# Patient Record
Sex: Male | Born: 1950
Health system: Southern US, Community
[De-identification: ages and names within clinical notes are randomized; demographics above are authoritative.]

## PROBLEM LIST (undated history)

## (undated) DIAGNOSIS — E785 Hyperlipidemia, unspecified: Secondary | ICD-10-CM

## (undated) DIAGNOSIS — A159 Respiratory tuberculosis unspecified: Secondary | ICD-10-CM

## (undated) DIAGNOSIS — K219 Gastro-esophageal reflux disease without esophagitis: Secondary | ICD-10-CM

## (undated) DIAGNOSIS — F1910 Other psychoactive substance abuse, uncomplicated: Secondary | ICD-10-CM

## (undated) DIAGNOSIS — I2510 Atherosclerotic heart disease of native coronary artery without angina pectoris: Secondary | ICD-10-CM

## (undated) HISTORY — PX: NO PAST SURGERIES: SHX2092

## (undated) HISTORY — DX: Hyperlipidemia, unspecified: E78.5

## (undated) HISTORY — DX: Other psychoactive substance abuse, uncomplicated: F19.10

## (undated) HISTORY — DX: Gastro-esophageal reflux disease without esophagitis: K21.9

## (undated) HISTORY — DX: Respiratory tuberculosis unspecified: A15.9

---

## 1999-11-24 ENCOUNTER — Emergency Department (HOSPITAL_COMMUNITY): Admission: EM | Admit: 1999-11-24 | Discharge: 1999-11-24 | Payer: Self-pay | Admitting: Emergency Medicine

## 2013-07-17 ENCOUNTER — Emergency Department (HOSPITAL_COMMUNITY)
Admission: EM | Admit: 2013-07-17 | Discharge: 2013-07-17 | Disposition: A | Discharge status: Home / Self Care | Payer: Self-pay | Attending: Emergency Medicine | Admitting: Emergency Medicine

## 2013-07-17 ENCOUNTER — Encounter (HOSPITAL_COMMUNITY): Payer: Self-pay | Admitting: Emergency Medicine

## 2013-07-17 DIAGNOSIS — R369 Urethral discharge, unspecified: Secondary | ICD-10-CM | POA: Insufficient documentation

## 2013-07-17 DIAGNOSIS — Z880 Allergy status to penicillin: Secondary | ICD-10-CM | POA: Insufficient documentation

## 2013-07-17 MED ORDER — STERILE WATER FOR INJECTION IJ SOLN
INTRAMUSCULAR | Status: AC
  Administered 2013-07-17: 0.9 mL
  Filled 2013-07-17: qty 10

## 2013-07-17 MED ORDER — AZITHROMYCIN 250 MG PO TABS
1000.0000 mg | ORAL_TABLET | Freq: Once | ORAL | Status: AC
  Administered 2013-07-17: 1000 mg via ORAL
  Filled 2013-07-17: qty 4

## 2013-07-17 MED ORDER — CEFTRIAXONE SODIUM 250 MG IJ SOLR
250.0000 mg | Freq: Once | INTRAMUSCULAR | Status: AC
  Administered 2013-07-17: 250 mg via INTRAMUSCULAR
  Filled 2013-07-17: qty 250

## 2013-07-17 NOTE — ED Notes (Signed)
Pt here for penile discharge that was clear starting thursday

## 2013-07-17 NOTE — ED Provider Notes (Signed)
CSN: [id number]     Arrival date & time 07/17/13  1747 History  This chart was scribed for non-physician practitioner, Trixie Dredge, PA-C,working with Bonnita Levan. Bernette Mayers, MD, by Karle Plumber, ED Scribe.  This patient was seen in room TR11C/TR11C and the patient's care was started at 6:30 PM.  Chief Complaint  Patient presents with  . Exposure to STD   The history is provided by the patient. No language interpreter was used.   HPI Comments:  Peter Taylor is a 62 y.o. male who presents to the Emergency Department complaining of penile discharge he noticed upon going to the bathroom two days ago. He denies seeing any lesions or sores of the penis. Pt denies any testicular pain or swelling. He denies abdominal pain, nausea, vomiting or fever. Pt reports his last HIV test was approximately 4 months ago.  Does get regular STD and HIV tests.   History reviewed. No pertinent past medical history. History reviewed. No pertinent past surgical history. History reviewed. No pertinent family history. History  Substance Use Topics  . Smoking status: Never Smoker   . Smokeless tobacco: Not on file  . Alcohol Use: Yes     Comment: occ    Review of Systems  Constitutional: Negative for fever and chills.  Gastrointestinal: Negative for nausea, vomiting and abdominal pain.  Genitourinary: Positive for discharge. Negative for penile swelling, scrotal swelling, genital sores, penile pain and testicular pain.    Allergies  Penicillins  Home Medications  No current outpatient prescriptions on file. Triage Vitals: BP 137/86  Pulse 62  Temp(Src) 98.1 F (36.7 C) (Oral)  Resp 18  SpO2 99% Physical Exam  Nursing note and vitals reviewed. Constitutional: He appears well-developed and well-nourished. No distress.  HENT:  Head: Normocephalic and atraumatic.  Neck: Neck supple.  Pulmonary/Chest: Effort normal.  Genitourinary: Testes normal. Right testis shows no mass, no swelling and no  tenderness. Right testis is descended. Left testis shows no mass, no swelling and no tenderness. Left testis is descended. Uncircumcised. Discharge found.  Neurological: He is alert.  Skin: He is not diaphoretic.    ED Course  Procedures (including critical care time) DIAGNOSTIC STUDIES: Oxygen Saturation is 99% on RA, normal by my interpretation.   COORDINATION OF CARE: 6:35 PM- Will obtain labs and treat with antibiotics for possible gonorrhea exposure. Pt verbalizes understanding and agrees to plan.  Medications - No data to display  Labs Review Labs Reviewed - No data to display Imaging Review No results found.  MDM   1. Penile discharge     Pt with 2 days of penile discharge.  No other symptoms.  Pt chooses to undergo full STD panel and to be treated in ED for GC/Chlam.  He verbalizes understanding that all tests will be pending at discharge.  Pt given return precautions.  Pt verbalizes understanding and agrees with plan.     I personally performed the services described in this documentation, which was scribed in my presence. The recorded information has been reviewed and is accurate.    Trixie Dredge, PA-C 07/17/13 2045

## 2013-07-18 LAB — HIV ANTIBODY (ROUTINE TESTING W REFLEX): HIV: NONREACTIVE

## 2013-07-18 LAB — GC/CHLAMYDIA PROBE AMP: GC Probe RNA: NEGATIVE

## 2013-07-18 NOTE — ED Provider Notes (Signed)
Medical screening examination/treatment/procedure(s) were performed by non-physician practitioner and as supervising physician I was immediately available for consultation/collaboration.   Maurisio Ruddy B. Geoffrey Hynes, MD 07/18/13 1335 

## 2013-07-23 ENCOUNTER — Telehealth (HOSPITAL_COMMUNITY): Payer: Self-pay | Admitting: Emergency Medicine

## 2013-07-23 NOTE — ED Notes (Signed)
Pt called for lab results, verified ID by ss#. All test neg. He states he is still having some problems. Advised to follow up with PCP or return for further evaluation.

## 2019-11-03 ENCOUNTER — Encounter: Payer: Self-pay | Admitting: Family Medicine

## 2019-11-03 ENCOUNTER — Other Ambulatory Visit: Payer: Self-pay

## 2019-11-03 ENCOUNTER — Ambulatory Visit (INDEPENDENT_AMBULATORY_CARE_PROVIDER_SITE_OTHER): Payer: Medicare HMO | Admitting: Family Medicine

## 2019-11-03 ENCOUNTER — Ambulatory Visit (INDEPENDENT_AMBULATORY_CARE_PROVIDER_SITE_OTHER): Payer: Medicare HMO

## 2019-11-03 VITALS — BP 140/80 | HR 68 | Temp 98.2°F | Wt 195.8 lb

## 2019-11-03 DIAGNOSIS — R059 Cough, unspecified: Secondary | ICD-10-CM

## 2019-11-03 DIAGNOSIS — R0600 Dyspnea, unspecified: Secondary | ICD-10-CM

## 2019-11-03 DIAGNOSIS — R12 Heartburn: Secondary | ICD-10-CM | POA: Diagnosis not present

## 2019-11-03 DIAGNOSIS — R079 Chest pain, unspecified: Secondary | ICD-10-CM | POA: Diagnosis not present

## 2019-11-03 DIAGNOSIS — R1084 Generalized abdominal pain: Secondary | ICD-10-CM | POA: Diagnosis not present

## 2019-11-03 DIAGNOSIS — R05 Cough: Secondary | ICD-10-CM | POA: Diagnosis not present

## 2019-11-03 DIAGNOSIS — R0609 Other forms of dyspnea: Secondary | ICD-10-CM

## 2019-11-03 DIAGNOSIS — R0789 Other chest pain: Secondary | ICD-10-CM | POA: Diagnosis not present

## 2019-11-03 MED ORDER — OMEPRAZOLE 20 MG PO CPDR: 20.0000 mg | DELAYED_RELEASE_CAPSULE | Freq: Every day | ORAL | 1 refills | Status: DC

## 2019-11-03 NOTE — Patient Instructions (Addendum)
For heartburn symptoms, see information below on foods to avoid.  Start omeprazole once per day.  Avoid exertional exercise at this time until we evaluate your chest symptoms further.  Tylenol is okay if needed for chest wall pain.  See other information below.   If any acute worsening of symptoms call 911 or go to emergency room.  Recheck in 1 week to review labs and discuss next step.  I did refer you to cardiology to discuss your chest symptoms and shortness of breath as well. Return to the clinic or go to the nearest emergency room if any of your symptoms worsen or new symptoms occur.   Food Choices for Gastroesophageal Reflux Disease, Adult When you have gastroesophageal reflux disease (GERD), the foods you eat and your eating habits are very important. Choosing the right foods can help ease your discomfort. Think about working with a nutrition specialist (dietitian) to help you make good choices. What are tips for following this plan?  Meals  Choose healthy foods that are low in fat, such as fruits, vegetables, whole grains, low-fat dairy products, and lean meat, fish, and poultry.  Eat small meals often instead of 3 large meals a day. Eat your meals slowly, and in a place where you are relaxed. Avoid bending over or lying down until 2-3 hours after eating.  Avoid eating meals 2-3 hours before bed.  Avoid drinking a lot of liquid with meals.  Cook foods using methods other than frying. Bake, grill, or broil food instead.  Avoid or limit: ? Chocolate. ? Peppermint or spearmint. ? Alcohol. ? Pepper. ? Black and decaffeinated coffee. ? Black and decaffeinated tea. ? Bubbly (carbonated) soft drinks. ? Caffeinated energy drinks and soft drinks.  Limit high-fat foods such as: ? Fatty meat or fried foods. ? Whole milk, cream, butter, or ice cream. ? Nuts and nut butters. ? Pastries, donuts, and sweets made with butter or shortening.  Avoid foods that cause symptoms. These foods  may be different for everyone. Common foods that cause symptoms include: ? Tomatoes. ? Oranges, lemons, and limes. ? Peppers. ? Spicy food. ? Onions and garlic. ? Vinegar. Lifestyle  Maintain a healthy weight. Ask your doctor what weight is healthy for you. If you need to lose weight, work with your doctor to do so safely.  Exercise for at least 30 minutes for 5 or more days each week, or as told by your doctor.  Wear loose-fitting clothes.  Do not smoke. If you need help quitting, ask your doctor.  Sleep with the head of your bed higher than your feet. Use a wedge under the mattress or blocks under the bed frame to raise the head of the bed. Summary  When you have gastroesophageal reflux disease (GERD), food and lifestyle choices are very important in easing your symptoms.  Eat small meals often instead of 3 large meals a day. Eat your meals slowly, and in a place where you are relaxed.  Limit high-fat foods such as fatty meat or fried foods.  Avoid bending over or lying down until 2-3 hours after eating.  Avoid peppermint and spearmint, caffeine, alcohol, and chocolate. This information is not intended to replace advice given to you by your health care provider. Make sure you discuss any questions you have with your health care provider. Document Revised: 02/04/2019 Document Reviewed: 11/19/2016 Elsevier Patient Education  [place].  Nonspecific Chest Pain Chest pain can be caused by many different conditions. Some causes of chest  pain can be life-threatening. These will require treatment right away. Serious causes of chest pain include:  Heart attack.  A tear in the body's main blood vessel.  Redness and swelling (inflammation) around your heart.  Blood clot in your lungs. Other causes of chest pain may not be so serious. These include:  Heartburn.  Anxiety or stress.  Damage to bones or muscles in your chest.  Lung infections. Chest pain can feel  like:  Pain or discomfort in your chest.  Crushing, pressure, aching, or squeezing pain.  Burning or tingling.  Dull or sharp pain that is worse when you move, cough, or take a deep breath.  Pain or discomfort that is also felt in your back, neck, jaw, shoulder, or arm, or pain that spreads to any of these areas. It is hard to know whether your pain is caused by something that is serious or something that is not so serious. So it is important to see your doctor right away if you have chest pain. Follow these instructions at home: Medicines  Take over-the-counter and prescription medicines only as told by your doctor.  If you were prescribed an antibiotic medicine, take it as told by your doctor. Do not stop taking the antibiotic even if you start to feel better. Lifestyle   Rest as told by your doctor.  Do not use any products that contain nicotine or tobacco, such as cigarettes, e-cigarettes, and chewing tobacco. If you need help quitting, ask your doctor.  Do not drink alcohol.  Make lifestyle changes as told by your doctor. These may include: ? Getting regular exercise. Ask your doctor what activities are safe for you. ? Eating a heart-healthy diet. A diet and nutrition specialist (dietitian) can help you to learn healthy eating options. ? Staying at a healthy weight. ? Treating diabetes or high blood pressure, if needed. ? Lowering your stress. Activities such as yoga and relaxation techniques can help. General instructions  Pay attention to any changes in your symptoms. Tell your doctor about them or any new symptoms.  Avoid any activities that cause chest pain.  Keep all follow-up visits as told by your doctor. This is important. You may need more testing if your chest pain does not go away. Contact a doctor if:  Your chest pain does not go away.  You feel depressed.  You have a fever. Get help right away if:  Your chest pain is worse.  You have a cough that  gets worse, or you cough up blood.  You have very bad (severe) pain in your belly (abdomen).  You pass out (faint).  You have either of these for no clear reason: ? Sudden chest discomfort. ? Sudden discomfort in your arms, back, neck, or jaw.  You have shortness of breath at any time.  You suddenly start to sweat, or your skin gets clammy.  You feel sick to your stomach (nauseous).  You throw up (vomit).  You suddenly feel lightheaded or dizzy.  You feel very weak or tired.  Your heart starts to beat fast, or it feels like it is skipping beats. These symptoms may be an emergency. Do not wait to see if the symptoms will go away. Get medical help right away. Call your local emergency services (911 in the [place] Do not drive yourself to the hospital. Summary  Chest pain can be caused by many different conditions. The cause may be serious and need treatment right away. If you have chest pain, see  your doctor right away.  Follow your doctor's instructions for taking medicines and making lifestyle changes.  Keep all follow-up visits as told by your doctor. This includes visits for any further testing if your chest pain does not go away.  Be sure to know the signs that show that your condition has become worse. Get help right away if you have these symptoms. This information is not intended to replace advice given to you by your health care provider. Make sure you discuss any questions you have with your health care provider. Document Revised: 04/16/2018 Document Reviewed: 04/16/2018 Elsevier Patient Education  [place].  Chest Wall Pain Chest wall pain is pain in or around the bones and muscles of your chest. Sometimes, an injury causes this pain. Excessive coughing or overuse of arm and chest muscles may also cause chest wall pain. Sometimes, the cause may not be known. This pain may take several weeks or longer to get better. Follow these instructions at home: Managing  pain, stiffness, and swelling   If directed, put ice on the painful area: ? Put ice in a plastic bag. ? Place a towel between your skin and the bag. ? Leave the ice on for 20 minutes, 2-3 times per day. Activity  Rest as told by your health care provider.  Avoid activities that cause pain. These include any activities that use your chest muscles or your abdominal and side muscles to lift heavy items. Ask your health care provider what activities are safe for you. General instructions   Take over-the-counter and prescription medicines only as told by your health care provider.  Do not use any products that contain nicotine or tobacco, such as cigarettes, e-cigarettes, and chewing tobacco. These can delay healing after injury. If you need help quitting, ask your health care provider.  Keep all follow-up visits as told by your health care provider. This is important. Contact a health care provider if:  You have a fever.  Your chest pain becomes worse.  You have new symptoms. Get help right away if:  You have nausea or vomiting.  You feel sweaty or light-headed.  You have a cough with mucus from your lungs (sputum) or you cough up blood.  You develop shortness of breath. These symptoms may represent a serious problem that is an emergency. Do not wait to see if the symptoms will go away. Get medical help right away. Call your local emergency services (911 in the [place] Do not drive yourself to the hospital. Summary  Chest wall pain is pain in or around the bones and muscles of your chest.  Depending on the cause, it may be treated with ice, rest, medicines, and avoiding activities that cause pain.  Contact a health care provider if you have a fever, worsening chest pain, or new symptoms.  Get help right away if you feel light-headed or you develop shortness of breath. These symptoms may be an emergency. This information is not intended to replace advice given to you by your  health care provider. Make sure you discuss any questions you have with your health care provider. Document Revised: 04/16/2018 Document Reviewed: 04/16/2018 Elsevier Patient Education  [employer].   If you have lab work done today you will be contacted with your lab results within the next 2 weeks.  If you have not heard from [place] then please contact us. The fastest way to get your results is to register for My Chart.   IF you received  an x-ray today, you will receive an invoice from [facility]. Please contact [facility] at [phone] with questions or concerns regarding your invoice.   IF you received labwork today, you will receive an invoice from [facility]. Please contact [facility] at [phone] with questions or concerns regarding your invoice.   Our billing staff will not be able to assist you with questions regarding bills from these companies.  You will be contacted with the lab results as soon as they are available. The fastest way to get your results is to activate your My Chart account. Instructions are located on the last page of this paperwork. If you have not heard from [place] regarding the results in 2 weeks, please contact this office.

## 2019-11-03 NOTE — Progress Notes (Signed)
Subjective:  Patient ID: Peter Taylor, male    DOB: 1951-10-17  Age: 69 y.o. MRN: [id number]  CC:  Chief Complaint  Patient presents with  . Establish Care    est care and to talk about the acid reflex I have been having.Been going on for 3 weeks now. Started taking otc omeprazol and have not been having any problems but just want to mention this. Patient only want the hiv amd hep if he have to have labs done today    HPI Peter Taylor presents for   Establish care and reflux/heartburn  Heartburn/reflux: Past month noted more, episodic in past. Persistent past month. Sore on front of chest to left side of chest. Increased belching- notes after antacid tablet - rolaids - usually about 3 per day.  Notes problem with any food or liquid - sore moving to left chest area after eating/drinking. No chest pressure. No pain with exertion.  Some DOE - month ago - woke up short of breath in the middle of night, no recurrence. Some DOE at times with activity at work, like operating hand crank or bending down, not every time.  No associated sweating, but lightheaded with DOE at times.  Rare cough - occasional clear mucus. No known hx of COPD, asthma, CAD.  Dyspnea if lying on left side only.  No dark stools/BRBPR.   Quit smoking 7 years ago (about 1/2ppd for 54 years - 27pack yr hx).  Alcohol: beer on holidays only - 2-3 per month. No recent increase - less alcohol.  Truck driver - local.  No calf pain/swelling, no hx of DVT/PE.   No new urinary symptoms.  He has taken over-the-counter prostate supplement for the past 3 years.   History There are no problems to display for this patient.  History reviewed. No pertinent past medical history. History reviewed. No pertinent surgical history. Allergies  Allergen Reactions  . Penicillins Hives   Prior to Admission medications   Not on File   Social History   Socioeconomic History  . Marital status: Single    Spouse name: Not on  file  . Number of children: Not on file  . Years of education: Not on file  . Highest education level: Not on file  Occupational History  . Not on file  Tobacco Use  . Smoking status: Former Research scientist (life sciences)  . Smokeless tobacco: Former Network engineer and Sexual Activity  . Alcohol use: Yes    Comment: occ  . Drug use: No  . Sexual activity: Not on file  Other Topics Concern  . Not on file  Social History Narrative  . Not on file   Social Determinants of Health   Financial Resource Strain:   . Difficulty of Paying Living Expenses: Not on file  Food Insecurity:   . Worried About Charity fundraiser in the Last Year: Not on file  . Ran Out of Food in the Last Year: Not on file  Transportation Needs:   . Lack of Transportation (Medical): Not on file  . Lack of Transportation (Non-Medical): Not on file  Physical Activity:   . Days of Exercise per Week: Not on file  . Minutes of Exercise per Session: Not on file  Stress:   . Feeling of Stress : Not on file  Social Connections:   . Frequency of Communication with Friends and Family: Not on file  . Frequency of Social Gatherings with Friends and Family: Not on file  .  Attends Religious Services: Not on file  . Active Member of Clubs or Organizations: Not on file  . Attends Archivist Meetings: Not on file  . Marital Status: Not on file  Intimate Partner Violence:   . Fear of Current or Ex-Partner: Not on file  . Emotionally Abused: Not on file  . Physically Abused: Not on file  . Sexually Abused: Not on file    Review of Systems  Constitutional: Negative for diaphoresis, fever and unexpected weight change.  Respiratory: Positive for cough and shortness of breath.   Cardiovascular: Positive for chest pain. Negative for leg swelling (no calf pain/swelling ).   Other per HPI  Objective:   Vitals:   11/03/19 1324 11/03/19 1331  BP: (!) 141/85 140/80  Pulse: 68   Temp: 98.2 F (36.8 C)   TempSrc: Temporal   SpO2:  97%   Weight: 195 lb 12.8 oz (88.8 kg)      Physical Exam Vitals reviewed.  Constitutional:      Appearance: He is well-developed.  HENT:     Head: Normocephalic and atraumatic.  Eyes:     Pupils: Pupils are equal, round, and reactive to light.  Neck:     Vascular: No carotid bruit or JVD.  Cardiovascular:     Rate and Rhythm: Normal rate and regular rhythm.     Heart sounds: Normal heart sounds. No murmur.  Pulmonary:     Effort: Pulmonary effort is normal. No respiratory distress.     Breath sounds: Normal breath sounds. No wheezing or rales.  Chest:     Chest wall: Tenderness present.    Abdominal:     General: Abdomen is flat.     Palpations: Abdomen is soft.     Tenderness: There is abdominal tenderness (Minimal/slight tenderness diffusely, no rebound/guarding.). There is no guarding or rebound.  Skin:    General: Skin is warm and dry.  Neurological:     Mental Status: He is alert and oriented to person, place, and time.   DG Chest 2 View  Result Date: 11/03/2019 CLINICAL DATA:  Dyspnea on exertion, anterior chest wall pain EXAM: CHEST - 2 VIEW COMPARISON:  None. FINDINGS: The heart size and mediastinal contours are within normal limits. Both lungs are clear. The visualized skeletal structures are unremarkable. IMPRESSION: No acute abnormality of the lungs. No radiographic findings to explain the anterior chest wall pain. Electronically Signed   By: Giovonnie Candle M.D.   On: 11/03/2019 14:26   EKG:  Sinus bradycardia. First degree AV block. PR 220. No prior comparison.   Assessment & Plan:  Peter Taylor is a 69 y.o. male . Chest wall pain - Plan: Ambulatory referral to Cardiology Chest pain, unspecified type - Plan: EKG 12-Lead, Ambulatory referral to Cardiology PND (paroxysmal nocturnal dyspnea) - Plan: EKG 12-Lead Cough - Plan: DG Chest 2 View DOE (dyspnea on exertion) - Plan: EKG 12-Lead, DG Chest 2 View, Pro b natriuretic peptide, Ambulatory referral to  Cardiology Heartburn - Plan: omeprazole (PRILOSEC) 20 MG capsule   - atypical chest symptoms as well as chest wall component. Single episode of PND. No acute EKG findings. Reassuring CXR. May have GERD component as well.   - trigger avoidance for GERD., start omeprazole.   - refer to cardiology.   -- avoid exertion for now until cardiology eval. Er/rtc chest pain  precautions.   Generalized abdominal pain - Plan: CBC, Comprehensive metabolic panel, Lipase  - afebrile. Check labs above, start omeprazole.  recheck 1 week, sooner or to ER if worse.   Meds ordered this encounter  Medications  . omeprazole (PRILOSEC) 20 MG capsule    Sig: Take 1 capsule (20 mg total) by mouth daily.    Dispense:  30 capsule    Refill:  1   Patient Instructions    For heartburn symptoms, see information below on foods to avoid.  Start omeprazole once per day.  Avoid exertional exercise at this time until we evaluate your chest symptoms further.  Tylenol is okay if needed for chest wall pain.  See other information below.   If any acute worsening of symptoms call 911 or go to emergency room.  Recheck in 1 week to review labs and discuss next step.  I did refer you to cardiology to discuss your chest symptoms and shortness of breath as well. Return to the clinic or go to the nearest emergency room if any of your symptoms worsen or new symptoms occur.   Food Choices for Gastroesophageal Reflux Disease, Adult When you have gastroesophageal reflux disease (GERD), the foods you eat and your eating habits are very important. Choosing the right foods can help ease your discomfort. Think about working with a nutrition specialist (dietitian) to help you make good choices. What are tips for following this plan?  Meals  Choose healthy foods that are low in fat, such as fruits, vegetables, whole grains, low-fat dairy products, and lean meat, fish, and poultry.  Eat small meals often instead of 3 large meals a day. Eat  your meals slowly, and in a place where you are relaxed. Avoid bending over or lying down until 2-3 hours after eating.  Avoid eating meals 2-3 hours before bed.  Avoid drinking a lot of liquid with meals.  Cook foods using methods other than frying. Bake, grill, or broil food instead.  Avoid or limit: ? Chocolate. ? Peppermint or spearmint. ? Alcohol. ? Pepper. ? Black and decaffeinated coffee. ? Black and decaffeinated tea. ? Bubbly (carbonated) soft drinks. ? Caffeinated energy drinks and soft drinks.  Limit high-fat foods such as: ? Fatty meat or fried foods. ? Whole milk, cream, butter, or ice cream. ? Nuts and nut butters. ? Pastries, donuts, and sweets made with butter or shortening.  Avoid foods that cause symptoms. These foods may be different for everyone. Common foods that cause symptoms include: ? Tomatoes. ? Oranges, lemons, and limes. ? Peppers. ? Spicy food. ? Onions and garlic. ? Vinegar. Lifestyle  Maintain a healthy weight. Ask your doctor what weight is healthy for you. If you need to lose weight, work with your doctor to do so safely.  Exercise for at least 30 minutes for 5 or more days each week, or as told by your doctor.  Wear loose-fitting clothes.  Do not smoke. If you need help quitting, ask your doctor.  Sleep with the head of your bed higher than your feet. Use a wedge under the mattress or blocks under the bed frame to raise the head of the bed. Summary  When you have gastroesophageal reflux disease (GERD), food and lifestyle choices are very important in easing your symptoms.  Eat small meals often instead of 3 large meals a day. Eat your meals slowly, and in a place where you are relaxed.  Limit high-fat foods such as fatty meat or fried foods.  Avoid bending over or lying down until 2-3 hours after eating.  Avoid peppermint and spearmint, caffeine, alcohol, and chocolate. This information  is not intended to replace advice given to  you by your health care provider. Make sure you discuss any questions you have with your health care provider. Document Revised: 02/04/2019 Document Reviewed: 11/19/2016 Elsevier Patient Education  [place].  Nonspecific Chest Pain Chest pain can be caused by many different conditions. Some causes of chest pain can be life-threatening. These will require treatment right away. Serious causes of chest pain include:  Heart attack.  A tear in the body's main blood vessel.  Redness and swelling (inflammation) around your heart.  Blood clot in your lungs. Other causes of chest pain may not be so serious. These include:  Heartburn.  Anxiety or stress.  Damage to bones or muscles in your chest.  Lung infections. Chest pain can feel like:  Pain or discomfort in your chest.  Crushing, pressure, aching, or squeezing pain.  Burning or tingling.  Dull or sharp pain that is worse when you move, cough, or take a deep breath.  Pain or discomfort that is also felt in your back, neck, jaw, shoulder, or arm, or pain that spreads to any of these areas. It is hard to know whether your pain is caused by something that is serious or something that is not so serious. So it is important to see your doctor right away if you have chest pain. Follow these instructions at home: Medicines  Take over-the-counter and prescription medicines only as told by your doctor.  If you were prescribed an antibiotic medicine, take it as told by your doctor. Do not stop taking the antibiotic even if you start to feel better. Lifestyle   Rest as told by your doctor.  Do not use any products that contain nicotine or tobacco, such as cigarettes, e-cigarettes, and chewing tobacco. If you need help quitting, ask your doctor.  Do not drink alcohol.  Make lifestyle changes as told by your doctor. These may include: ? Getting regular exercise. Ask your doctor what activities are safe for you. ? Eating a  heart-healthy diet. A diet and nutrition specialist (dietitian) can help you to learn healthy eating options. ? Staying at a healthy weight. ? Treating diabetes or high blood pressure, if needed. ? Lowering your stress. Activities such as yoga and relaxation techniques can help. General instructions  Pay attention to any changes in your symptoms. Tell your doctor about them or any new symptoms.  Avoid any activities that cause chest pain.  Keep all follow-up visits as told by your doctor. This is important. You may need more testing if your chest pain does not go away. Contact a doctor if:  Your chest pain does not go away.  You feel depressed.  You have a fever. Get help right away if:  Your chest pain is worse.  You have a cough that gets worse, or you cough up blood.  You have very bad (severe) pain in your belly (abdomen).  You pass out (faint).  You have either of these for no clear reason: ? Sudden chest discomfort. ? Sudden discomfort in your arms, back, neck, or jaw.  You have shortness of breath at any time.  You suddenly start to sweat, or your skin gets clammy.  You feel sick to your stomach (nauseous).  You throw up (vomit).  You suddenly feel lightheaded or dizzy.  You feel very weak or tired.  Your heart starts to beat fast, or it feels like it is skipping beats. These symptoms may be an emergency. Do not  wait to see if the symptoms will go away. Get medical help right away. Call your local emergency services (911 in the [place] Do not drive yourself to the hospital. Summary  Chest pain can be caused by many different conditions. The cause may be serious and need treatment right away. If you have chest pain, see your doctor right away.  Follow your doctor's instructions for taking medicines and making lifestyle changes.  Keep all follow-up visits as told by your doctor. This includes visits for any further testing if your chest pain does not go  away.  Be sure to know the signs that show that your condition has become worse. Get help right away if you have these symptoms. This information is not intended to replace advice given to you by your health care provider. Make sure you discuss any questions you have with your health care provider. Document Revised: 04/16/2018 Document Reviewed: 04/16/2018 Elsevier Patient Education  [place].  Chest Wall Pain Chest wall pain is pain in or around the bones and muscles of your chest. Sometimes, an injury causes this pain. Excessive coughing or overuse of arm and chest muscles may also cause chest wall pain. Sometimes, the cause may not be known. This pain may take several weeks or longer to get better. Follow these instructions at home: Managing pain, stiffness, and swelling   If directed, put ice on the painful area: ? Put ice in a plastic bag. ? Place a towel between your skin and the bag. ? Leave the ice on for 20 minutes, 2-3 times per day. Activity  Rest as told by your health care provider.  Avoid activities that cause pain. These include any activities that use your chest muscles or your abdominal and side muscles to lift heavy items. Ask your health care provider what activities are safe for you. General instructions   Take over-the-counter and prescription medicines only as told by your health care provider.  Do not use any products that contain nicotine or tobacco, such as cigarettes, e-cigarettes, and chewing tobacco. These can delay healing after injury. If you need help quitting, ask your health care provider.  Keep all follow-up visits as told by your health care provider. This is important. Contact a health care provider if:  You have a fever.  Your chest pain becomes worse.  You have new symptoms. Get help right away if:  You have nausea or vomiting.  You feel sweaty or light-headed.  You have a cough with mucus from your lungs (sputum) or you cough  up blood.  You develop shortness of breath. These symptoms may represent a serious problem that is an emergency. Do not wait to see if the symptoms will go away. Get medical help right away. Call your local emergency services (911 in the [place] Do not drive yourself to the hospital. Summary  Chest wall pain is pain in or around the bones and muscles of your chest.  Depending on the cause, it may be treated with ice, rest, medicines, and avoiding activities that cause pain.  Contact a health care provider if you have a fever, worsening chest pain, or new symptoms.  Get help right away if you feel light-headed or you develop shortness of breath. These symptoms may be an emergency. This information is not intended to replace advice given to you by your health care provider. Make sure you discuss any questions you have with your health care provider. Document Revised: 04/16/2018 Document Reviewed: 04/16/2018 Elsevier Patient  Education  [employer].   If you have lab work done today you will be contacted with your lab results within the next 2 weeks.  If you have not heard from [place] then please contact us. The fastest way to get your results is to register for My Chart.   IF you received an x-ray today, you will receive an invoice from [facility]. Please contact [facility] at [phone] with questions or concerns regarding your invoice.   IF you received labwork today, you will receive an invoice from [place]. Please contact [facility] at [phone] with questions or concerns regarding your invoice.   Our billing staff will not be able to assist you with questions regarding bills from these companies.  You will be contacted with the lab results as soon as they are available. The fastest way to get your results is to activate your My Chart account. Instructions are located on the last page of this paperwork. If you have not heard from [place] regarding the results in 2  weeks, please contact this office.          Signed, Merri Ray, MD [facility]

## 2019-11-04 LAB — COMPREHENSIVE METABOLIC PANEL
ALT: 31 IU/L (ref 0–44)
AST: 17 IU/L (ref 0–40)
Albumin/Globulin Ratio: 1.7 (ref 1.2–2.2)
Albumin: 4.5 g/dL (ref 3.8–4.8)
Alkaline Phosphatase: 110 IU/L (ref 39–117)
BUN/Creatinine Ratio: 15 (ref 10–24)
BUN: 17 mg/dL (ref 8–27)
Bilirubin Total: 0.3 mg/dL (ref 0.0–1.2)
CO2: 25 mmol/L (ref 20–29)
Calcium: 9.8 mg/dL (ref 8.6–10.2)
Chloride: 105 mmol/L (ref 96–106)
Creatinine, Ser: 1.14 mg/dL (ref 0.76–1.27)
GFR calc Af Amer: 77 mL/min/{1.73_m2} (ref >59)
GFR calc non Af Amer: 67 mL/min/{1.73_m2} (ref >59)
Globulin, Total: 2.7 g/dL (ref 1.5–4.5)
Glucose: 107 mg/dL — ABNORMAL HIGH (ref 65–99)
Potassium: 4.2 mmol/L (ref 3.5–5.2)
Sodium: 145 mmol/L — ABNORMAL HIGH (ref 134–144)
Total Protein: 7.2 g/dL (ref 6.0–8.5)

## 2019-11-04 LAB — CBC
Hematocrit: 45.5 % (ref 37.5–51.0)
Hemoglobin: 15.4 g/dL (ref 13.0–17.7)
MCH: 29.6 pg (ref 26.6–33.0)
MCHC: 33.8 g/dL (ref 31.5–35.7)
MCV: 88 fL (ref 79–97)
Platelets: 267 10*3/uL (ref 150–450)
RBC: 5.2 x10E6/uL (ref 4.14–5.80)
RDW: 14.6 % (ref 11.6–15.4)
WBC: 5.5 10*3/uL (ref 3.4–10.8)

## 2019-11-04 LAB — LIPASE: Lipase: 102 U/L — ABNORMAL HIGH (ref 13–78)

## 2019-11-04 LAB — PRO B NATRIURETIC PEPTIDE: NT-Pro BNP: <5 pg/mL (ref 0–376)

## 2019-11-05 ENCOUNTER — Encounter: Payer: Self-pay | Admitting: Family Medicine

## 2019-11-10 ENCOUNTER — Ambulatory Visit (INDEPENDENT_AMBULATORY_CARE_PROVIDER_SITE_OTHER): Payer: Medicare HMO | Admitting: Family Medicine

## 2019-11-10 ENCOUNTER — Other Ambulatory Visit: Payer: Self-pay

## 2019-11-10 ENCOUNTER — Encounter: Payer: Self-pay | Admitting: Family Medicine

## 2019-11-10 VITALS — BP 131/85 | HR 52 | Temp 97.6°F | Ht 71.0 in | Wt 194.0 lb

## 2019-11-10 DIAGNOSIS — R12 Heartburn: Secondary | ICD-10-CM | POA: Diagnosis not present

## 2019-11-10 DIAGNOSIS — Z125 Encounter for screening for malignant neoplasm of prostate: Secondary | ICD-10-CM | POA: Diagnosis not present

## 2019-11-10 DIAGNOSIS — R1084 Generalized abdominal pain: Secondary | ICD-10-CM

## 2019-11-10 DIAGNOSIS — K625 Hemorrhage of anus and rectum: Secondary | ICD-10-CM

## 2019-11-10 DIAGNOSIS — R0789 Other chest pain: Secondary | ICD-10-CM | POA: Diagnosis not present

## 2019-11-10 DIAGNOSIS — R3989 Other symptoms and signs involving the genitourinary system: Secondary | ICD-10-CM | POA: Diagnosis not present

## 2019-11-10 DIAGNOSIS — R748 Abnormal levels of other serum enzymes: Secondary | ICD-10-CM | POA: Diagnosis not present

## 2019-11-10 DIAGNOSIS — Z8042 Family history of malignant neoplasm of prostate: Secondary | ICD-10-CM | POA: Diagnosis not present

## 2019-11-10 DIAGNOSIS — Z13220 Encounter for screening for lipoid disorders: Secondary | ICD-10-CM | POA: Diagnosis not present

## 2019-11-10 DIAGNOSIS — R351 Nocturia: Secondary | ICD-10-CM

## 2019-11-10 DIAGNOSIS — Z1211 Encounter for screening for malignant neoplasm of colon: Secondary | ICD-10-CM

## 2019-11-10 LAB — IFOBT (OCCULT BLOOD): IFOBT: NEGATIVE

## 2019-11-10 NOTE — Patient Instructions (Addendum)
  I will recheck lipase or pancreas test.  If any worsening abdominal pain, fevers, nausea or vomiting be seen in the emergency room.  Keep follow-up with cardiology as planned  I will refer you to urology to evaluate the prostate symptoms and for repeat check of the prostate exam as there is an area that feels enlarged/firm.  I am checking a PSA level as well today.  I will refer you to gastroenterology.  Continue omeprazole once per day for now, recheck 1 month.  Return to the clinic or go to the nearest emergency room if any of your symptoms worsen or new symptoms occur.    If you have lab work done today you will be contacted with your lab results within the next 2 weeks.  If you have not heard from [place] then please contact us. The fastest way to get your results is to register for My Chart.   IF you received an x-ray today, you will receive an invoice from [facility]. Please contact [facility] at [phone] with questions or concerns regarding your invoice.   IF you received labwork today, you will receive an invoice from [place]. Please contact [facility] at [phone] with questions or concerns regarding your invoice.   Our billing staff will not be able to assist you with questions regarding bills from these companies.  You will be contacted with the lab results as soon as they are available. The fastest way to get your results is to activate your My Chart account. Instructions are located on the last page of this paperwork. If you have not heard from [place] regarding the results in 2 weeks, please contact this office.

## 2019-11-10 NOTE — Progress Notes (Signed)
Subjective:  Patient ID: Peter Taylor, male    DOB: Aug 13, 1951  Age: 69 y.o. MRN: [id number]  CC:  Chief Complaint  Patient presents with  . Follow-up    from last weeks visit. pt states he has had a big improvment since last visit. pt still has a little pain from acid refulx but not as bad as it was, no chest pain. pt has appt with cardiology tomorrow.  . Referral    pt needs a referral to GI for colonoscopy.    HPI Peter Taylor presents for   Chest wall pain: Discussed 1 week ago.  At that time did have a previous episode of paroxysmal nocturnal dyspnea, dyspnea exertion.  Atypical chest symptoms as well as chest wall component at that time.  No acute EKG findings, reassuring chest x-ray, thought to have possible GERD component.  Started on omeprazole 20 mg daily, cardiology referral placed, appt tomorrow.  Improving. Minimal acid reflux symptoms, taking prilosec daily.  Notes at times with eating - belching at times.   Abdominal pain: Start on omeprazole for heartburn symptoms, possible gastritis component.  Labs were obtained with slight elevation in lipase.  Reported during the holidays only, 2 to 3/month when we discussed last visit. No fever/n/v.  No alcohol recently.  No pain, but feels like it swells at times when eating.  ? Possible small streak of blood 2 days only, no dark stools, no recurrence.  No weight loss, no night sweats.   Takes super beta prostate, working well. Past 3 years. Had issue with nocturia prior to supplement.  No hematuria. No recent PSA, Brother died with prostate cancer at 25yo.     Results for orders placed or performed in visit on 11/10/19  IFOBT POC (occult bld, rslt in office, Hemosure)  Result Value Ref Range   IFOBT Negative      History There are no problems to display for this patient.  No past medical history on file. No past surgical history on file. Allergies  Allergen Reactions  . Penicillins Hives   Prior to  Admission medications   Medication Sig Start Date End Date Taking? Authorizing Provider  omeprazole (PRILOSEC) 20 MG capsule Take 1 capsule (20 mg total) by mouth daily. 11/03/19  Yes Wendie Agreste, MD   Social History   Socioeconomic History  . Marital status: Single    Spouse name: Not on file  . Number of children: Not on file  . Years of education: Not on file  . Highest education level: Not on file  Occupational History  . Not on file  Tobacco Use  . Smoking status: Former Research scientist (life sciences)  . Smokeless tobacco: Former Network engineer and Sexual Activity  . Alcohol use: Yes    Comment: occ  . Drug use: No  . Sexual activity: Not on file  Other Topics Concern  . Not on file  Social History Narrative  . Not on file   Social Determinants of Health   Financial Resource Strain:   . Difficulty of Paying Living Expenses: Not on file  Food Insecurity:   . Worried About Charity fundraiser in the Last Year: Not on file  . Ran Out of Food in the Last Year: Not on file  Transportation Needs:   . Lack of Transportation (Medical): Not on file  . Lack of Transportation (Non-Medical): Not on file  Physical Activity:   . Days of Exercise per Week: Not on file  .  Minutes of Exercise per Session: Not on file  Stress:   . Feeling of Stress : Not on file  Social Connections:   . Frequency of Communication with Friends and Family: Not on file  . Frequency of Social Gatherings with Friends and Family: Not on file  . Attends Religious Services: Not on file  . Active Member of Clubs or Organizations: Not on file  . Attends Archivist Meetings: Not on file  . Marital Status: Not on file  Intimate Partner Violence:   . Fear of Current or Ex-Partner: Not on file  . Emotionally Abused: Not on file  . Physically Abused: Not on file  . Sexually Abused: Not on file    Review of Systems  Per HPI.  Objective:   Vitals:   11/10/19 0959  BP: 131/85  Pulse: (!) 52  Temp: 97.6 F  (36.4 C)  TempSrc: Temporal  SpO2: 97%  Weight: 194 lb (88 kg)  Height: 5\' 11"  (1.803 m)     Physical Exam Vitals reviewed.  Constitutional:      Appearance: He is well-developed.  HENT:     Head: Normocephalic and atraumatic.  Eyes:     Pupils: Pupils are equal, round, and reactive to light.  Neck:     Vascular: No carotid bruit or JVD.  Cardiovascular:     Rate and Rhythm: Normal rate and regular rhythm.     Heart sounds: Normal heart sounds. No murmur.  Pulmonary:     Effort: Pulmonary effort is normal.     Breath sounds: Normal breath sounds. No rales.  Abdominal:     General: Abdomen is flat. Bowel sounds are normal. There is no distension.     Tenderness: There is no abdominal tenderness. There is no guarding.  Genitourinary:    Prostate: Enlarged and nodules present (Enlarged with firm area centrally approximately 6:00, possible nodule, nontender.). Not tender.  Skin:    General: Skin is warm and dry.  Neurological:     Mental Status: He is alert and oriented to person, place, and time.     Results for orders placed or performed in visit on 11/10/19  IFOBT POC (occult bld, rslt in office, Hemosure)  Result Value Ref Range   IFOBT Negative       Assessment & Plan:  Peter Taylor is a 69 y.o. male . Generalized abdominal pain Heartburn Elevated lipase - Plan: Lipid panel, Lipase  -Abdomen nontender at this time, no nausea/vomiting, no significant alcohol history.  Repeat lipase.  Check triglycerides.  -Continue omeprazole, follow-up with gastroenterology planned.  ER precautions given  Chest wall pain  -Improved, likely component of heartburn, continue PPI, cardiology follow-up as planned  Screening for hyperlipidemia - Plan: Lipid panel  Abnormal prostate exam - Plan: PSA, Ambulatory referral to Urology Nocturia - Plan: PSA, Ambulatory referral to Urology Family history of prostate cancer - Plan: PSA, Ambulatory referral to Urology Screening for  prostate cancer - Plan: PSA  -Lower urinary tract symptoms past 3 to 3-1/2 years, has been treated with over-the-counter beta prostate supplement, doing well overall.  Suspected component of BPH with enlarged prostate but also firm area centrally, differential includes nodule/prostate cancer.  Family history of prostate cancer.  Check PSA, refer to urology for further evaluation.  BRBPR (bright red blood per rectum) - Plan: IFOBT POC (occult bld, rslt in office, Hemosure), Ambulatory referral to Gastroenterology, IFOBT POC (occult bld, rslt in office, Hemosure) Special screening for malignant neoplasms, colon -  Plan: Ambulatory referral to Gastroenterology  -Heme-negative in office, due for colonoscopy, refer to gastroenterology.  No orders of the defined types were placed in this encounter.  Patient Instructions    I will recheck lipase or pancreas test.  If any worsening abdominal pain, fevers, nausea or vomiting be seen in the emergency room.  Keep follow-up with cardiology as planned  I will refer you to urology to evaluate the prostate symptoms and for repeat check of the prostate exam as there is an area that feels enlarged/firm.  I am checking a PSA level as well today.  I will refer you to gastroenterology.  Continue omeprazole once per day for now, recheck 1 month.  Return to the clinic or go to the nearest emergency room if any of your symptoms worsen or new symptoms occur.    If you have lab work done today you will be contacted with your lab results within the next 2 weeks.  If you have not heard from [place] then please contact us. The fastest way to get your results is to register for My Chart.   IF you received an x-ray today, you will receive an invoice from [facility]. Please contact [facility] at [phone] with questions or concerns regarding your invoice.   IF you received labwork today, you will receive an invoice from [facility]. Please contact [facility]  at [phone] with questions or concerns regarding your invoice.   Our billing staff will not be able to assist you with questions regarding bills from these companies.  You will be contacted with the lab results as soon as they are available. The fastest way to get your results is to activate your My Chart account. Instructions are located on the last page of this paperwork. If you have not heard from [place] regarding the results in 2 weeks, please contact this office.         Signed, Merri Ray, MD [facility]

## 2019-11-10 NOTE — H&P (View-Only) (Signed)
Cardiology Office Note   Date:  11/11/2019   ID:  Peter Taylor, DOB [birth year], MRN [id number]  PCP:  Wendie Agreste, MD    No chief complaint on file.  Chest pain  Wt Readings from Last 3 Encounters:  11/11/19 195 lb 12.8 oz (88.8 kg)  11/10/19 194 lb (88 kg)  11/03/19 195 lb 12.8 oz (88.8 kg)       History of Present Illness: Peter Taylor is a 69 y.o. male who is being seen today for the evaluation of chest pain at the request of Wendie Agreste, MD.    He reported chest pain, several weeks ago to his PMD. It had been intensifying over the course of a few months.  He had one episode that woke him from sleep which prompted his visit to the doctor.  He took a chewable acid reducer which gave temporary relief.  Sx were worse after eating. He felt he was belching. THen, He tried omeprazole and had significant relief, but still has some belching.  PMD felt it was chest wall pain but he was sent here given RF for CAD (age, male gender, 38 pack years smoking history- quit in 2013).   He does not exercise much.  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   No prior cardiac tests.  He has not seen in a doctor for many years before seeing Dr. Carlota Raspberry.     No past medical history on file.  No past surgical history on file.   Current Outpatient Medications  Medication Sig Dispense Refill  . omeprazole (PRILOSEC) 20 MG capsule Take 1 capsule (20 mg total) by mouth daily. 30 capsule 1   No current facility-administered medications for this visit.    Allergies:   Penicillins    Social History:  The patient  reports that he has quit smoking. He has quit using smokeless tobacco. He reports current alcohol use. He reports that he does not use drugs.   Family History:  The patient's family history includes Cancer in his brother and mother. No early CAD known   ROS:  Please see the history of present illness.    Otherwise, review of systems are positive for chest pain.   All other systems are reviewed and negative.    PHYSICAL EXAM: VS:  BP 124/76   Pulse 60   Ht 5\' 11"  (1.803 m)   Wt 195 lb 12.8 oz (88.8 kg)   SpO2 96%   BMI 27.31 kg/m  , BMI Body mass index is 27.31 kg/m. GEN: Well nourished, well developed, in no acute distress  HEENT: normal  Neck: no JVD, carotid bruits, or masses Cardiac: RRR; no murmurs, rubs, or gallops,no edema  Respiratory:  clear to auscultation bilaterally, normal work of breathing GI: soft, nontender, nondistended, + BS MS: no deformity or atrophy  Skin: warm and dry, no rash Neuro:  Strength and sensation are intact Psych: euthymic mood, full affect   EKG:   The ekg ordered 11/03/2019 demonstrates NSR, no ST changes   Recent Labs: 11/03/2019: ALT 31; BUN 17; Creatinine, Ser 1.14; Hemoglobin 15.4; NT-Pro BNP <5; Platelets 267; Potassium 4.2; Sodium 145   Lipid Panel    Component Value Date/Time   CHOL 235 (H) 11/10/2019 1159   TRIG 96 11/10/2019 1159   HDL 55 11/10/2019 1159   CHOLHDL 4.3 11/10/2019 1159   LDLCALC 163 (H) 11/10/2019 1159  Other studies Reviewed: Additional studies/ records that were reviewed today with results demonstrating: labs, ECG reviewed.   ASSESSMENT AND PLAN:  1. Chest pain: Several atypical features.  He has multiple RF for CAD so will check CTA coronaries.  Start. ASA 81 mg daily. 2. GERD: Improved with omeprazole.   3. Hyperlipidemia: He has not been treated for anything other but an accident.  Start atorvastatin 20 mg daily,.  Recheck labs with lipids and lier in a few months.  4. I told him to expect several screening tests from his PMD.     Current medicines are reviewed at length with the patient today.  The patient concerns regarding his medicines were addressed.  The following changes have been made:  Start aspirin, atorvastatin  Labs/ tests ordered today include:  No orders of the defined types were  placed in this encounter.   Recommend 150 minutes/week of aerobic exercise Low fat, low carb, high fiber diet recommended  Disposition:   FU for tests   Signed, Larae Grooms, MD  11/11/2019 9:00 AM    [facility] [place], [place], [place]  [address] Phone: [phone]; Fax: [phone]

## 2019-11-10 NOTE — Progress Notes (Signed)
Cardiology Office Note   Date:  11/11/2019   ID:  Peter Taylor, DOB Oct 01, 1951, MRN [id number]  PCP:  Wendie Agreste, MD    No chief complaint on file.  Chest pain  Wt Readings from Last 3 Encounters:  11/11/19 195 lb 12.8 oz (88.8 kg)  11/10/19 194 lb (88 kg)  11/03/19 195 lb 12.8 oz (88.8 kg)       History of Present Illness: Peter Taylor is a 69 y.o. male who is being seen today for the evaluation of chest pain at the request of Wendie Agreste, MD.    He reported chest pain, several weeks ago to his PMD. It had been intensifying over the course of a few months.  He had one episode that woke him from sleep which prompted his visit to the doctor.  He took a chewable acid reducer which gave temporary relief.  Sx were worse after eating. He felt he was belching. THen, He tried omeprazole and had significant relief, but still has some belching.  PMD felt it was chest wall pain but he was sent here given RF for CAD (age, male gender, 30 pack years smoking history- quit in 2013).   He does not exercise much.  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   No prior cardiac tests.  He has not seen in a doctor for many years before seeing Dr. Carlota Raspberry.     No past medical history on file.  No past surgical history on file.   Current Outpatient Medications  Medication Sig Dispense Refill  . omeprazole (PRILOSEC) 20 MG capsule Take 1 capsule (20 mg total) by mouth daily. 30 capsule 1   No current facility-administered medications for this visit.    Allergies:   Penicillins    Social History:  The patient  reports that he has quit smoking. He has quit using smokeless tobacco. He reports current alcohol use. He reports that he does not use drugs.   Family History:  The patient's family history includes Cancer in his brother and mother. No early CAD known   ROS:  Please see the history of present illness.    Otherwise, review of systems are positive for chest pain.   All other systems are reviewed and negative.    PHYSICAL EXAM: VS:  BP 124/76   Pulse 60   Ht 5\' 11"  (1.803 m)   Wt 195 lb 12.8 oz (88.8 kg)   SpO2 96%   BMI 27.31 kg/m  , BMI Body mass index is 27.31 kg/m. GEN: Well nourished, well developed, in no acute distress  HEENT: normal  Neck: no JVD, carotid bruits, or masses Cardiac: RRR; no murmurs, rubs, or gallops,no edema  Respiratory:  clear to auscultation bilaterally, normal work of breathing GI: soft, nontender, nondistended, + BS MS: no deformity or atrophy  Skin: warm and dry, no rash Neuro:  Strength and sensation are intact Psych: euthymic mood, full affect   EKG:   The ekg ordered 11/03/2019 demonstrates NSR, no ST changes   Recent Labs: 11/03/2019: ALT 31; BUN 17; Creatinine, Ser 1.14; Hemoglobin 15.4; NT-Pro BNP <5; Platelets 267; Potassium 4.2; Sodium 145   Lipid Panel    Component Value Date/Time   CHOL 235 (H) 11/10/2019 1159   TRIG 96 11/10/2019 1159   HDL 55 11/10/2019 1159   CHOLHDL 4.3 11/10/2019 1159   LDLCALC 163 (H) 11/10/2019 1159  Other studies Reviewed: Additional studies/ records that were reviewed today with results demonstrating: labs, ECG reviewed.   ASSESSMENT AND PLAN:  1. Chest pain: Several atypical features.  He has multiple RF for CAD so will check CTA coronaries.  Start. ASA 81 mg daily. 2. GERD: Improved with omeprazole.   3. Hyperlipidemia: He has not been treated for anything other but an accident.  Start atorvastatin 20 mg daily,.  Recheck labs with lipids and lier in a few months.  4. I told him to expect several screening tests from his PMD.     Current medicines are reviewed at length with the patient today.  The patient concerns regarding his medicines were addressed.  The following changes have been made:  Start aspirin, atorvastatin  Labs/ tests ordered today include:  No orders of the defined types were  placed in this encounter.   Recommend 150 minutes/week of aerobic exercise Low fat, low carb, high fiber diet recommended  Disposition:   FU for tests   Signed, Larae Grooms, MD  11/11/2019 9:00 AM    [facility] [place], [place], [place]  [address] Phone: [phone]; Fax: [phone]

## 2019-11-11 ENCOUNTER — Encounter: Payer: Self-pay | Admitting: Interventional Cardiology

## 2019-11-11 ENCOUNTER — Ambulatory Visit: Payer: Medicare HMO | Admitting: Interventional Cardiology

## 2019-11-11 VITALS — BP 124/76 | HR 60 | Ht 71.0 in | Wt 195.8 lb

## 2019-11-11 DIAGNOSIS — K219 Gastro-esophageal reflux disease without esophagitis: Secondary | ICD-10-CM | POA: Diagnosis not present

## 2019-11-11 DIAGNOSIS — R072 Precordial pain: Secondary | ICD-10-CM

## 2019-11-11 DIAGNOSIS — E782 Mixed hyperlipidemia: Secondary | ICD-10-CM | POA: Diagnosis not present

## 2019-11-11 DIAGNOSIS — E785 Hyperlipidemia, unspecified: Secondary | ICD-10-CM | POA: Insufficient documentation

## 2019-11-11 LAB — LIPID PANEL
Chol/HDL Ratio: 4.3 ratio (ref 0.0–5.0)
Cholesterol, Total: 235 mg/dL — ABNORMAL HIGH (ref 100–199)
HDL: 55 mg/dL (ref >39)
LDL Chol Calc (NIH): 163 mg/dL — ABNORMAL HIGH (ref 0–99)
Triglycerides: 96 mg/dL (ref 0–149)
VLDL Cholesterol Cal: 17 mg/dL (ref 5–40)

## 2019-11-11 LAB — LIPASE: Lipase: 23 U/L (ref 13–78)

## 2019-11-11 LAB — PSA: Prostate Specific Ag, Serum: 3.4 ng/mL (ref 0.0–4.0)

## 2019-11-11 MED ORDER — ASPIRIN EC 81 MG PO TBEC: 81.0000 mg | DELAYED_RELEASE_TABLET | Freq: Every day | ORAL | 3 refills | Status: DC

## 2019-11-11 MED ORDER — METOPROLOL TARTRATE 25 MG PO TABS: ORAL_TABLET | ORAL | 0 refills | Status: DC

## 2019-11-11 MED ORDER — ATORVASTATIN CALCIUM 20 MG PO TABS: 20.0000 mg | ORAL_TABLET | Freq: Every day | ORAL | 3 refills | Status: DC

## 2019-11-11 NOTE — Patient Instructions (Signed)
Medication Instructions:  Your physician has recommended you make the following change in your medication:   1. START: aspirin (enteric coated) 81 mg by mouth once a day with food  2. START: atorvastatin (lipitor) 20 mg tablet: Take 1 tablet by mouth once a day  *If you need a refill on your cardiac medications before your next appointment, please call your pharmacy*  Lab Work: Your physician recommends that you return for a FASTING lipid profile and liver function panel in 3 months  Your physician recommends that you return for lab work (BMET) prior to Cardiac CT  If you have labs (blood work) drawn today and your tests are completely normal, you will receive your results only by: Marland Kitchen MyChart Message (if you have MyChart) OR . A paper copy in the mail If you have any lab test that is abnormal or we need to change your treatment, we will call you to review the results.  Testing/Procedures: Your physician has requested that you have cardiac CT. Cardiac computed tomography (CT) is a painless test that uses an x-ray machine to take clear, detailed pictures of your heart. For further information please visit [email or web address]. Please follow instruction sheet as given.  Follow-Up: Based on test results  Other Instructions  CARDIAC CT INSTRUCTIONS  Your cardiac CT will be scheduled at one of the below locations:   [facility] [address] [place], [place] [address] ([phone] [place] [address] [place], [place] [address] [phone]  If scheduled at [facility], please arrive at the [facility] main entrance of [facility] 30-45 minutes prior to test start time. Proceed to the [facility] Radiology Department (first floor) to check-in and test prep.  If scheduled at [facility], please arrive 15 mins early for check-in and test prep.  Please follow these  instructions carefully (unless otherwise directed):   On the Night Before the Test: . Be sure to Drink plenty of water. . Do not consume any caffeinated/decaffeinated beverages or chocolate 12 hours prior to your test. . Do not take any antihistamines 12 hours prior to your test.  On the Day of the Test: . Drink plenty of water. Do not drink any water within one hour of the test. . Do not eat any food 4 hours prior to the test. . You may take your regular medications prior to the test.  . Take metoprolol (Lopressor) 25 MG two hours prior to test.      After the Test: . Drink plenty of water. . After receiving IV contrast, you may experience a mild flushed feeling. This is normal. . On occasion, you may experience a mild rash up to 24 hours after the test. This is not dangerous. If this occurs, you can take Benadryl 25 mg and increase your fluid intake. . If you experience trouble breathing, this can be serious. If it is severe call 911 IMMEDIATELY. If it is mild, please call our office.   Once we have confirmed authorization from your insurance company, we will call you to set up a date and time for your test.   For non-scheduling related questions, please contact the cardiac imaging nurse navigator should you have any questions/concerns: Marchia Bond, RN Navigator Cardiac Imaging [facility] [phone] Office

## 2019-11-12 ENCOUNTER — Encounter: Payer: Self-pay | Admitting: Internal Medicine

## 2019-11-12 ENCOUNTER — Other Ambulatory Visit: Payer: Self-pay

## 2019-11-16 NOTE — Progress Notes (Signed)
Let message for pt.

## 2019-11-25 ENCOUNTER — Other Ambulatory Visit: Payer: Self-pay

## 2019-11-25 ENCOUNTER — Other Ambulatory Visit: Payer: Medicare HMO

## 2019-11-25 ENCOUNTER — Ambulatory Visit: Payer: Medicare HMO | Admitting: Internal Medicine

## 2019-11-25 ENCOUNTER — Encounter: Payer: Self-pay | Admitting: Internal Medicine

## 2019-11-25 VITALS — BP 140/70 | HR 53 | Temp 98.4°F | Ht 71.0 in | Wt 193.0 lb

## 2019-11-25 DIAGNOSIS — Z800 Family history of malignant neoplasm of digestive organs: Secondary | ICD-10-CM

## 2019-11-25 DIAGNOSIS — K219 Gastro-esophageal reflux disease without esophagitis: Secondary | ICD-10-CM

## 2019-11-25 DIAGNOSIS — Z01818 Encounter for other preprocedural examination: Secondary | ICD-10-CM

## 2019-11-25 DIAGNOSIS — K625 Hemorrhage of anus and rectum: Secondary | ICD-10-CM

## 2019-11-25 DIAGNOSIS — R140 Abdominal distension (gaseous): Secondary | ICD-10-CM | POA: Diagnosis not present

## 2019-11-25 MED ORDER — NA SULFATE-K SULFATE-MG SULF 17.5-3.13-1.6 GM/177ML PO SOLN: 1.0000 | Freq: Once | ORAL | 0 refills | Status: AC

## 2019-11-25 NOTE — Patient Instructions (Signed)
You have been scheduled for an endoscopy and colonoscopy. Please follow the written instructions given to you at your visit today. Please pick up your prep supplies at the pharmacy within the next 1-3 days. If you use inhalers (even only as needed), please bring them with you on the day of your procedure.  

## 2019-11-25 NOTE — Progress Notes (Signed)
HISTORY OF PRESENT ILLNESS:  Peter Taylor is a 69 y.o. male with no significant past medical history who was sent today by his primary care provider Dr. Carlota Raspberry regarding GERD, abdominal bloating, minor rectal bleeding, and the need for colonoscopy.  The patient reports a several month history of postprandial bloating.  No nausea or vomiting.  Good appetite.  Stable weight.  Regular bowel movements.  No associated pain, just uncomfortable feeling.  Next, he reports chronic reflux symptoms as manifested by pyrosis and regurgitation.  No dysphagia.  About 2 weeks ago he was placed on omeprazole therapy daily.  Since that time he has had resolution of chronic reflux symptoms.  Finally, he reports having had an isolated episode of minor rectal bleeding.  There is a family history of colon cancer in his brother around age 76.  The patient has not had any prior endoscopic evaluations.  Review of outside blood work from November 03, 2019 reveals unremarkable comprehensive metabolic panel except for mildly elevated lipase of 102.  This was repeated 1 week later and returned normal.  Normal liver test.  Unremarkable CBC with hemoglobin 15.4.  Patient was having problems with atypical chest pain for which he has scheduled a CT of the coronary arteries.  He tells me that his atypical chest pain has resolved since starting omeprazole.  He does not smoke.  Rare alcohol.  REVIEW OF SYSTEMS:  All non-GI ROS negative unless otherwise stated in the HPI except for headaches  Past Medical History:  Diagnosis Date  . GERD (gastroesophageal reflux disease)   . Hyperlipidemia     Past Surgical History:  Procedure Laterality Date  . NO PAST SURGERIES      Social History Peter Taylor  reports that he has quit smoking. He has quit using smokeless tobacco. He reports current alcohol use. He reports that he does not use drugs.  family history includes Cancer in his brother and mother; Ovarian cancer in his sister,  sister, and sister.  Allergies  Allergen Reactions  . Penicillins Hives       PHYSICAL EXAMINATION: Vital signs: BP 140/70   Pulse (!) 53   Temp 98.4 F (36.9 C)   Ht 5\' 11"  (1.803 m)   Wt 193 lb (87.5 kg)   BMI 26.92 kg/m   Constitutional: generally well-appearing, no acute distress Psychiatric: alert and oriented x3, cooperative Eyes: extraocular movements intact, anicteric, conjunctiva pink Mouth: oral pharynx moist, no lesions Neck: supple no lymphadenopathy Cardiovascular: heart regular rate and rhythm, no murmur Lungs: clear to auscultation bilaterally Abdomen: soft, nontender, nondistended, no obvious ascites, no peritoneal signs, normal bowel sounds, no organomegaly Rectal: Deferred to colonoscopy Extremities: no clubbing, cyanosis, or lower extremity edema bilaterally Skin: no lesions on visible extremities Neuro: No focal deficits.  Cranial nerves intact  ASSESSMENT:  1.  Abdominal bloating discomfort.  No alarm features at present.  Several months duration.  Etiology unclear.  Needs further investigation 2.  Chronic GERD.  Good response to PPI therapy. 3.  Minor rectal bleeding 4.  Family history of colon cancer in brother at age 8   PLAN:  60.  Reflux precautions 2.  Continue PPI 3.  Schedule upper endoscopy to evaluate abdominal bloating discomfort/dyspepsia.The nature of the procedure, as well as the risks, benefits, and alternatives were carefully and thoroughly reviewed with the patient. Ample time for discussion and questions allowed. The patient understood, was satisfied, and agreed to proceed. 4.  Schedule colonoscopy for colon cancer screening and  to evaluate minor rectal bleeding.The nature of the procedure, as well as the risks, benefits, and alternatives were carefully and thoroughly reviewed with the patient. Ample time for discussion and questions allowed. The patient understood, was satisfied, and agreed to proceed. 5.  If the above work-up  unrevealing, then schedule abdominal ultrasound to evaluate bloating discomfort

## 2019-11-26 ENCOUNTER — Ambulatory Visit (INDEPENDENT_AMBULATORY_CARE_PROVIDER_SITE_OTHER): Payer: Medicare HMO | Admitting: Registered Nurse

## 2019-11-26 ENCOUNTER — Other Ambulatory Visit: Payer: Self-pay

## 2019-11-26 ENCOUNTER — Encounter: Payer: Self-pay | Admitting: Registered Nurse

## 2019-11-26 VITALS — BP 143/78 | HR 59 | Temp 98.1°F | Resp 16 | Ht 71.0 in | Wt 192.0 lb

## 2019-11-26 DIAGNOSIS — Z1152 Encounter for screening for COVID-19: Secondary | ICD-10-CM

## 2019-11-26 DIAGNOSIS — R079 Chest pain, unspecified: Secondary | ICD-10-CM | POA: Diagnosis not present

## 2019-11-26 DIAGNOSIS — Z01818 Encounter for other preprocedural examination: Secondary | ICD-10-CM

## 2019-11-26 DIAGNOSIS — Z1329 Encounter for screening for other suspected endocrine disorder: Secondary | ICD-10-CM

## 2019-11-26 DIAGNOSIS — Z130 Encounter for screening for diseases of the blood and blood-forming organs and certain disorders involving the immune mechanism: Secondary | ICD-10-CM | POA: Diagnosis not present

## 2019-11-26 DIAGNOSIS — Z13228 Encounter for screening for other metabolic disorders: Secondary | ICD-10-CM | POA: Diagnosis not present

## 2019-11-26 NOTE — Progress Notes (Signed)
Established Patient Office Visit  Subjective:  Patient ID: Peter Taylor, male    DOB: April 16, 1951  Age: 69 y.o. MRN: [id number]  CC:  Chief Complaint  Patient presents with  . Chest Pain    PATIENT IS HERE FOR LABS having heart procedure on 11/30/19 at [facility], did not bring ppw    HPI Peter Taylor presents for preoperative labs for cardiology procedure Unfortunately forgot his paperwork at home. Will run routine preop labs today and get repeat EKG He has COVID testing scheduled for Feb 5 at [facility].  Feeling well overall. He has questions about his last labs - we will review these.  Past Medical History:  Diagnosis Date  . GERD (gastroesophageal reflux disease)   . Hyperlipidemia     Past Surgical History:  Procedure Laterality Date  . NO PAST SURGERIES      Family History  Problem Relation Age of Onset  . Cancer Mother        unsure type  . Cancer Brother        pancreatic  . Ovarian cancer Sister   . Ovarian cancer Sister   . Ovarian cancer Sister     Social History   Socioeconomic History  . Marital status: Single    Spouse name: Not on file  . Number of children: 8  . Years of education: Not on file  . Highest education level: Not on file  Occupational History  . Occupation: truck Geophysicist/field seismologist  Tobacco Use  . Smoking status: Former Research scientist (life sciences)  . Smokeless tobacco: Former Network engineer and Sexual Activity  . Alcohol use: Yes    Comment: occ  . Drug use: No  . Sexual activity: Not on file  Other Topics Concern  . Not on file  Social History Narrative  . Not on file   Social Determinants of Health   Financial Resource Strain:   . Difficulty of Paying Living Expenses: Not on file  Food Insecurity:   . Worried About Charity fundraiser in the Last Year: Not on file  . Ran Out of Food in the Last Year: Not on file  Transportation Needs:   . Lack of Transportation (Medical): Not on file  . Lack of Transportation (Non-Medical): Not on file  Physical  Activity:   . Days of Exercise per Week: Not on file  . Minutes of Exercise per Session: Not on file  Stress:   . Feeling of Stress : Not on file  Social Connections:   . Frequency of Communication with Friends and Family: Not on file  . Frequency of Social Gatherings with Friends and Family: Not on file  . Attends Religious Services: Not on file  . Active Member of Clubs or Organizations: Not on file  . Attends Archivist Meetings: Not on file  . Marital Status: Not on file  Intimate Partner Violence:   . Fear of Current or Ex-Partner: Not on file  . Emotionally Abused: Not on file  . Physically Abused: Not on file  . Sexually Abused: Not on file    Outpatient Medications Prior to Visit  Medication Sig Dispense Refill  . aspirin EC 81 MG tablet Take 1 tablet (81 mg total) by mouth daily. 90 tablet 3  . atorvastatin (LIPITOR) 20 MG tablet Take 1 tablet (20 mg total) by mouth daily. 90 tablet 3  . omeprazole (PRILOSEC) 20 MG capsule Take 1 capsule (20 mg total) by mouth daily. 30 capsule 1  .  metoprolol tartrate (LOPRESSOR) 25 MG tablet Take 1 tablet by mouth 2 hours prior to Cardiac CT (Patient not taking: Reported on 11/26/2019) 1 tablet 0   No facility-administered medications prior to visit.    Allergies  Allergen Reactions  . Penicillins Hives    ROS Review of Systems  Constitutional: Negative.   HENT: Negative.   Eyes: Negative.   Respiratory: Negative.   Cardiovascular: Negative.   Gastrointestinal: Negative.   Endocrine: Negative.   Genitourinary: Negative.   Musculoskeletal: Negative.   Skin: Negative.   Allergic/Immunologic: Negative.   Neurological: Negative.   Hematological: Negative.   Psychiatric/Behavioral: Negative.   All other systems reviewed and are negative.     Objective:    Physical Exam  Constitutional: He is oriented to person, place, and time. He appears well-developed and well-nourished. No distress.  Cardiovascular: Normal  rate and regular rhythm.  Pulmonary/Chest: Effort normal. No respiratory distress.  Neurological: He is alert and oriented to person, place, and time.  Skin: Skin is warm and dry. No rash noted. He is not diaphoretic. No erythema. No pallor.  Psychiatric: He has a normal mood and affect. His behavior is normal. Judgment and thought content normal.  Nursing note and vitals reviewed.   BP (!) 143/78   Pulse (!) 59   Temp 98.1 F (36.7 C) (Temporal)   Resp 16   Ht 5\' 11"  (1.803 m)   Wt 192 lb (87.1 kg)   SpO2 97%   BMI 26.78 kg/m  Wt Readings from Last 3 Encounters:  11/26/19 192 lb (87.1 kg)  11/25/19 193 lb (87.5 kg)  11/11/19 195 lb 12.8 oz (88.8 kg)     There are no preventive care reminders to display for this patient.  There are no preventive care reminders to display for this patient.  No results found for: TSH Lab Results  Component Value Date   WBC 5.5 11/03/2019   HGB 15.4 11/03/2019   HCT 45.5 11/03/2019   MCV 88 11/03/2019   PLT 267 11/03/2019   Lab Results  Component Value Date   NA 145 (H) 11/03/2019   K 4.2 11/03/2019   CO2 25 11/03/2019   GLUCOSE 107 (H) 11/03/2019   BUN 17 11/03/2019   CREATININE 1.14 11/03/2019   BILITOT 0.3 11/03/2019   ALKPHOS 110 11/03/2019   AST 17 11/03/2019   ALT 31 11/03/2019   PROT 7.2 11/03/2019   ALBUMIN 4.5 11/03/2019   CALCIUM 9.8 11/03/2019   Lab Results  Component Value Date   CHOL 235 (H) 11/10/2019   Lab Results  Component Value Date   HDL 55 11/10/2019   Lab Results  Component Value Date   LDLCALC 163 (H) 11/10/2019   Lab Results  Component Value Date   TRIG 96 11/10/2019   Lab Results  Component Value Date   CHOLHDL 4.3 11/10/2019   No results found for: HGBA1C    Assessment & Plan:   Problem List Items Addressed This Visit    None    Visit Diagnoses    Screening for endocrine, metabolic and immunity disorder    -  Primary   Relevant Orders   Lipase   Amylase   Comprehensive  metabolic panel   CBC   TSH   Hemoglobin A1c   Preoperative testing       Relevant Orders   Lipase   Amylase   Comprehensive metabolic panel   CBC   TSH   EKG 12-Lead   Hemoglobin A1c  Chest pain, unspecified type       Relevant Orders   Brain natriuretic peptide   EKG 12-Lead   Encounter for screening for COVID-19          No orders of the defined types were placed in this encounter.   Follow-up: No follow-ups on file.   PLAN  Labs drawn  ekg performed, no changes  Discussed previous labs with him  Pending lab results, seems clinically stable for cardiology procedure  Patient encouraged to call clinic with any questions, comments, or concerns.  Maximiano Coss, NP

## 2019-11-26 NOTE — Patient Instructions (Signed)
° ° ° °  If you have lab work done today you will be contacted with your lab results within the next 2 weeks.  If you have not heard from us then please contact us. The fastest way to get your results is to register for My Chart. ° ° °IF you received an x-ray today, you will receive an invoice from [facility]. Please contact [facility] at [phone] with questions or concerns regarding your invoice.  ° °IF you received labwork today, you will receive an invoice from [facility]. Please contact [facility] at [phone] with questions or concerns regarding your invoice.  ° °Our billing staff will not be able to assist you with questions regarding bills from these companies. ° °You will be contacted with the lab results as soon as they are available. The fastest way to get your results is to activate your My Chart account. Instructions are located on the last page of this paperwork. If you have not heard from us regarding the results in 2 weeks, please contact this office. °  ° ° ° °

## 2019-11-27 LAB — LIPASE: Lipase: 37 U/L (ref 13–78)

## 2019-11-27 LAB — COMPREHENSIVE METABOLIC PANEL
ALT: 31 IU/L (ref 0–44)
AST: 21 IU/L (ref 0–40)
Albumin/Globulin Ratio: 1.5 (ref 1.2–2.2)
Albumin: 4.2 g/dL (ref 3.8–4.8)
Alkaline Phosphatase: 119 IU/L — ABNORMAL HIGH (ref 39–117)
BUN/Creatinine Ratio: 11 (ref 10–24)
BUN: 14 mg/dL (ref 8–27)
Bilirubin Total: 0.4 mg/dL (ref 0.0–1.2)
CO2: 24 mmol/L (ref 20–29)
Calcium: 9.6 mg/dL (ref 8.6–10.2)
Chloride: 104 mmol/L (ref 96–106)
Creatinine, Ser: 1.33 mg/dL — ABNORMAL HIGH (ref 0.76–1.27)
GFR calc Af Amer: 64 mL/min/{1.73_m2} (ref >59)
GFR calc non Af Amer: 55 mL/min/{1.73_m2} — ABNORMAL LOW (ref >59)
Globulin, Total: 2.8 g/dL (ref 1.5–4.5)
Glucose: 69 mg/dL (ref 65–99)
Potassium: 4 mmol/L (ref 3.5–5.2)
Sodium: 142 mmol/L (ref 134–144)
Total Protein: 7 g/dL (ref 6.0–8.5)

## 2019-11-27 LAB — CBC
Hematocrit: 44.4 % (ref 37.5–51.0)
Hemoglobin: 14.8 g/dL (ref 13.0–17.7)
MCH: 29.5 pg (ref 26.6–33.0)
MCHC: 33.3 g/dL (ref 31.5–35.7)
MCV: 89 fL (ref 79–97)
Platelets: 273 10*3/uL (ref 150–450)
RBC: 5.01 x10E6/uL (ref 4.14–5.80)
RDW: 14.2 % (ref 11.6–15.4)
WBC: 6.1 10*3/uL (ref 3.4–10.8)

## 2019-11-27 LAB — TSH: TSH: 1.93 u[IU]/mL (ref 0.450–4.500)

## 2019-11-27 LAB — HEMOGLOBIN A1C
Est. average glucose Bld gHb Est-mCnc: 143 mg/dL
Hgb A1c MFr Bld: 6.6 % — ABNORMAL HIGH (ref 4.8–5.6)

## 2019-11-27 LAB — AMYLASE: Amylase: 70 U/L (ref 31–110)

## 2019-11-27 LAB — BRAIN NATRIURETIC PEPTIDE: BNP: 7.8 pg/mL (ref 0.0–100.0)

## 2019-11-29 ENCOUNTER — Telehealth (HOSPITAL_COMMUNITY): Payer: Self-pay | Admitting: Emergency Medicine

## 2019-11-29 NOTE — Progress Notes (Signed)
FYI Pre-op labs for cardiac procedure - he is following up with you on 12/09/19  Kathrin Ruddy, NP

## 2019-11-29 NOTE — Telephone Encounter (Signed)
Reaching out to patient to offer assistance regarding upcoming cardiac imaging study; pt verbalizes understanding of appt date/time, parking situation and where to check in, pre-test NPO status and medications ordered, and verified current allergies; name and call back number provided for further questions should they arise Marchelle Rinella RN Navigator Cardiac Imaging [facility] [phone] office [phone] cell 

## 2019-11-30 ENCOUNTER — Ambulatory Visit (HOSPITAL_COMMUNITY)
Admission: RE | Admit: 2019-11-30 | Discharge: 2019-11-30 | Disposition: A | Discharge status: Home / Self Care | Payer: Medicare HMO | Source: Ambulatory Visit | Attending: Interventional Cardiology | Admitting: Interventional Cardiology

## 2019-11-30 ENCOUNTER — Other Ambulatory Visit: Payer: Self-pay

## 2019-11-30 ENCOUNTER — Encounter (HOSPITAL_COMMUNITY): Payer: Self-pay

## 2019-11-30 DIAGNOSIS — R072 Precordial pain: Secondary | ICD-10-CM | POA: Insufficient documentation

## 2019-11-30 DIAGNOSIS — I2510 Atherosclerotic heart disease of native coronary artery without angina pectoris: Secondary | ICD-10-CM | POA: Diagnosis not present

## 2019-11-30 MED ORDER — NITROGLYCERIN 0.4 MG SL SUBL: 0.8000 mg | SUBLINGUAL_TABLET | Freq: Once | SUBLINGUAL | Status: AC

## 2019-11-30 MED ORDER — NITROGLYCERIN 0.4 MG SL SUBL
SUBLINGUAL_TABLET | SUBLINGUAL | Status: AC
  Administered 2019-11-30: 0.8 mg via SUBLINGUAL
  Filled 2019-11-30: qty 2

## 2019-11-30 MED ORDER — IOHEXOL 350 MG/ML SOLN
80.0000 mL | Freq: Once | INTRAVENOUS | Status: AC | PRN
  Administered 2019-11-30: 80 mL via INTRAVENOUS

## 2019-11-30 NOTE — Progress Notes (Signed)
Patient tolerated CT without incident. Drank a sprite after. Ambulatory steady gait to exit.

## 2019-12-01 ENCOUNTER — Telehealth: Payer: Self-pay

## 2019-12-01 ENCOUNTER — Telehealth: Payer: Self-pay | Admitting: Registered Nurse

## 2019-12-01 MED ORDER — NITROGLYCERIN 0.4 MG SL SUBL: 0.4000 mg | SUBLINGUAL_TABLET | SUBLINGUAL | 3 refills | Status: DC | PRN

## 2019-12-01 MED ORDER — ISOSORBIDE MONONITRATE ER 30 MG PO TB24: 30.0000 mg | ORAL_TABLET | Freq: Every day | ORAL | 3 refills | Status: DC

## 2019-12-01 NOTE — Telephone Encounter (Signed)
I seen a msg on patient's labs as Pre-op but it do not tell me what to inform patient about his results. Please advise

## 2019-12-01 NOTE — Telephone Encounter (Signed)
Thanks John.  Coronary CT shows a lesion in his LAD, but if there is a significant GI bleeding issue, it may be better to sort this out with endoscopy before cath with PCI and then dual antiplatelet therapy.  If the endoscopy is more a workup for his chest pain, then I think we should do the cath first.    Ulice Dash

## 2019-12-01 NOTE — Telephone Encounter (Signed)
Pt says he missed a call concerning his most recent labs. Please advise at [phone]. I got this vm in medical records.

## 2019-12-01 NOTE — Telephone Encounter (Signed)
Peter Taylor, It depends on his perceived cardiac risk. We can proceed with endoscopic exams next week as scheduled or postpone until after cath. Actually, your call. Let me know either way.Thanks. Jenny Reichmann

## 2019-12-01 NOTE — Telephone Encounter (Signed)
Called and spoke to patient and made him aware of Abnormal CT results. Imdur and SL NTG called in to preferred pharmacy. Attempted to set patient up for cath next week, however patient states that he is scheduled to have his covid test done tomorrow for his colonoscopy scheduled for 2/9 with Dr. Henrene Pastor. It looks like he needs this for abdominal bloating, GERD, and minor rectal bleeding. Normal Hgb on 11/26/19. Made patient aware that I will send a message to Dr. Henrene Pastor and Dr. Irish Lack to weigh in if the patient should have cath or colonoscopy done first.

## 2019-12-01 NOTE — Telephone Encounter (Signed)
No worries. He is set for his procedure, no call required. I have made his PCP Dr. Carlota Raspberry aware of his lab results as well.  Thank you,  Kathrin Ruddy, NP

## 2019-12-01 NOTE — Telephone Encounter (Signed)
-----   Message from Jettie Booze, MD sent at 12/01/2019 11:15 AM EST ----- Abnormal CT FFR of LAD.  Add Imdur 30 mg daily.  OK to call in SL NTG as well. Plan for cath next week. [place], please arrange. Thanks.  I tried to call him, but only left message on voicemail that we had results- did not leave results on the voicemail.

## 2019-12-02 NOTE — Telephone Encounter (Signed)
Spoke with pt and he is aware. 

## 2019-12-02 NOTE — Telephone Encounter (Signed)
Okay.  We will proceed with GI work-up as planned.  Thanks

## 2019-12-03 ENCOUNTER — Ambulatory Visit (INDEPENDENT_AMBULATORY_CARE_PROVIDER_SITE_OTHER): Payer: Medicare HMO

## 2019-12-03 ENCOUNTER — Other Ambulatory Visit: Payer: Self-pay | Admitting: Internal Medicine

## 2019-12-03 ENCOUNTER — Other Ambulatory Visit: Payer: Self-pay

## 2019-12-03 DIAGNOSIS — Z1159 Encounter for screening for other viral diseases: Secondary | ICD-10-CM

## 2019-12-03 NOTE — Telephone Encounter (Signed)
Patient has been scheduled for LHC on 2/12 per Dr. Hassell Done recommendation. Patient aware that I will call him Monday to review instructions.

## 2019-12-04 LAB — SARS CORONAVIRUS 2 (TAT 6-24 HRS): SARS Coronavirus 2: NEGATIVE

## 2019-12-06 NOTE — Telephone Encounter (Signed)
Called and made patient aware of cath instructions below. Post cath f/u appt on 2/26 at 2:40 PM with Dr. Irish Lack. Patient verbalized understanding and thanked me for the call.   [place] OFFICE [place], [place] [address] Dept: [phone] Loc: [place]  12/06/2019  You are scheduled for a Cardiac Catheterization on Friday, February 12 with Dr. Larae Grooms.  1. Please arrive at the [facility] (Main Entrance A) at [facility]: [address] [place], [place] [address] at 8:30 AM (This time is two hours before your procedure to ensure your preparation). Free valet parking service is available.   Special note: Every effort is made to have your procedure done on time. Please understand that emergencies sometimes delay scheduled procedures.  2. Diet: Do not eat solid foods after midnight.  The patient may have clear liquids until 5am upon the day of the procedure.  3. Labs: Labs done on 11/26/19  Your Pre-procedure COVID-19 Testing will be done on 12/07/18 at 10:05 AM at [facility] at [id number], [place], [place] [address]. Once you arrive at the testing site, stay in the right hand lane, go under the building overhang not the tent. If you are tested under the tent your results may not be back before your procedure. Please be on time for your appointment.  After your swab you will be given a mask to wear and instructed to go home and quarantine/no visitors until after your procedure. If you test positive you will be notified and your procedure will be cancelled.    4. Medication instructions in preparation for your procedure:   Contrast Allergy: No   On the morning of your procedure, take your Aspirin and any regular morning medicines.  You may use sips of water.  5. Plan for one night stay--bring personal belongings. 6.  Bring a current list of your medications and current insurance cards. 7. You MUST have a responsible person to drive you home. 8. Someone MUST be with you the first 24 hours after you arrive home or your discharge will be delayed. 9. Please wear clothes that are easy to get on and off and wear slip-on shoes.  Thank you for allowing [place] to care for you!   -- [facility]

## 2019-12-07 ENCOUNTER — Other Ambulatory Visit: Payer: Self-pay

## 2019-12-07 ENCOUNTER — Ambulatory Visit (AMBULATORY_SURGERY_CENTER): Payer: Medicare HMO | Admitting: Internal Medicine

## 2019-12-07 ENCOUNTER — Encounter: Payer: Self-pay | Admitting: Internal Medicine

## 2019-12-07 ENCOUNTER — Telehealth: Payer: Self-pay | Admitting: Family Medicine

## 2019-12-07 VITALS — BP 101/70 | HR 60 | Temp 97.5°F | Resp 18 | Ht 71.0 in | Wt 193.0 lb

## 2019-12-07 DIAGNOSIS — K625 Hemorrhage of anus and rectum: Secondary | ICD-10-CM | POA: Diagnosis not present

## 2019-12-07 DIAGNOSIS — K648 Other hemorrhoids: Secondary | ICD-10-CM | POA: Diagnosis not present

## 2019-12-07 DIAGNOSIS — Z800 Family history of malignant neoplasm of digestive organs: Secondary | ICD-10-CM | POA: Diagnosis not present

## 2019-12-07 DIAGNOSIS — K219 Gastro-esophageal reflux disease without esophagitis: Secondary | ICD-10-CM | POA: Diagnosis not present

## 2019-12-07 DIAGNOSIS — K5730 Diverticulosis of large intestine without perforation or abscess without bleeding: Secondary | ICD-10-CM | POA: Diagnosis not present

## 2019-12-07 DIAGNOSIS — R140 Abdominal distension (gaseous): Secondary | ICD-10-CM

## 2019-12-07 DIAGNOSIS — Z1211 Encounter for screening for malignant neoplasm of colon: Secondary | ICD-10-CM

## 2019-12-07 DIAGNOSIS — D122 Benign neoplasm of ascending colon: Secondary | ICD-10-CM | POA: Diagnosis not present

## 2019-12-07 MED ORDER — SODIUM CHLORIDE 0.9 % IV SOLN: 500.0000 mL | Freq: Once | INTRAVENOUS | Status: DC

## 2019-12-07 NOTE — Progress Notes (Signed)
PT taken to PACU. Monitors in place. VSS. Report given to RN. 

## 2019-12-07 NOTE — Telephone Encounter (Signed)
Pt would like a cb concerning his most recent labs. Please advise at [phone]

## 2019-12-07 NOTE — Op Note (Signed)
[facility] Patient Name: Peter Taylor Procedure Date: 12/07/2019 12:55 PM MRN: [id number] Endoscopist: Docia Chuck. Henrene Pastor , MD Age: 69 Referring MD:  Date of Birth: [birth year] Gender: Male Account #: [id number] Procedure:                Colonoscopy with cold snare polypectomy x 1 Indications:              Screening in patient at increased risk: Colorectal                            cancer in brother 92 or older. Index exam.                            Incidental complaints of minor rectal bleeding and                            bloating Medicines:                Monitored Anesthesia Care Procedure:                Pre-Anesthesia Assessment:                           - Prior to the procedure, a History and Physical                            was performed, and patient medications and                            allergies were reviewed. The patient's tolerance of                            previous anesthesia was also reviewed. The risks                            and benefits of the procedure and the sedation                            options and risks were discussed with the patient.                            All questions were answered, and informed consent                            was obtained. Prior Anticoagulants: The patient has                            taken no previous anticoagulant or antiplatelet                            agents. ASA Grade Assessment: II - A patient with                            mild systemic disease. After reviewing the risks  and benefits, the patient was deemed in                            satisfactory condition to undergo the procedure.                           After obtaining informed consent, the colonoscope                            was passed under direct vision. Throughout the                            procedure, the patient's blood pressure, pulse, and                            oxygen saturations were  monitored continuously. The                            Colonoscope was introduced through the anus and                            advanced to the the cecum, identified by                            appendiceal orifice and ileocecal valve. The                            ileocecal valve, appendiceal orifice, and rectum                            were photographed. The quality of the bowel                            preparation was good. The colonoscopy was performed                            without difficulty. The patient tolerated the                            procedure well. The bowel preparation used was                            SUPREP via split dose instruction. Scope In: 1:20:21 PM Scope Out: 1:32:34 PM Scope Withdrawal Time: 0 hours 9 minutes 49 seconds  Total Procedure Duration: 0 hours 12 minutes 13 seconds  Findings:                 A 1 mm polyp was found in the ascending colon. The                            polyp was removed with a cold snare. Resection and                            retrieval were complete.  Many small and large-mouthed diverticula were found                            in the entire colon.                           Internal hemorrhoids were found during                            retroflexion. The hemorrhoids were small.                           The exam was otherwise without abnormality on                            direct and retroflexion views. Complications:            No immediate complications. Estimated blood loss:                            None. Estimated Blood Loss:     Estimated blood loss: none. Impression:               - One 1 mm polyp in the ascending colon, removed                            with a cold snare. Resected and retrieved.                           - Diverticulosis in the entire examined colon.                           - Internal hemorrhoids.                           - The examination was otherwise  normal on direct                            and retroflexion views. Recommendation:           - Repeat colonoscopy in 7-10 years for surveillance.                           - Patient has a contact number available for                            emergencies. The signs and symptoms of potential                            delayed complications were discussed with the                            patient. Return to normal activities tomorrow.                            Written discharge instructions were provided to the  patient.                           - Resume previous diet.                           - Continue present medications.                           - Await pathology results. Docia Chuck. Henrene Pastor, MD 12/07/2019 1:47:53 PM This report has been signed electronically.

## 2019-12-07 NOTE — Progress Notes (Signed)
Temp by JB Vitals by DT 

## 2019-12-07 NOTE — Telephone Encounter (Signed)
Please Advise

## 2019-12-07 NOTE — Progress Notes (Signed)
Called to room to assist during endoscopic procedure.  Patient ID and intended procedure confirmed with present staff. Received instructions for my participation in the procedure from the performing physician.  

## 2019-12-07 NOTE — Patient Instructions (Signed)
YOU HAD AN ENDOSCOPIC PROCEDURE TODAY AT THE [facility]:   Refer to the procedure report that was given to you for any specific questions about what was found during the examination.  If the procedure report does not answer your questions, please call your gastroenterologist to clarify.  If you requested that your care partner not be given the details of your procedure findings, then the procedure report has been included in a sealed envelope for you to review at your convenience later.  YOU SHOULD EXPECT: Some feelings of bloating in the abdomen. Passage of more gas than usual.  Walking can help get rid of the air that was put into your GI tract during the procedure and reduce the bloating. If you had a lower endoscopy (such as a colonoscopy or flexible sigmoidoscopy) you may notice spotting of blood in your stool or on the toilet paper. If you underwent a bowel prep for your procedure, you may not have a normal bowel movement for a few days.  Please Note:  You might notice some irritation and congestion in your nose or some drainage.  This is from the oxygen used during your procedure.  There is no need for concern and it should clear up in a day or so.  SYMPTOMS TO REPORT IMMEDIATELY:   Following lower endoscopy (colonoscopy or flexible sigmoidoscopy):  Excessive amounts of blood in the stool  Significant tenderness or worsening of abdominal pains  Swelling of the abdomen that is new, acute  Fever of 100F or higher   Following upper endoscopy (EGD)  Vomiting of blood or coffee ground material  New chest pain or pain under the shoulder blades  Painful or persistently difficult swallowing  New shortness of breath  Fever of 100F or higher  Black, tarry-looking stools  For urgent or emergent issues, a gastroenterologist can be reached at any hour by calling [phone].   DIET:  We do recommend a small meal at first, but then you may proceed to your regular diet.  Drink  plenty of fluids but you should avoid alcoholic beverages for 24 hours.  ACTIVITY:  You should plan to take it easy for the rest of today and you should NOT DRIVE or use heavy machinery until tomorrow (because of the sedation medicines used during the test).    FOLLOW UP: Our staff will call the number listed on your records 48-72 hours following your procedure to check on you and address any questions or concerns that you may have regarding the information given to you following your procedure. If we do not reach you, we will leave a message.  We will attempt to reach you two times.  During this call, we will ask if you have developed any symptoms of COVID 19. If you develop any symptoms (ie: fever, flu-like symptoms, shortness of breath, cough etc.) before then, please call [phone].  If you test positive for Covid 19 in the 2 weeks post procedure, please call and report this information to us.    If any biopsies were taken you will be contacted by phone or by letter within the next 1-3 weeks.  Please call us at [phone] if you have not heard about the biopsies in 3 weeks.    SIGNATURES/CONFIDENTIALITY: You and/or your care partner have signed paperwork which will be entered into your electronic medical record.  These signatures attest to the fact that that the information above on your After Visit Summary has been reviewed and is   understood.  Full responsibility of the confidentiality of this discharge information lies with you and/or your care-partner.   Resume medications. Information given on polyps,diverticulosis,and hemorrhoids. Office will contact you for scheduling of abdominal ultrasound.

## 2019-12-07 NOTE — Op Note (Signed)
[facility] Patient Name: Peter Taylor Procedure Date: 12/07/2019 12:52 PM MRN: [id number] Endoscopist: Docia Chuck. Henrene Pastor , MD Age: 69 Referring MD:  Date of Birth: Dec 23, 1950 Gender: Male Account #: [id number] Procedure:                Upper GI endoscopy Indications:              Dyspepsia, Abdominal bloating Medicines:                Monitored Anesthesia Care Procedure:                Pre-Anesthesia Assessment:                           - Prior to the procedure, a History and Physical                            was performed, and patient medications and                            allergies were reviewed. The patient's tolerance of                            previous anesthesia was also reviewed. The risks                            and benefits of the procedure and the sedation                            options and risks were discussed with the patient.                            All questions were answered, and informed consent                            was obtained. Prior Anticoagulants: The patient has                            taken no previous anticoagulant or antiplatelet                            agents. ASA Grade Assessment: II - A patient with                            mild systemic disease. After reviewing the risks                            and benefits, the patient was deemed in                            satisfactory condition to undergo the procedure.                           After obtaining informed consent, the endoscope was  passed under direct vision. Throughout the                            procedure, the patient's blood pressure, pulse, and                            oxygen saturations were monitored continuously. The                            Endoscope was introduced through the mouth, and                            advanced to the second part of duodenum. The upper                            GI endoscopy was accomplished  without difficulty.                            The patient tolerated the procedure well. Scope In: Scope Out: Findings:                 The esophagus was normal.                           The stomach was normal.                           The examined duodenum was normal.                           The cardia and gastric fundus were normal on                            retroflexion. Complications:            No immediate complications. Estimated Blood Loss:     Estimated blood loss: none. Impression:               - Normal esophagus.                           - Normal stomach.                           - Normal examined duodenum.                           - No specimens collected. Recommendation:           - Patient has a contact number available for                            emergencies. The signs and symptoms of potential                            delayed complications were discussed with the  patient. Return to normal activities tomorrow.                            Written discharge instructions were provided to the                            patient.                           - Resume previous diet.                           - Continue present medications.                           - Schedule abdominal ultrasound "dyspepsia and                            bloating discomfort, evaluate" Shooter Tangen N. Henrene Pastor, MD 12/07/2019 1:49:50 PM This report has been signed electronically.

## 2019-12-08 ENCOUNTER — Telehealth: Payer: Self-pay

## 2019-12-08 ENCOUNTER — Other Ambulatory Visit (HOSPITAL_COMMUNITY)
Admission: RE | Admit: 2019-12-08 | Discharge: 2019-12-08 | Disposition: A | Discharge status: Home / Self Care | Payer: Medicare HMO | Source: Ambulatory Visit | Attending: Interventional Cardiology | Admitting: Interventional Cardiology

## 2019-12-08 ENCOUNTER — Other Ambulatory Visit: Payer: Self-pay

## 2019-12-08 DIAGNOSIS — R1013 Epigastric pain: Secondary | ICD-10-CM

## 2019-12-08 DIAGNOSIS — Z01812 Encounter for preprocedural laboratory examination: Secondary | ICD-10-CM | POA: Insufficient documentation

## 2019-12-08 DIAGNOSIS — Z20822 Contact with and (suspected) exposure to covid-19: Secondary | ICD-10-CM | POA: Insufficient documentation

## 2019-12-08 DIAGNOSIS — R140 Abdominal distension (gaseous): Secondary | ICD-10-CM

## 2019-12-08 LAB — SARS CORONAVIRUS 2 (TAT 6-24 HRS): SARS Coronavirus 2: NEGATIVE

## 2019-12-08 NOTE — Telephone Encounter (Signed)
Left message for pt to call back. Pt scheduled for [place] at [facility] 2/16@10am , arrival time 9:45am. Pt to be NPO after midnight.  Pt aware.

## 2019-12-09 ENCOUNTER — Telehealth: Payer: Self-pay

## 2019-12-09 ENCOUNTER — Ambulatory Visit: Payer: Medicare HMO | Admitting: Family Medicine

## 2019-12-09 ENCOUNTER — Telehealth: Payer: Self-pay | Admitting: *Deleted

## 2019-12-09 NOTE — Telephone Encounter (Signed)
Voicemail message, no answer. 

## 2019-12-09 NOTE — Telephone Encounter (Signed)
  Follow up Call-  Call back number 12/07/2019  Post procedure Call Back phone  # [phone]  Permission to leave phone message Yes  Some recent data might be hidden     Patient questions:  Do you have a fever, pain , or abdominal swelling? No. Pain Score  0 *  Have you tolerated food without any problems? Yes.    Have you been able to return to your normal activities? Yes.    Do you have any questions about your discharge instructions: Diet   No. Medications  No. Follow up visit  No.  Do you have questions or concerns about your Care? No.  Actions: * If pain score is 4 or above: No action needed, pain <4. 1. Have you developed a fever since your procedure? no  2.   Have you had an respiratory symptoms (SOB or cough) since your procedure? no  3.   Have you tested positive for COVID 19 since your procedure no  4.   Have you had any family members/close contacts diagnosed with the COVID 19 since your procedure?  no   If yes to any of these questions please route to Joylene John, RN and Alphonsa Gin, Therapist, sports.

## 2019-12-09 NOTE — Telephone Encounter (Addendum)
Pt contacted pre-catheterization scheduled at [facility] for: Friday December 10, 2019 10:30 AM Verified arrival time and place: [facility]) at: 8:30 AM   No solid food after midnight prior to cath, clear liquids until 5 AM day of procedure. Contrast allergy: no  AM meds can be  taken pre-cath with sip of water including: ASA 81 mg   Confirmed patient has responsible adult to drive home post procedure and observe 24 hours after arriving home: yes  Currently, due to Covid-19 pandemic, only one person will be allowed with patient. Must be the same person for patient's entire stay and will be required to wear a mask. They will be asked to wait in the waiting room for the duration of the patient's stay.  Patients are required to wear a mask when they enter the hospital.  [facility] to review procedure instructions

## 2019-12-09 NOTE — Telephone Encounter (Signed)
    COVID-19 Pre-Screening Questions:  . In the past 7 to 10 days have you had a cough,  shortness of breath, headache, congestion, fever (100 or greater) body aches, chills, sore throat, or sudden loss of taste or sense of smell? no . Have you been around anyone with known Covid 19 in the past 7-10 days? no . Have you been around anyone who is awaiting Covid 19 test results in the past 7 to 10 days? no . Have you been around anyone who has been exposed to Covid 19, or has mentioned symptoms of Covid 19 within the past 7 to 10 days? No   I reviewed procedure/mask/visitor instructions, COVID-19 screening questions with patient, he verbalized understanding, thanked me for call.

## 2019-12-10 ENCOUNTER — Encounter (HOSPITAL_COMMUNITY): Payer: Self-pay | Admitting: Interventional Cardiology

## 2019-12-10 ENCOUNTER — Ambulatory Visit (HOSPITAL_COMMUNITY)
Admission: RE | Admit: 2019-12-10 | Discharge: 2019-12-10 | Disposition: A | Discharge status: Home / Self Care | Payer: Medicare HMO | Attending: Interventional Cardiology | Admitting: Interventional Cardiology

## 2019-12-10 ENCOUNTER — Encounter (HOSPITAL_COMMUNITY)
Admission: RE | Disposition: A | Discharge status: Home / Self Care | Payer: Self-pay | Source: Home / Self Care | Attending: Interventional Cardiology

## 2019-12-10 ENCOUNTER — Other Ambulatory Visit: Payer: Self-pay

## 2019-12-10 DIAGNOSIS — Z79899 Other long term (current) drug therapy: Secondary | ICD-10-CM | POA: Insufficient documentation

## 2019-12-10 DIAGNOSIS — K219 Gastro-esophageal reflux disease without esophagitis: Secondary | ICD-10-CM | POA: Diagnosis not present

## 2019-12-10 DIAGNOSIS — Z87891 Personal history of nicotine dependence: Secondary | ICD-10-CM | POA: Diagnosis not present

## 2019-12-10 DIAGNOSIS — E785 Hyperlipidemia, unspecified: Secondary | ICD-10-CM | POA: Insufficient documentation

## 2019-12-10 DIAGNOSIS — Z955 Presence of coronary angioplasty implant and graft: Secondary | ICD-10-CM | POA: Diagnosis not present

## 2019-12-10 DIAGNOSIS — I25119 Atherosclerotic heart disease of native coronary artery with unspecified angina pectoris: Secondary | ICD-10-CM | POA: Insufficient documentation

## 2019-12-10 DIAGNOSIS — Z880 Allergy status to penicillin: Secondary | ICD-10-CM | POA: Diagnosis not present

## 2019-12-10 DIAGNOSIS — I25118 Atherosclerotic heart disease of native coronary artery with other forms of angina pectoris: Secondary | ICD-10-CM | POA: Diagnosis not present

## 2019-12-10 DIAGNOSIS — I2510 Atherosclerotic heart disease of native coronary artery without angina pectoris: Secondary | ICD-10-CM

## 2019-12-10 DIAGNOSIS — Z95820 Peripheral vascular angioplasty status with implants and grafts: Secondary | ICD-10-CM

## 2019-12-10 HISTORY — PX: LEFT HEART CATH AND CORONARY ANGIOGRAPHY: CATH118249

## 2019-12-10 HISTORY — PX: CORONARY STENT INTERVENTION: CATH118234

## 2019-12-10 HISTORY — DX: Atherosclerotic heart disease of native coronary artery without angina pectoris: I25.10

## 2019-12-10 LAB — POCT ACTIVATED CLOTTING TIME: Activated Clotting Time: 335 seconds

## 2019-12-10 SURGERY — LEFT HEART CATH AND CORONARY ANGIOGRAPHY
Anesthesia: LOCAL

## 2019-12-10 MED ORDER — SODIUM CHLORIDE 0.9% FLUSH: 3.0000 mL | Freq: Two times a day (BID) | INTRAVENOUS | Status: DC

## 2019-12-10 MED ORDER — CLOPIDOGREL BISULFATE 75 MG PO TABS: 75.0000 mg | ORAL_TABLET | Freq: Every day | ORAL | 11 refills | Status: DC

## 2019-12-10 MED ORDER — PANTOPRAZOLE SODIUM 40 MG PO TBEC: 40.0000 mg | DELAYED_RELEASE_TABLET | Freq: Every day | ORAL | 6 refills | Status: DC

## 2019-12-10 MED ORDER — TIROFIBAN (AGGRASTAT) BOLUS VIA INFUSION
INTRAVENOUS | Status: DC | PRN
  Administered 2019-12-10: 2187.5 ug via INTRAVENOUS

## 2019-12-10 MED ORDER — ONDANSETRON HCL 4 MG/2ML IJ SOLN
INTRAMUSCULAR | Status: DC | PRN
  Administered 2019-12-10: 4 mg via INTRAVENOUS

## 2019-12-10 MED ORDER — SODIUM CHLORIDE 0.9% FLUSH: 3.0000 mL | INTRAVENOUS | Status: DC | PRN

## 2019-12-10 MED ORDER — TIROFIBAN HCL IN NACL 5-0.9 MG/100ML-% IV SOLN
INTRAVENOUS | Status: AC
  Filled 2019-12-10: qty 100

## 2019-12-10 MED ORDER — CLOPIDOGREL BISULFATE 300 MG PO TABS
ORAL_TABLET | ORAL | Status: AC
  Filled 2019-12-10: qty 2

## 2019-12-10 MED ORDER — CLOPIDOGREL BISULFATE 75 MG PO TABS: 75.0000 mg | ORAL_TABLET | Freq: Every day | ORAL | Status: DC

## 2019-12-10 MED ORDER — ASPIRIN 81 MG PO CHEW: 81.0000 mg | CHEWABLE_TABLET | ORAL | Status: DC

## 2019-12-10 MED ORDER — HYDRALAZINE HCL 20 MG/ML IJ SOLN: 10.0000 mg | INTRAMUSCULAR | Status: DC | PRN

## 2019-12-10 MED ORDER — BIVALIRUDIN BOLUS VIA INFUSION - CUPID: INTRAVENOUS | Status: DC | PRN

## 2019-12-10 MED ORDER — FAMOTIDINE IN NACL 20-0.9 MG/50ML-% IV SOLN
INTRAVENOUS | Status: AC
  Filled 2019-12-10: qty 50

## 2019-12-10 MED ORDER — MIDAZOLAM HCL 2 MG/2ML IJ SOLN
INTRAMUSCULAR | Status: AC
  Filled 2019-12-10: qty 2

## 2019-12-10 MED ORDER — SODIUM CHLORIDE 0.9 % WEIGHT BASED INFUSION: 1.0000 mL/kg/h | INTRAVENOUS | Status: DC

## 2019-12-10 MED ORDER — SODIUM CHLORIDE 0.9 % WEIGHT BASED INFUSION
3.0000 mL/kg/h | INTRAVENOUS | Status: AC
  Administered 2019-12-10: 09:00:00 3 mL/kg/h via INTRAVENOUS

## 2019-12-10 MED ORDER — SODIUM CHLORIDE 0.9 % IV SOLN
INTRAVENOUS | Status: AC | PRN
  Administered 2019-12-10: 250 mL via INTRAVENOUS

## 2019-12-10 MED ORDER — CLOPIDOGREL BISULFATE 300 MG PO TABS
300.0000 mg | ORAL_TABLET | Freq: Once | ORAL | Status: AC
  Administered 2019-12-10: 300 mg via ORAL
  Filled 2019-12-10: qty 1

## 2019-12-10 MED ORDER — FAMOTIDINE IN NACL 20-0.9 MG/50ML-% IV SOLN
INTRAVENOUS | Status: AC | PRN
  Administered 2019-12-10: 20 mg via INTRAVENOUS

## 2019-12-10 MED ORDER — SODIUM CHLORIDE 0.9 % IV SOLN: INTRAVENOUS | Status: AC

## 2019-12-10 MED ORDER — VERAPAMIL HCL 2.5 MG/ML IV SOLN
INTRAVENOUS | Status: DC | PRN
  Administered 2019-12-10: 10 mL via INTRA_ARTERIAL

## 2019-12-10 MED ORDER — CLOPIDOGREL BISULFATE 300 MG PO TABS
ORAL_TABLET | ORAL | Status: DC | PRN
  Administered 2019-12-10: 600 mg via ORAL

## 2019-12-10 MED ORDER — FENTANYL CITRATE (PF) 100 MCG/2ML IJ SOLN
INTRAMUSCULAR | Status: AC
  Filled 2019-12-10: qty 2

## 2019-12-10 MED ORDER — HEPARIN SODIUM (PORCINE) 1000 UNIT/ML IJ SOLN
INTRAMUSCULAR | Status: AC
  Filled 2019-12-10: qty 1

## 2019-12-10 MED ORDER — NITROGLYCERIN 1 MG/10 ML FOR IR/CATH LAB
INTRA_ARTERIAL | Status: AC
  Filled 2019-12-10: qty 10

## 2019-12-10 MED ORDER — ASPIRIN 81 MG PO CHEW: 81.0000 mg | CHEWABLE_TABLET | Freq: Every day | ORAL | Status: DC

## 2019-12-10 MED ORDER — FENTANYL CITRATE (PF) 100 MCG/2ML IJ SOLN
INTRAMUSCULAR | Status: DC | PRN
  Administered 2019-12-10 (×2): 25 ug via INTRAVENOUS

## 2019-12-10 MED ORDER — ONDANSETRON HCL 4 MG/2ML IJ SOLN: 4.0000 mg | Freq: Four times a day (QID) | INTRAMUSCULAR | Status: DC | PRN

## 2019-12-10 MED ORDER — ACETAMINOPHEN 325 MG PO TABS: 650.0000 mg | ORAL_TABLET | ORAL | Status: DC | PRN

## 2019-12-10 MED ORDER — VERAPAMIL HCL 2.5 MG/ML IV SOLN
INTRAVENOUS | Status: AC
  Filled 2019-12-10: qty 2

## 2019-12-10 MED ORDER — MIDAZOLAM HCL 2 MG/2ML IJ SOLN
INTRAMUSCULAR | Status: DC | PRN
  Administered 2019-12-10: 1 mg via INTRAVENOUS
  Administered 2019-12-10: 2 mg via INTRAVENOUS

## 2019-12-10 MED ORDER — NITROGLYCERIN 1 MG/10 ML FOR IR/CATH LAB
INTRA_ARTERIAL | Status: DC | PRN
  Administered 2019-12-10: 400 ug via INTRA_ARTERIAL
  Administered 2019-12-10: 200 ug via INTRACORONARY

## 2019-12-10 MED ORDER — HEPARIN (PORCINE) IN NACL 1000-0.9 UT/500ML-% IV SOLN
INTRAVENOUS | Status: AC
  Filled 2019-12-10: qty 1000

## 2019-12-10 MED ORDER — IOHEXOL 350 MG/ML SOLN
INTRAVENOUS | Status: DC | PRN
  Administered 2019-12-10: 12:00:00 100 mL

## 2019-12-10 MED ORDER — ONDANSETRON HCL 4 MG/2ML IJ SOLN
INTRAMUSCULAR | Status: AC
  Filled 2019-12-10: qty 2

## 2019-12-10 MED ORDER — SODIUM CHLORIDE 0.9 % IV SOLN: 250.0000 mL | INTRAVENOUS | Status: DC | PRN

## 2019-12-10 MED ORDER — LIDOCAINE HCL (PF) 1 % IJ SOLN
INTRAMUSCULAR | Status: DC | PRN
  Administered 2019-12-10: 2 mL via INTRADERMAL

## 2019-12-10 MED ORDER — HEPARIN SODIUM (PORCINE) 1000 UNIT/ML IJ SOLN
INTRAMUSCULAR | Status: DC | PRN
  Administered 2019-12-10: 2000 [IU] via INTRAVENOUS
  Administered 2019-12-10: 7000 [IU] via INTRAVENOUS
  Administered 2019-12-10: 4500 [IU] via INTRAVENOUS

## 2019-12-10 MED ORDER — LIDOCAINE HCL (PF) 1 % IJ SOLN
INTRAMUSCULAR | Status: AC
  Filled 2019-12-10: qty 30

## 2019-12-10 MED ORDER — LABETALOL HCL 5 MG/ML IV SOLN: 10.0000 mg | INTRAVENOUS | Status: DC | PRN

## 2019-12-10 MED FILL — PANTOPRAZOLE SOD DR 40 MG T: 40 | 30 days supply | Qty: 30 | Fill #0

## 2019-12-10 MED FILL — CLOPIDOGREL 75 MG TABLET: 75 | 30 days supply | Qty: 30 | Fill #0

## 2019-12-10 SURGICAL SUPPLY — 18 items
BALLN SAPPHIRE 2.5X15 (BALLOONS) ×2
BALLN SAPPHIRE ~~LOC~~ 3.5X15 (BALLOONS) ×1 IMPLANT
BALLOON SAPPHIRE 2.5X15 (BALLOONS) IMPLANT
CATH 5FR JL3.5 JR4 ANG PIG MP (CATHETERS) ×1 IMPLANT
CATH LAUNCHER 6FR EBU3.5 (CATHETERS) ×1 IMPLANT
DEVICE RAD COMP TR BAND LRG (VASCULAR PRODUCTS) ×1 IMPLANT
GLIDESHEATH SLEND SS 6F .021 (SHEATH) ×1 IMPLANT
GUIDEWIRE INQWIRE 1.5J.035X260 (WIRE) IMPLANT
INQWIRE 1.5J .035X260CM (WIRE) ×2
KIT ENCORE 26 ADVANTAGE (KITS) ×1 IMPLANT
KIT HEART LEFT (KITS) ×2 IMPLANT
KIT HEMO VALVE WATCHDOG (MISCELLANEOUS) ×1 IMPLANT
PACK CARDIAC CATHETERIZATION (CUSTOM PROCEDURE TRAY) ×2 IMPLANT
SHEATH PROBE COVER 6X72 (BAG) ×1 IMPLANT
STENT RESOLUTE ONYX 3.0X22 (Permanent Stent) ×1 IMPLANT
TRANSDUCER W/STOPCOCK (MISCELLANEOUS) ×2 IMPLANT
TUBING CIL FLEX 10 FLL-RA (TUBING) ×2 IMPLANT
WIRE ASAHI PROWATER 180CM (WIRE) ×1 IMPLANT

## 2019-12-10 NOTE — Discharge Instructions (Signed)
No driving for 48 hours. No lifting over 5 lbs for 1 week. No sexual activity for 1 week. You may return to work on 12/14/19. Keep procedure site clean & dry. If you notice increased pain, swelling, bleeding or pus, call/return!  You may shower, but no soaking baths/hot tubs/pools for 1 week.      Radial Site Care  This sheet gives you information about how to care for yourself after your procedure. Your health care provider may also give you more specific instructions. If you have problems or questions, contact your health care provider. What can I expect after the procedure? After the procedure, it is common to have:  Bruising and tenderness at the catheter insertion area. Follow these instructions at home: Medicines  Take over-the-counter and prescription medicines only as told by your health care provider. Insertion site care  Follow instructions from your health care provider about how to take care of your insertion site. Make sure you: ? Wash your hands with soap and water before you change your bandage (dressing). If soap and water are not available, use hand sanitizer. ? Change your dressing as told by your health care provider. ? Leave stitches (sutures), skin glue, or adhesive strips in place. These skin closures may need to stay in place for 2 weeks or longer. If adhesive strip edges start to loosen and curl up, you may trim the loose edges. Do not remove adhesive strips completely unless your health care provider tells you to do that.  Check your insertion site every day for signs of infection. Check for: ? Redness, swelling, or pain. ? Fluid or blood. ? Pus or a bad smell. ? Warmth.  Do not take baths, swim, or use a hot tub until your health care provider approves.  You may shower 24-48 hours after the procedure, or as directed by your health care provider. ? Remove the dressing and gently wash the site with plain soap and water. ? Pat the area dry with a clean  towel. ? Do not rub the site. That could cause bleeding.  Do not apply powder or lotion to the site. Activity   For 24 hours after the procedure, or as directed by your health care provider: ? Do not flex or bend the affected arm. ? Do not push or pull heavy objects with the affected arm. ? Do not drive yourself home from the hospital or clinic. You may drive 24 hours after the procedure unless your health care provider tells you not to. ? Do not operate machinery or power tools.  Do not lift anything that is heavier than 10 lb (4.5 kg), or the limit that you are told, until your health care provider says that it is safe.  Ask your health care provider when it is okay to: ? Return to work or school. ? Resume usual physical activities or sports. ? Resume sexual activity. General instructions  If the catheter site starts to bleed, raise your arm and put firm pressure on the site. If the bleeding does not stop, get help right away. This is a medical emergency.  If you went home on the same day as your procedure, a responsible adult should be with you for the first 24 hours after you arrive home.  Keep all follow-up visits as told by your health care provider. This is important. Contact a health care provider if:  You have a fever.  You have redness, swelling, or yellow drainage around your insertion site. Get  help right away if:  You have unusual pain at the radial site.  The catheter insertion area swells very fast.  The insertion area is bleeding, and the bleeding does not stop when you hold steady pressure on the area.  Your arm or hand becomes pale, cool, tingly, or numb. These symptoms may represent a serious problem that is an emergency. Do not wait to see if the symptoms will go away. Get medical help right away. Call your local emergency services (911 in the [place] Do not drive yourself to the hospital. Summary  After the procedure, it is common to have bruising and  tenderness at the site.  Follow instructions from your health care provider about how to take care of your radial site wound. Check the wound every day for signs of infection.  Do not lift anything that is heavier than 10 lb (4.5 kg), or the limit that you are told, until your health care provider says that it is safe. This information is not intended to replace advice given to you by your health care provider. Make sure you discuss any questions you have with your health care provider. Document Revised: 11/19/2017 Document Reviewed: 11/19/2017 Elsevier Patient Education  2020 [employer].

## 2019-12-10 NOTE — Discharge Summary (Addendum)
Discharge Summary for Same Day PCI   Patient ID: Peter Taylor MRN: [id number]; DOB: 10/31/1950  Admit date: 12/10/2019 Discharge date: 12/10/2019  Primary Care Provider: Wendie Agreste, MD  Primary Cardiologist: Larae Grooms, MD  Primary Electrophysiologist:  None   Discharge Diagnoses    Active Problems:   Coronary artery disease   GERD   HLD  Diagnostic Studies/Procedures    Cardiac Catheterization 12/10/2019:  CORONARY STENT INTERVENTION  LEFT HEART CATH AND CORONARY ANGIOGRAPHY  Conclusion     Mid LAD lesion is 90% stenosed.  A drug-eluting stent was successfully placed using a STENT RESOLUTE ONYX 3.0X22.  Post intervention, there is a 0% residual stenosis.  The left ventricular systolic function is normal.  LV end diastolic pressure is normal.  The left ventricular ejection fraction is 55-65% by visual estimate.  There is no aortic valve stenosis.  Mild, nonobstructive disease in the RCA and circumflex.   Continue dual antiplatelet therapy along with aggressive secondary prevention.    Plan for same day discharge.    Before leaving the room, the patient became nauseated and vomited his Plavix.  A bolus only of tirofiban was given and then he will be given a dose of crushed Plavix 300 mg PO x 1 in the holding area.  Will continue to plan for same day PCI.    Diagnostic Dominance: Right  Intervention      History of Present Illness     Peter Taylor is a 69 y.o. male with hx of HLD, prior smoker (quit 2013) and GERD presents for outpatient cath.   Seen by Dr. Irish Lack 11/11/19 for progressive worsening chest pain. Some relief with antiacid and PPI. He does not exercise much.  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 RF for CAD (age, male gender, 58 pack years smoking history- quit in 2013).  CT coronary showed multivessel CAD and FFR showing significant flow limiting stenosis of  the proximal-mid LAD at the area of mixed plaque.  Cardiac catheterization was arranged for further evaluation.  Hospital Course     The patient underwent cardiac cath as noted above with 90% mLAD s/p drug-eluting stent was successfully placed using a STENT RESOLUTE ONYX 3.0X22. Plan for DAPT with ASA/ Plavix. The patient was seen by cardiac rehab while in short stay. There were no observed complications post cath. Radial cath site was re-evaluated prior to discharge and found to be stable without any complications. Instructions/precautions regarding cath site care were given prior to discharge.  11/10/2019: Cholesterol, Total 235; HDL 55; LDL Chol Calc (NIH) 163; Triglycerides 96  LDL goal less than 70. Consider outpatient up titration.   Peter Taylor was seen by Dr. Irish Lack and determined stable for discharge home. Follow up with our office has been arranged. Medications are listed below. Pertinent changes include  Addition of Plavix and change Omeprazole to pantoprazole.   Cath/PCI Registry Performance & Quality Measures: 1. Aspirin prescribed? - Yes 2. ADP Receptor Inhibitor (Plavix/Clopidogrel, Brilinta/Ticagrelor or Effient/Prasugrel) prescribed (includes medically managed patients)? - Yes 3. High Intensity Statin (Lipitor 40-80mg  or Crestor 20-40mg ) prescribed? - Yes - outpatient titration  4. For EF <40%, was ACEI/ARB prescribed? - Not Applicable (EF >/= [id number]) 5. For EF <40%, Aldosterone Antagonist (Spironolactone or Eplerenone) prescribed? - Not Applicable (EF >/= [id number]) 6. Cardiac Rehab Phase II ordered (Included Medically managed Patients)? - Yes  _____________   Discharge Vitals Blood pressure 121/79,  pulse 68, temperature 98.5 F (36.9 C), temperature source Oral, resp. rate 17, height 5\' 11"  (1.803 m), weight 87.5 kg, SpO2 99 %.  Filed Weights   12/10/19 0816  Weight: 87.5 kg    Last Labs & Radiologic Studies   _____________  CARDIAC CATHETERIZATION  Addendum Date:  12/10/2019    Mid LAD lesion is 90% stenosed.  A drug-eluting stent was successfully placed using a STENT RESOLUTE ONYX 3.0X22.  Post intervention, there is a 0% residual stenosis.  The left ventricular systolic function is normal.  LV end diastolic pressure is normal.  The left ventricular ejection fraction is 55-65% by visual estimate.  There is no aortic valve stenosis.  Mild, nonobstructive disease in the RCA and circumflex.  Continue dual antiplatelet therapy along with aggressive secondary prevention. Plan for same day discharge. Before leaving the room, the patient became nauseated and vomited his Plavix.  A bolus only of tirofiban was given and then he will be given a dose of crushed Plavix 300 mg PO x 1 in the holding area.  Will continue to plan for same day PCI.   Result Date: 12/10/2019  Mid LAD lesion is 90% stenosed.  A drug-eluting stent was successfully placed using a STENT RESOLUTE ONYX 3.0X22.  Post intervention, there is a 0% residual stenosis.  The left ventricular systolic function is normal.  LV end diastolic pressure is normal.  The left ventricular ejection fraction is 55-65% by visual estimate.  There is no aortic valve stenosis.  Mild, nonobstructive disease in the RCA and circumflex.  Continue dual antiplatelet therapy along with aggressive secondary prevention. Plan for same day discharge.   CT CORONARY MORPH W/CTA COR W/SCORE W/CA W/CM &/OR WO/CM  Addendum Date: 11/30/2019   ADDENDUM REPORT: 11/30/2019 17:30 HISTORY: 69 yo male with nonspecific chest pain EXAM: Cardiac/Coronary CTA TECHNIQUE: The patient was scanned on a [employer]. PROTOCOL: A 120 kV prospective scan was triggered in the descending thoracic aorta at 111 HU's. Axial non-contrast 3 mm slices were carried out through the heart. The data set was analyzed on a dedicated work station and scored using the [employer]. Gantry rotation speed was 250 msecs and collimation was .6 mm. Beta  blockade and 0.8 mg of sl NTG was given. The 3D data set was reconstructed in 5% intervals of the 67-82 % of the R-R cycle. Diastolic phases were analyzed on a dedicated work station using MPR, MIP and VRT modes. The patient received 28mL OMNIPAQUE IOHEXOL 350 MG/ML SOLN of contrast. FINDINGS: Quality: Good (HR 58) Coronary calcium score: The patient's coronary artery calcium score is 131, which places the patient in the 77th percentile. Coronary arteries: Normal coronary origins.  Right dominance. Right Coronary Artery: Dominant vessel. Gives off the R-PDA and R-PLB branches. Minimal 1-24% mixed proximal stenosis (CADRADS1). Mild distal non-calcified stenosis 25-49% (CADRADS2/V). There is a mild, non-calcified, positively remodeled plaque of the proximal R-PDA (CADRADS2R/V). The R-PLB branch is without stenosis. Left Main Coronary Artery: Normal. Bifurcates into the LAD and LCx arteries. Left Anterior Descending Coronary Artery: There is 15 mm segment of complex, moderate mixed proximal stenosis, 50-69% (CADRADS3). The distal LAD has mild 25-49% (CADRADS2) non-calcified stenosis. Left Circumflex Artery: AV groove vessel, minimal 1-24% distal stenosis. The proximal OM1 is a moderate sized vessel with mild 25-49% (CADRADS2) ostial stenosis. Aorta: Normal size, 30 mm at the mid ascending aorta (level of the PA bifurcation) measured double oblique. No calcifications. No dissection. Aortic Valve: Trileaflet. No calcifications. Other findings:  Normal pulmonary vein drainage into the left atrium. Normal left atrial appendage without a thrombus. Normal size of the pulmonary artery. IMPRESSION: 1. Multivessel CAD with moderate, complex mixed proximal LAD stenosis and 2 discrete mild positively remodelled plaques of the RCA, CADRADS = 3/V. CT FFR will be performed and reported separately. 2. Coronary calcium score of 131. This was 77th percentile for age and sex matched control. Calcium is noted in all 3 major vessels. 3.  Normal coronary origin with right dominance. Electronically Signed   By: Pixie Casino M.D.   On: 11/30/2019 17:30   Result Date: 11/30/2019 EXAM: OVER-READ INTERPRETATION  CT CHEST The following report is an over-read performed by radiologist Dr. Aletta Edouard of [facility], [place] on 11/30/2019. This over-read does not include interpretation of cardiac or coronary anatomy or pathology. The coronary CTA interpretation by the cardiologist is attached. COMPARISON:  None. FINDINGS: Vascular: No incidental findings. Mediastinum/Nodes: Visualized mediastinum and hilar regions demonstrate no lymphadenopathy or masses. Lungs/Pleura: There is some scarring in the right lower lobe. Visualized lungs show no evidence of pulmonary edema, consolidation, pneumothorax, nodule or pleural fluid. Upper Abdomen: No acute abnormality. Musculoskeletal: No chest wall mass or suspicious bone lesions identified. IMPRESSION: No significant incidental findings. Electronically Signed: By: Aletta Edouard M.D. On: 11/30/2019 11:35   CT CORONARY FRACTIONAL FLOW RESERVE DATA PREP  Result Date: 11/30/2019 EXAM: CT FFR ANALYSIS HISTORY OF PRESENT ILLNESS: 69 yo male with chest pain, abnormal CT coronary angiogram PROCEDURE: FFRct analysis was performed on the original cardiac CT angiogram dataset. Diagrammatic representation of the FFRct analysis is provided in a separate PDF document in PACS. This dictation was created using the PDF document and an interactive 3D model of the results. 3D model is not available in the EMR/PACS. Normal FFR range is >0.80. FINDINGS: 1. Left Main:  No significant stenosis. FFR = 0.98 2. LAD: Significant stenosis with acute FFR drop. Proximal FFR = 0.96, Mid FFR = 0.55, Distal FFR = 0.54 3. LCX: No significant stenosis. Proximal FFR = 0.97, Distal FFR = 0.91 4. OM1: No significant stenosis.  Distal FFR = 0.93 5. RCA: No significant stenosis. Proximal FFR = 0.98, Mid FFR = 0.94, Distal FFR = 0.92  IMPRESSION: 1. CT FFR demonstrates significant flow-limiting stenosis of the proximal-mid LAD at the area of mixed plaque. 2. Cardiac catheterization is recommended along with aggressive risk factor modification given chest pain symptoms. Electronically Signed   By: Pixie Casino M.D.   On: 11/30/2019 21:43    Disposition   Pt is being discharged home today in good condition.  Follow-up Plans & Appointments    Follow-up Information     Jettie Booze, MD. Go on 12/24/2019.   Specialties: Cardiology, Radiology, Interventional Cardiology Why: @2 :40pm for cath follow up  Contact information: [address] [place] [address] [phone]           Discharge Instructions     Amb Referral to Cardiac Rehabilitation   Complete by: As directed    Diagnosis: Coronary Stents   After initial evaluation and assessments completed: Virtual Based Care may be provided alone or in conjunction with Phase 2 Cardiac Rehab based on patient barriers.: Yes      Discharge Medications   Allergies as of 12/10/2019       Reactions   Penicillins Hives   Did it involve swelling of the face/tongue/throat, SOB, or low BP? No Did it involve sudden or severe rash/hives, skin peeling, or  any reaction on the inside of your mouth or nose? No Did you need to seek medical attention at a hospital or doctor's office? No When did it last happen?      35+ years If all above answers are "NO", may proceed with cephalosporin use.        Medication List     STOP taking these medications    acetaminophen 500 MG tablet Commonly known as: TYLENOL   omeprazole 20 MG capsule Commonly known as: PRILOSEC       TAKE these medications    aspirin EC 81 MG tablet Take 1 tablet (81 mg total) by mouth daily.   atorvastatin 20 MG tablet Commonly known as: LIPITOR Take 1 tablet (20 mg total) by mouth daily.   clopidogrel 75 MG tablet Commonly known as: PLAVIX Take 1 tablet (75  mg total) by mouth daily with breakfast. Start taking on: December 11, 2019   isosorbide mononitrate 30 MG 24 hr tablet Commonly known as: IMDUR Take 1 tablet (30 mg total) by mouth daily.   metoprolol tartrate 25 MG tablet Commonly known as: LOPRESSOR Take 1 tablet by mouth 2 hours prior to Cardiac CT   nitroGLYCERIN 0.4 MG SL tablet Commonly known as: NITROSTAT Place 1 tablet (0.4 mg total) under the tongue every 5 (five) minutes as needed for chest pain.   pantoprazole 40 MG tablet Commonly known as: Protonix Take 1 tablet (40 mg total) by mouth daily.           Allergies Allergies  Allergen Reactions  . Penicillins Hives    Did it involve swelling of the face/tongue/throat, SOB, or low BP? No Did it involve sudden or severe rash/hives, skin peeling, or any reaction on the inside of your mouth or nose? No Did you need to seek medical attention at a hospital or doctor's office? No When did it last happen?      35+ years If all above answers are "NO", may proceed with cephalosporin use.     Outstanding Labs/Studies   None  Duration of Discharge Encounter   Greater than 30 minutes including physician time.  Signed, Kathyrn Drown, NP 12/10/2019, 4:55 PM   I have examined the patient and reviewed assessment and plan and discussed with patient.  Agree with above as stated.    Right wrist stable.  No hematoma.  I stressed the importance of bending his right wrist and taking his DAPT.  Discussed with family as well.   OK for discharge later today.  Larae Grooms

## 2019-12-10 NOTE — Progress Notes (Signed)
[id number] Education completed with pt who voiced understanding. Stressed importance of plavix with stent. Reviewed NTG use, heart healthy food choices, walking for exercise, CRP 2, and carb counting. Discussed with pt that A1C of 6.6 and need for carb counting and to follow up with his Medical doctor. Pt stated he has been drinking energy drinks. Discussed that he does not need the caffeine in these and sugar. Discussed CRP 2 and referred to [facility] program. Pt interested in Virtual program but will need assistance setting up. Pt is interested in participating in Virtual Cardiac and Pulmonary Rehab. Pt advised that Virtual Cardiac and Pulmonary Rehab is provided at no cost to the patient.  Checklist:  1. Pt has smart device  ie smartphone and/or ipad for downloading an app  Yes 2. Reliable internet/wifi service    Yes 3. Understands how to use their smartphone and navigate within an app.  Yes   Pt verbalized understanding and is in agreement.

## 2019-12-10 NOTE — Progress Notes (Signed)
Pt stated he " felt better" and  was not nauseated and BP 101/ 67, alert and oriented X 4. Pt transferred to Procedural short stay.

## 2019-12-10 NOTE — Interval H&P Note (Signed)
Cath Lab Visit (complete for each Cath Lab visit)  Clinical Evaluation Leading to the Procedure:   ACS: No.  Non-ACS:    Anginal Classification: CCS III  Anti-ischemic medical therapy: Minimal Therapy (1 class of medications)  Non-Invasive Test Results: High-risk stress test findings: cardiac mortality >3%/year LAD disease by CT scan  Prior CABG: No previous CABG      History and Physical Interval Note:  12/10/2019 10:52 AM  Peter Taylor  has presented today for surgery, with the diagnosis of Angina.  The various methods of treatment have been discussed with the patient and family. After consideration of risks, benefits and other options for treatment, the patient has consented to  Procedure(s): LEFT HEART CATH AND CORONARY ANGIOGRAPHY (N/A) as a surgical intervention.  The patient's history has been reviewed, patient examined, no change in status, stable for surgery.  I have reviewed the patient's chart and labs.  Questions were answered to the patient's satisfaction.     Larae Grooms

## 2019-12-10 NOTE — Progress Notes (Signed)
Per Caryl Pina NT, she saw 2 roaches on patient/ inside of the boot. I will contact [place] RN assistant Director to update.

## 2019-12-11 ENCOUNTER — Encounter: Payer: Self-pay | Admitting: Internal Medicine

## 2019-12-13 ENCOUNTER — Encounter (HOSPITAL_COMMUNITY): Payer: Self-pay | Admitting: *Deleted

## 2019-12-13 MED FILL — Tirofiban HCl in NaCl 0.9% IV Soln 5 MG/100ML (Base Equiv): INTRAVENOUS | Qty: 100 | Status: AC

## 2019-12-13 MED FILL — Heparin Sod (Porcine)-NaCl IV Soln 1000 Unit/500ML-0.9%: INTRAVENOUS | Qty: 500 | Status: AC

## 2019-12-13 NOTE — Progress Notes (Signed)
Referral received and verified for MD signature.  Follow up appt is 12/24/19. Insurance benefits and eligibility to be determined by support staff. Pt will be contacted for scheduling after satisfactorily completing his f/u on 2/26. Cherre Huger, BSN Cardiac and Training and development officer

## 2019-12-14 ENCOUNTER — Other Ambulatory Visit: Payer: Self-pay

## 2019-12-14 ENCOUNTER — Ambulatory Visit (HOSPITAL_COMMUNITY)
Admission: RE | Admit: 2019-12-14 | Discharge: 2019-12-14 | Disposition: A | Discharge status: Home / Self Care | Payer: Medicare HMO | Source: Ambulatory Visit | Attending: Internal Medicine | Admitting: Internal Medicine

## 2019-12-14 DIAGNOSIS — R1013 Epigastric pain: Secondary | ICD-10-CM | POA: Diagnosis not present

## 2019-12-14 DIAGNOSIS — R140 Abdominal distension (gaseous): Secondary | ICD-10-CM | POA: Insufficient documentation

## 2019-12-15 ENCOUNTER — Ambulatory Visit (INDEPENDENT_AMBULATORY_CARE_PROVIDER_SITE_OTHER): Payer: Medicare HMO | Admitting: Family Medicine

## 2019-12-15 ENCOUNTER — Encounter: Payer: Self-pay | Admitting: Family Medicine

## 2019-12-15 VITALS — BP 125/79 | HR 70 | Temp 98.0°F | Ht 71.0 in | Wt 190.0 lb

## 2019-12-15 DIAGNOSIS — I25810 Atherosclerosis of coronary artery bypass graft(s) without angina pectoris: Secondary | ICD-10-CM

## 2019-12-15 DIAGNOSIS — K219 Gastro-esophageal reflux disease without esophagitis: Secondary | ICD-10-CM | POA: Diagnosis not present

## 2019-12-15 DIAGNOSIS — E785 Hyperlipidemia, unspecified: Secondary | ICD-10-CM

## 2019-12-15 NOTE — Patient Instructions (Addendum)
Continue omeprazole, stop protonix (pantoprazole) for now. See foods to avoid below. If increased heartburn, then can take 2 omeprazole and call Dr. Henrene Pastor to discuss further.   No other changes for now. Recheck in 1 month - repeat labs then to decide if higher dose of lipitor needed.    Food Choices for Gastroesophageal Reflux Disease, Adult When you have gastroesophageal reflux disease (GERD), the foods you eat and your eating habits are very important. Choosing the right foods can help ease your discomfort. Think about working with a nutrition specialist (dietitian) to help you make good choices. What are tips for following this plan?  Meals  Choose healthy foods that are low in fat, such as fruits, vegetables, whole grains, low-fat dairy products, and lean meat, fish, and poultry.  Eat small meals often instead of 3 large meals a day. Eat your meals slowly, and in a place where you are relaxed. Avoid bending over or lying down until 2-3 hours after eating.  Avoid eating meals 2-3 hours before bed.  Avoid drinking a lot of liquid with meals.  Cook foods using methods other than frying. Bake, grill, or broil food instead.  Avoid or limit: ? Chocolate. ? Peppermint or spearmint. ? Alcohol. ? Pepper. ? Black and decaffeinated coffee. ? Black and decaffeinated tea. ? Bubbly (carbonated) soft drinks. ? Caffeinated energy drinks and soft drinks.  Limit high-fat foods such as: ? Fatty meat or fried foods. ? Whole milk, cream, butter, or ice cream. ? Nuts and nut butters. ? Pastries, donuts, and sweets made with butter or shortening.  Avoid foods that cause symptoms. These foods may be different for everyone. Common foods that cause symptoms include: ? Tomatoes. ? Oranges, lemons, and limes. ? Peppers. ? Spicy food. ? Onions and garlic. ? Vinegar. Lifestyle  Maintain a healthy weight. Ask your doctor what weight is healthy for you. If you need to lose weight, work with your  doctor to do so safely.  Exercise for at least 30 minutes for 5 or more days each week, or as told by your doctor.  Wear loose-fitting clothes.  Do not smoke. If you need help quitting, ask your doctor.  Sleep with the head of your bed higher than your feet. Use a wedge under the mattress or blocks under the bed frame to raise the head of the bed. Summary  When you have gastroesophageal reflux disease (GERD), food and lifestyle choices are very important in easing your symptoms.  Eat small meals often instead of 3 large meals a day. Eat your meals slowly, and in a place where you are relaxed.  Limit high-fat foods such as fatty meat or fried foods.  Avoid bending over or lying down until 2-3 hours after eating.  Avoid peppermint and spearmint, caffeine, alcohol, and chocolate. This information is not intended to replace advice given to you by your health care provider. Make sure you discuss any questions you have with your health care provider. Document Revised: 02/04/2019 Document Reviewed: 11/19/2016 Elsevier Patient Education  [employer].   If you have lab work done today you will be contacted with your lab results within the next 2 weeks.  If you have not heard from [place] then please contact us. The fastest way to get your results is to register for My Chart.   IF you received an x-ray today, you will receive an invoice from [facility]. Please contact [facility] at [phone] with questions or concerns regarding your invoice.  IF you received labwork today, you will receive an invoice from [facility]. Please contact [facility] at [phone] with questions or concerns regarding your invoice.   Our billing staff will not be able to assist you with questions regarding bills from these companies.  You will be contacted with the lab results as soon as they are available. The fastest way to get your results is to activate your My Chart account. Instructions  are located on the last page of this paperwork. If you have not heard from [place] regarding the results in 2 weeks, please contact this office.

## 2019-12-15 NOTE — Progress Notes (Signed)
Subjective:  Patient ID: BAYLOR KRITZER, male    DOB: [birth year]  Age: 69 y.o. MRN: [id number]  CC:  Chief Complaint  Patient presents with  . Follow-up    on abdominal pain 1 month, pt states no longer having the abdominal pain    HPI LEOTHA URTADO presents for   Coronary artery disease Left heart cath February 12.  Mid LAD 90% stenosis.  Drug-eluting stent was placed.  EF 55 to 65%.  Mild nonobstructive disease in the RCA and circumflex.  Dual antiplatelet therapy with aspirin and Plavix. Taking meds, no new bleeding, no new side effects. No rectal bleeding seen.   Hyperlipidemia: Lipitor 20 mg daily. No new myalgias/side effects.  Lab Results  Component Value Date   CHOL 235 (H) 11/10/2019   HDL 55 11/10/2019   LDLCALC 163 (H) 11/10/2019   TRIG 96 11/10/2019   CHOLHDL 4.3 11/10/2019   Lab Results  Component Value Date   ALT 31 11/26/2019   AST 21 11/26/2019   ALKPHOS 119 (H) 11/26/2019   BILITOT 0.4 11/26/2019   GERD Evaluated by Dr. Henrene Pastor January 28.  Some bloating symptoms, history of rectal bleeding as well.  Family history of colon cancer in brother at age 51.   endoscopy, colonoscopy on 2/9.   Normal endoscopy. Single polyp removed with colonoscopy. Diverticula and internal hemorrhoids noted.   Currently taking Protonix 40 mg daily and omeprazole - breakthough belching - improved with adding omeprazole. Feels like omeprazole worked better.    History Patient Active Problem List   Diagnosis Date Noted  . Coronary artery disease   . Hyperlipidemia 11/11/2019   Past Medical History:  Diagnosis Date  . CAD (coronary artery disease), native coronary artery    a. Cath 12/10/19: 90% mLAD s/p DES, mild non obstructive disease in RCA and Cx  . GERD (gastroesophageal reflux disease)   . Hyperlipidemia   . Substance abuse ([place])   . Tuberculosis    Past Surgical History:  Procedure Laterality Date  . CORONARY STENT INTERVENTION N/A 12/10/2019   Procedure:  CORONARY STENT INTERVENTION;  Surgeon: Jettie Booze, MD;  Location: [place] [facility];  Service: Cardiovascular;  Laterality: N/A;  . LEFT HEART CATH AND CORONARY ANGIOGRAPHY N/A 12/10/2019   Procedure: LEFT HEART CATH AND CORONARY ANGIOGRAPHY;  Surgeon: Jettie Booze, MD;  Location: [place] [facility];  Service: Cardiovascular;  Laterality: N/A;  . NO PAST SURGERIES     Allergies  Allergen Reactions  . Penicillins Hives    Did it involve swelling of the face/tongue/throat, SOB, or low BP? No Did it involve sudden or severe rash/hives, skin peeling, or any reaction on the inside of your mouth or nose? No Did you need to seek medical attention at a hospital or doctor's office? No When did it last happen?35+ years If all above answers are "NO", may proceed with cephalosporin use.    Prior to Admission medications   Medication Sig Start Date End Date Taking? Authorizing Provider  aspirin EC 81 MG tablet Take 1 tablet (81 mg total) by mouth daily. 11/11/19   Jettie Booze, MD  atorvastatin (LIPITOR) 20 MG tablet Take 1 tablet (20 mg total) by mouth daily. 11/11/19   Jettie Booze, MD  clopidogrel (PLAVIX) 75 MG tablet Take 1 tablet (75 mg total) by mouth daily with breakfast. 12/11/19   Bhagat, Crista Luria, PA  isosorbide mononitrate (IMDUR) 30 MG 24 hr tablet Take 1 tablet (30 mg total)  by mouth daily. Patient not taking: Reported on 12/07/2019 12/01/19   Jettie Booze, MD  metoprolol tartrate (LOPRESSOR) 25 MG tablet Take 1 tablet by mouth 2 hours prior to Cardiac CT Patient not taking: Reported on 12/06/2019 11/11/19   Jettie Booze, MD  nitroGLYCERIN (NITROSTAT) 0.4 MG SL tablet Place 1 tablet (0.4 mg total) under the tongue every 5 (five) minutes as needed for chest pain. 12/01/19   Jettie Booze, MD  pantoprazole (PROTONIX) 40 MG tablet Take 1 tablet (40 mg total) by mouth daily. 12/10/19 12/09/20  Leanor Kail, PA   Social History    Socioeconomic History  . Marital status: Single    Spouse name: Not on file  . Number of children: 8  . Years of education: Not on file  . Highest education level: Not on file  Occupational History  . Occupation: truck Geophysicist/field seismologist  Tobacco Use  . Smoking status: Former Research scientist (life sciences)  . Smokeless tobacco: Former Network engineer and Sexual Activity  . Alcohol use: Yes    Comment: occ  . Drug use: No  . Sexual activity: Not on file  Other Topics Concern  . Not on file  Social History Narrative  . Not on file   Social Determinants of Health   Financial Resource Strain:   . Difficulty of Paying Living Expenses: Not on file  Food Insecurity:   . Worried About Charity fundraiser in the Last Year: Not on file  . Ran Out of Food in the Last Year: Not on file  Transportation Needs:   . Lack of Transportation (Medical): Not on file  . Lack of Transportation (Non-Medical): Not on file  Physical Activity:   . Days of Exercise per Week: Not on file  . Minutes of Exercise per Session: Not on file  Stress:   . Feeling of Stress : Not on file  Social Connections:   . Frequency of Communication with Friends and Family: Not on file  . Frequency of Social Gatherings with Friends and Family: Not on file  . Attends Religious Services: Not on file  . Active Member of Clubs or Organizations: Not on file  . Attends Archivist Meetings: Not on file  . Marital Status: Not on file  Intimate Partner Violence:   . Fear of Current or Ex-Partner: Not on file  . Emotionally Abused: Not on file  . Physically Abused: Not on file  . Sexually Abused: Not on file    Review of Systems  Constitutional: Negative for fatigue and unexpected weight change.  Eyes: Negative for visual disturbance.  Respiratory: Negative for cough, chest tightness and shortness of breath.   Cardiovascular: Negative for chest pain, palpitations and leg swelling.  Gastrointestinal: Negative for abdominal pain, anal  bleeding, blood in stool and vomiting.  Neurological: Negative for dizziness, light-headedness and headaches.     Objective:   Vitals:   12/15/19 0847  BP: 125/79  Pulse: 70  Temp: 98 F (36.7 C)  TempSrc: Temporal  SpO2: 98%  Weight: 190 lb (86.2 kg)  Height: 5\' 11"  (1.803 m)    Physical Exam Vitals reviewed.  Constitutional:      Appearance: He is well-developed.  HENT:     Head: Normocephalic and atraumatic.  Eyes:     Pupils: Pupils are equal, round, and reactive to light.  Neck:     Vascular: No carotid bruit or JVD.  Cardiovascular:     Rate and Rhythm: Normal rate and regular  rhythm.     Heart sounds: Normal heart sounds. No murmur.  Pulmonary:     Effort: Pulmonary effort is normal.     Breath sounds: Normal breath sounds. No rales.  Skin:    General: Skin is warm and dry.  Neurological:     Mental Status: He is alert and oriented to person, place, and time.        Assessment & Plan:  AC GLADE is a 69 y.o. male . Coronary artery disease involving coronary bypass graft of native heart without angina pectoris  -Improved symptoms since stenting.  Feels well, tolerating dual antiplatelet therapy, no concerns at this time.  Continue same regimen.  Hyperlipidemia, unspecified hyperlipidemia type  -Tolerating Lipitor, continue same for now, recheck 1 month for repeat lipids, maximize statin at that time if needed.  Likely will need higher dose statin therapy.  Gastroesophageal reflux disease without esophagitis  -Stop Protonix as on dual PPIs at this time.  If omeprazole by itself is sufficient, continue once per day.  Up to twice per day if needed for breakthrough symptoms, then GI follow-up if that occurs.  Trigger foods given on handout again.   No orders of the defined types were placed in this encounter.  Patient Instructions   Continue omeprazole, stop protonix (pantoprazole) for now. See foods to avoid below. If increased heartburn, then can  take 2 omeprazole and call Dr. Henrene Pastor to discuss further.   No other changes for now. Recheck in 1 month - repeat labs then to decide if higher dose of lipitor needed.    Food Choices for Gastroesophageal Reflux Disease, Adult When you have gastroesophageal reflux disease (GERD), the foods you eat and your eating habits are very important. Choosing the right foods can help ease your discomfort. Think about working with a nutrition specialist (dietitian) to help you make good choices. What are tips for following this plan?  Meals  Choose healthy foods that are low in fat, such as fruits, vegetables, whole grains, low-fat dairy products, and lean meat, fish, and poultry.  Eat small meals often instead of 3 large meals a day. Eat your meals slowly, and in a place where you are relaxed. Avoid bending over or lying down until 2-3 hours after eating.  Avoid eating meals 2-3 hours before bed.  Avoid drinking a lot of liquid with meals.  Cook foods using methods other than frying. Bake, grill, or broil food instead.  Avoid or limit: ? Chocolate. ? Peppermint or spearmint. ? Alcohol. ? Pepper. ? Black and decaffeinated coffee. ? Black and decaffeinated tea. ? Bubbly (carbonated) soft drinks. ? Caffeinated energy drinks and soft drinks.  Limit high-fat foods such as: ? Fatty meat or fried foods. ? Whole milk, cream, butter, or ice cream. ? Nuts and nut butters. ? Pastries, donuts, and sweets made with butter or shortening.  Avoid foods that cause symptoms. These foods may be different for everyone. Common foods that cause symptoms include: ? Tomatoes. ? Oranges, lemons, and limes. ? Peppers. ? Spicy food. ? Onions and garlic. ? Vinegar. Lifestyle  Maintain a healthy weight. Ask your doctor what weight is healthy for you. If you need to lose weight, work with your doctor to do so safely.  Exercise for at least 30 minutes for 5 or more days each week, or as told by your  doctor.  Wear loose-fitting clothes.  Do not smoke. If you need help quitting, ask your doctor.  Sleep with the head of  your bed higher than your feet. Use a wedge under the mattress or blocks under the bed frame to raise the head of the bed. Summary  When you have gastroesophageal reflux disease (GERD), food and lifestyle choices are very important in easing your symptoms.  Eat small meals often instead of 3 large meals a day. Eat your meals slowly, and in a place where you are relaxed.  Limit high-fat foods such as fatty meat or fried foods.  Avoid bending over or lying down until 2-3 hours after eating.  Avoid peppermint and spearmint, caffeine, alcohol, and chocolate. This information is not intended to replace advice given to you by your health care provider. Make sure you discuss any questions you have with your health care provider. Document Revised: 02/04/2019 Document Reviewed: 11/19/2016 Elsevier Patient Education  [employer].   If you have lab work done today you will be contacted with your lab results within the next 2 weeks.  If you have not heard from [place] then please contact us. The fastest way to get your results is to register for My Chart.   IF you received an x-ray today, you will receive an invoice from [facility]. Please contact [facility] at [phone] with questions or concerns regarding your invoice.   IF you received labwork today, you will receive an invoice from [facility]. Please contact [facility] at [phone] with questions or concerns regarding your invoice.   Our billing staff will not be able to assist you with questions regarding bills from these companies.  You will be contacted with the lab results as soon as they are available. The fastest way to get your results is to activate your My Chart account. Instructions are located on the last page of this paperwork. If you have not heard from [place] regarding the results in 2  weeks, please contact this office.         Signed, Merri Ray, MD [facility]

## 2019-12-20 ENCOUNTER — Other Ambulatory Visit: Payer: Self-pay

## 2019-12-20 ENCOUNTER — Emergency Department (HOSPITAL_COMMUNITY)
Admission: EM | Admit: 2019-12-20 | Discharge: 2019-12-20 | Disposition: A | Discharge status: Home / Self Care | Payer: Medicare HMO | Attending: Emergency Medicine | Admitting: Emergency Medicine

## 2019-12-20 ENCOUNTER — Encounter (HOSPITAL_COMMUNITY): Payer: Self-pay | Admitting: Emergency Medicine

## 2019-12-20 DIAGNOSIS — Z7902 Long term (current) use of antithrombotics/antiplatelets: Secondary | ICD-10-CM | POA: Insufficient documentation

## 2019-12-20 DIAGNOSIS — I2510 Atherosclerotic heart disease of native coronary artery without angina pectoris: Secondary | ICD-10-CM | POA: Insufficient documentation

## 2019-12-20 DIAGNOSIS — N3000 Acute cystitis without hematuria: Secondary | ICD-10-CM | POA: Diagnosis not present

## 2019-12-20 DIAGNOSIS — Z79899 Other long term (current) drug therapy: Secondary | ICD-10-CM | POA: Insufficient documentation

## 2019-12-20 DIAGNOSIS — Z7982 Long term (current) use of aspirin: Secondary | ICD-10-CM | POA: Diagnosis not present

## 2019-12-20 DIAGNOSIS — Z87891 Personal history of nicotine dependence: Secondary | ICD-10-CM | POA: Diagnosis not present

## 2019-12-20 DIAGNOSIS — R300 Dysuria: Secondary | ICD-10-CM | POA: Diagnosis present

## 2019-12-20 LAB — URINALYSIS, ROUTINE W REFLEX MICROSCOPIC
Bilirubin Urine: NEGATIVE
Glucose, UA: NEGATIVE mg/dL
Ketones, ur: 20 mg/dL — AB
Nitrite: NEGATIVE
Protein, ur: 100 mg/dL — AB
Specific Gravity, Urine: 1.028 (ref 1.005–1.030)
WBC, UA: >50 WBC/hpf — ABNORMAL HIGH (ref 0–5)
pH: 5 (ref 5.0–8.0)

## 2019-12-20 LAB — CBC
HCT: 42.1 % (ref 39.0–52.0)
Hemoglobin: 13.6 g/dL (ref 13.0–17.0)
MCH: 28.8 pg (ref 26.0–34.0)
MCHC: 32.3 g/dL (ref 30.0–36.0)
MCV: 89 fL (ref 80.0–100.0)
Platelets: 286 10*3/uL (ref 150–400)
RBC: 4.73 MIL/uL (ref 4.22–5.81)
RDW: 13.7 % (ref 11.5–15.5)
WBC: 7.2 10*3/uL (ref 4.0–10.5)
nRBC: 0 % (ref 0.0–0.2)

## 2019-12-20 LAB — BASIC METABOLIC PANEL
Anion gap: 16 — ABNORMAL HIGH (ref 5–15)
BUN: 19 mg/dL (ref 8–23)
CO2: 24 mmol/L (ref 22–32)
Calcium: 9.2 mg/dL (ref 8.9–10.3)
Chloride: 96 mmol/L — ABNORMAL LOW (ref 98–111)
Creatinine, Ser: 1.57 mg/dL — ABNORMAL HIGH (ref 0.61–1.24)
GFR calc Af Amer: 52 mL/min — ABNORMAL LOW (ref >60)
GFR calc non Af Amer: 45 mL/min — ABNORMAL LOW (ref >60)
Glucose, Bld: 93 mg/dL (ref 70–99)
Potassium: 3.5 mmol/L (ref 3.5–5.1)
Sodium: 136 mmol/L (ref 135–145)

## 2019-12-20 MED ORDER — SODIUM CHLORIDE 0.9 % IV SOLN
1.0000 g | Freq: Once | INTRAVENOUS | Status: AC
  Administered 2019-12-20: 1 g via INTRAVENOUS
  Filled 2019-12-20: qty 10

## 2019-12-20 MED ORDER — PHENAZOPYRIDINE HCL 200 MG PO TABS: 200.0000 mg | ORAL_TABLET | Freq: Three times a day (TID) | ORAL | 0 refills | Status: DC

## 2019-12-20 MED ORDER — SODIUM CHLORIDE 0.9 % IV BOLUS: 1000.0000 mL | Freq: Once | INTRAVENOUS | Status: DC

## 2019-12-20 MED ORDER — PHENAZOPYRIDINE HCL 100 MG PO TABS
200.0000 mg | ORAL_TABLET | Freq: Once | ORAL | Status: AC
  Administered 2019-12-20: 200 mg via ORAL
  Filled 2019-12-20: qty 2

## 2019-12-20 MED ORDER — CEPHALEXIN 500 MG PO CAPS: 500.0000 mg | ORAL_CAPSULE | Freq: Two times a day (BID) | ORAL | 0 refills | Status: DC

## 2019-12-20 NOTE — Discharge Instructions (Signed)
Please read and follow all provided instructions.  Your diagnoses today include:  1. Acute cystitis without hematuria     Tests performed today include:  Urine test - suggests that you have an infection in your bladder  Urine culture  Kidney function test - slightly high, please have your doctor recheck this  Vital signs. See below for your results today.   Medications prescribed:   Keflex (cephalexin) - antibiotic  You have been prescribed an antibiotic medicine: take the entire course of medicine even if you are feeling better. Stopping early can cause the antibiotic not to work.   Pyridium - medication for urinary tract infection symptoms.   This medication will turn your urine orange. This is normal.   Home care instructions:  Follow any educational materials contained in this packet.  Follow-up instructions: Please follow-up with your primary care provider in 3 days if symptoms are not resolved for further evaluation of your symptoms.  Return instructions:   Please return to the Emergency Department if you experience worsening symptoms.   Return with fever, worsening pain, persistent vomiting, worsening pain in your back.   Please return if you have any other emergent concerns.  Additional Information:  Your vital signs today were: BP 121/73 (BP Location: Right Arm)   Pulse (!) 57   Temp (!) 97.5 F (36.4 C) (Oral)   Resp 16   Wt 86.2 kg   SpO2 98%   BMI 26.50 kg/m  If your blood pressure (BP) was elevated above 135/85 this visit, please have this repeated by your doctor within one month. --------------

## 2019-12-20 NOTE — ED Triage Notes (Signed)
Pt in from home w/dysuria and difficulty emptying bladder x few days. States he had heart cath done 1.5 wks ago, had foley placed. Feels pressure in low abdomen, states he has had chills at night, denies any flank pain

## 2019-12-20 NOTE — ED Provider Notes (Signed)
[facility] EMERGENCY DEPARTMENT Provider Note   CSN: [id number] Arrival date & time: 12/20/19  1118     History Chief Complaint  Patient presents with  . Urinary Retention  . Dysuria    Peter Taylor is a 69 y.o. male.  Patient status post cardiac catheterization 12/10/2019 presents to the emergency department with complaint of difficult urination over the past day.  Patient has some pain with urination and pressure in the lower abdomen.  He denies any fevers, nausea or vomiting.  Denies antihistamine use.  States that he has a history of "prostate problem" but cannot tell me exactly what it is.  States he has had his PSA checked in the past.  No history of prostate surgery.  The onset of this condition was acute. The course is constant. Aggravating factors: none. Alleviating factors: none.          Past Medical History:  Diagnosis Date  . CAD (coronary artery disease), native coronary artery    a. Cath 12/10/19: 90% mLAD s/p DES, mild non obstructive disease in RCA and Cx  . GERD (gastroesophageal reflux disease)   . Hyperlipidemia   . Substance abuse ([place])   . Tuberculosis     Patient Active Problem List   Diagnosis Date Noted  . Coronary artery disease   . Hyperlipidemia 11/11/2019    Past Surgical History:  Procedure Laterality Date  . CORONARY STENT INTERVENTION N/A 12/10/2019   Procedure: CORONARY STENT INTERVENTION;  Surgeon: Jettie Booze, MD;  Location: [place] [facility];  Service: Cardiovascular;  Laterality: N/A;  . LEFT HEART CATH AND CORONARY ANGIOGRAPHY N/A 12/10/2019   Procedure: LEFT HEART CATH AND CORONARY ANGIOGRAPHY;  Surgeon: Jettie Booze, MD;  Location: [place] [facility];  Service: Cardiovascular;  Laterality: N/A;  . NO PAST SURGERIES         Family History  Problem Relation Age of Onset  . Cancer Mother        unsure type  . Cancer Brother        pancreatic  . Ovarian cancer Sister   . Ovarian cancer  Sister   . Ovarian cancer Sister     Social History   Tobacco Use  . Smoking status: Former Research scientist (life sciences)  . Smokeless tobacco: Former Network engineer Use Topics  . Alcohol use: Yes    Comment: occ  . Drug use: No    Home Medications Prior to Admission medications   Medication Sig Start Date End Date Taking? Authorizing Provider  aspirin EC 81 MG tablet Take 1 tablet (81 mg total) by mouth daily. 11/11/19   Jettie Booze, MD  atorvastatin (LIPITOR) 20 MG tablet Take 1 tablet (20 mg total) by mouth daily. 11/11/19   Jettie Booze, MD  clopidogrel (PLAVIX) 75 MG tablet Take 1 tablet (75 mg total) by mouth daily with breakfast. 12/11/19   Bhagat, Crista Luria, PA  isosorbide mononitrate (IMDUR) 30 MG 24 hr tablet Take 1 tablet (30 mg total) by mouth daily. 12/01/19   Jettie Booze, MD  metoprolol tartrate (LOPRESSOR) 25 MG tablet Take 1 tablet by mouth 2 hours prior to Cardiac CT 11/11/19   Jettie Booze, MD  nitroGLYCERIN (NITROSTAT) 0.4 MG SL tablet Place 1 tablet (0.4 mg total) under the tongue every 5 (five) minutes as needed for chest pain. 12/01/19   Jettie Booze, MD  pantoprazole (PROTONIX) 40 MG tablet Take 1 tablet (40 mg total) by mouth daily. 12/10/19  12/09/20  Leanor Kail, PA    Allergies    Penicillins  Review of Systems   Review of Systems  Constitutional: Negative for fever.  HENT: Negative for rhinorrhea and sore throat.   Eyes: Negative for redness.  Respiratory: Negative for cough.   Cardiovascular: Negative for chest pain.  Gastrointestinal: Positive for abdominal pain. Negative for diarrhea, nausea and vomiting.  Genitourinary: Positive for difficulty urinating and dysuria. Negative for flank pain.  Musculoskeletal: Negative for myalgias.  Skin: Negative for rash.  Neurological: Negative for headaches.    Physical Exam Updated Vital Signs BP 123/76 (BP Location: Right Arm)   Pulse 71   Temp (!) 97.5 F (36.4 C) (Oral)   Resp  18   Wt 86.2 kg   SpO2 100%   BMI 26.50 kg/m   Physical Exam Vitals and nursing note reviewed.  Constitutional:      Appearance: He is well-developed.  HENT:     Head: Normocephalic and atraumatic.  Eyes:     General:        Right eye: No discharge.        Left eye: No discharge.     Conjunctiva/sclera: Conjunctivae normal.  Cardiovascular:     Rate and Rhythm: Normal rate and regular rhythm.     Heart sounds: Normal heart sounds.  Pulmonary:     Effort: Pulmonary effort is normal.     Breath sounds: Normal breath sounds.  Abdominal:     Palpations: Abdomen is soft.     Tenderness: There is abdominal tenderness. There is no guarding or rebound.     Comments: Suprapubic tenderness  Musculoskeletal:     Cervical back: Normal range of motion and neck supple.  Skin:    General: Skin is warm and dry.  Neurological:     Mental Status: He is alert.     ED Results / Procedures / Treatments   Labs (all labs ordered are listed, but only abnormal results are displayed) Labs Reviewed  URINALYSIS, ROUTINE W REFLEX MICROSCOPIC - Abnormal; Notable for the following components:      Result Value   Color, Urine AMBER (*)    APPearance CLOUDY (*)    Hgb urine dipstick LARGE (*)    Ketones, ur 20 (*)    Protein, ur 100 (*)    Leukocytes,Ua MODERATE (*)    WBC, UA >50 (*)    Bacteria, UA RARE (*)    Non Squamous Epithelial 0-5 (*)    All other components within normal limits  BASIC METABOLIC PANEL - Abnormal; Notable for the following components:   Chloride 96 (*)    Creatinine, Ser 1.57 (*)    GFR calc non Af Amer 45 (*)    GFR calc Af Amer 52 (*)    Anion gap 16 (*)    All other components within normal limits  URINE CULTURE  CBC    EKG None  Radiology No results found.  Procedures Procedures (including critical care time)  Medications Ordered in ED Medications  phenazopyridine (PYRIDIUM) tablet 200 mg (200 mg Oral Given 12/20/19 1332)  cefTRIAXone (ROCEPHIN) 1  g in sodium chloride 0.9 % 100 mL IVPB (0 g Intravenous Stopped 12/20/19 1421)    ED Course  I have reviewed the triage vital signs and the nursing notes.  Pertinent labs & imaging results that were available during my care of the patient were reviewed by me and considered in my medical decision making (see chart for details).  Patient  seen and examined. Work-up initiated. Bladder scan/foley ordered.   Vital signs reviewed and are as follows: BP 123/76 (BP Location: Right Arm)   Pulse 71   Temp (!) 97.5 F (36.4 C) (Oral)   Resp 18   Wt 86.2 kg   SpO2 100%   BMI 26.50 kg/m   12:56 PM Bladder scan was 117 at max. NT is sending UA now. Will give azo. Hold foley for now.   2:56 PM UA concerning for UTI.  Patient was treated with 1 g of Rocephin in the ED.  Culture sent.  Patient discussed with and seen by Dr. Ralene Bathe.  Plan for discharge home.  Patient encouraged to have his creatinine rechecked given mild elevation  Will DC with Keflex.    MDM Rules/Calculators/A&P                      Patient presents with dysuria and difficulty with urination.  Initially concern for urinary retention however low volume on bladder scan normal and I think patient has more UTI irritative symptoms causing the sensation of retention.  Patient has been able to urinate here.  Will treat for UTI and have him follow-up.  No evidence of pyelonephritis with lack of fever and vomiting.  Patient does not have any other significant abdominal pain other than some discomfort in the suprapubic area that is consistent with cystitis.   Final Clinical Impression(s) / ED Diagnoses Final diagnoses:  Acute cystitis without hematuria    Rx / DC Orders ED Discharge Orders         Ordered    cephALEXin (KEFLEX) 500 MG capsule  2 times daily     12/20/19 1433    phenazopyridine (PYRIDIUM) 200 MG tablet  3 times daily     12/20/19 1433           Carlisle Cater, PA-C 12/20/19 1459    Quintella Reichert,  MD 12/21/19 2230

## 2019-12-22 ENCOUNTER — Encounter (HOSPITAL_COMMUNITY): Payer: Self-pay

## 2019-12-22 ENCOUNTER — Emergency Department (HOSPITAL_COMMUNITY)
Admission: EM | Admit: 2019-12-22 | Discharge: 2019-12-22 | Disposition: A | Discharge status: Home / Self Care | Payer: Medicare HMO | Attending: Emergency Medicine | Admitting: Emergency Medicine

## 2019-12-22 ENCOUNTER — Other Ambulatory Visit: Payer: Self-pay

## 2019-12-22 DIAGNOSIS — N390 Urinary tract infection, site not specified: Secondary | ICD-10-CM | POA: Diagnosis not present

## 2019-12-22 DIAGNOSIS — R339 Retention of urine, unspecified: Secondary | ICD-10-CM | POA: Diagnosis present

## 2019-12-22 DIAGNOSIS — Z9861 Coronary angioplasty status: Secondary | ICD-10-CM | POA: Insufficient documentation

## 2019-12-22 DIAGNOSIS — R945 Abnormal results of liver function studies: Secondary | ICD-10-CM

## 2019-12-22 DIAGNOSIS — R7989 Other specified abnormal findings of blood chemistry: Secondary | ICD-10-CM

## 2019-12-22 DIAGNOSIS — I2510 Atherosclerotic heart disease of native coronary artery without angina pectoris: Secondary | ICD-10-CM | POA: Insufficient documentation

## 2019-12-22 DIAGNOSIS — Z79899 Other long term (current) drug therapy: Secondary | ICD-10-CM | POA: Diagnosis not present

## 2019-12-22 DIAGNOSIS — R300 Dysuria: Secondary | ICD-10-CM | POA: Diagnosis not present

## 2019-12-22 DIAGNOSIS — Z7982 Long term (current) use of aspirin: Secondary | ICD-10-CM | POA: Diagnosis not present

## 2019-12-22 LAB — CBC WITH DIFFERENTIAL/PLATELET
Abs Immature Granulocytes: 0.04 10*3/uL (ref 0.00–0.07)
Basophils Absolute: 0.1 10*3/uL (ref 0.0–0.1)
Basophils Relative: 1 %
Eosinophils Absolute: 0.3 10*3/uL (ref 0.0–0.5)
Eosinophils Relative: 3 %
HCT: 42.4 % (ref 39.0–52.0)
Hemoglobin: 13.7 g/dL (ref 13.0–17.0)
Immature Granulocytes: 1 %
Lymphocytes Relative: 31 %
Lymphs Abs: 2.3 10*3/uL (ref 0.7–4.0)
MCH: 28.5 pg (ref 26.0–34.0)
MCHC: 32.3 g/dL (ref 30.0–36.0)
MCV: 88.1 fL (ref 80.0–100.0)
Monocytes Absolute: 0.5 10*3/uL (ref 0.1–1.0)
Monocytes Relative: 7 %
Neutro Abs: 4.2 10*3/uL (ref 1.7–7.7)
Neutrophils Relative %: 57 %
Platelets: 349 10*3/uL (ref 150–400)
RBC: 4.81 MIL/uL (ref 4.22–5.81)
RDW: 13.5 % (ref 11.5–15.5)
WBC: 7.4 10*3/uL (ref 4.0–10.5)
nRBC: 0 % (ref 0.0–0.2)

## 2019-12-22 LAB — URINALYSIS, ROUTINE W REFLEX MICROSCOPIC
Bilirubin Urine: NEGATIVE
Glucose, UA: NEGATIVE mg/dL
Hgb urine dipstick: NEGATIVE
Ketones, ur: NEGATIVE mg/dL
Nitrite: NEGATIVE
Protein, ur: NEGATIVE mg/dL
Specific Gravity, Urine: 1.006 (ref 1.005–1.030)
pH: 7 (ref 5.0–8.0)

## 2019-12-22 LAB — URINE CULTURE: Culture: >=100000 — AB

## 2019-12-22 LAB — COMPREHENSIVE METABOLIC PANEL
ALT: 72 U/L — ABNORMAL HIGH (ref 0–44)
AST: 45 U/L — ABNORMAL HIGH (ref 15–41)
Albumin: 3.5 g/dL (ref 3.5–5.0)
Alkaline Phosphatase: 157 U/L — ABNORMAL HIGH (ref 38–126)
Anion gap: 10 (ref 5–15)
BUN: 12 mg/dL (ref 8–23)
CO2: 28 mmol/L (ref 22–32)
Calcium: 9.4 mg/dL (ref 8.9–10.3)
Chloride: 102 mmol/L (ref 98–111)
Creatinine, Ser: 1.19 mg/dL (ref 0.61–1.24)
GFR calc Af Amer: >60 mL/min (ref >60)
GFR calc non Af Amer: >60 mL/min (ref >60)
Glucose, Bld: 117 mg/dL — ABNORMAL HIGH (ref 70–99)
Potassium: 3.6 mmol/L (ref 3.5–5.1)
Sodium: 140 mmol/L (ref 135–145)
Total Bilirubin: 0.4 mg/dL (ref 0.3–1.2)
Total Protein: 7.5 g/dL (ref 6.5–8.1)

## 2019-12-22 LAB — LACTIC ACID, PLASMA: Lactic Acid, Venous: 1.5 mmol/L (ref 0.5–1.9)

## 2019-12-22 MED ORDER — SULFAMETHOXAZOLE-TRIMETHOPRIM 800-160 MG PO TABS: 1.0000 | ORAL_TABLET | Freq: Two times a day (BID) | ORAL | 0 refills | Status: AC

## 2019-12-22 MED ORDER — TAMSULOSIN HCL 0.4 MG PO CAPS: 0.4000 mg | ORAL_CAPSULE | Freq: Every day | ORAL | 0 refills | Status: DC

## 2019-12-22 MED ORDER — SODIUM CHLORIDE 0.9 % IV SOLN
1.0000 g | Freq: Once | INTRAVENOUS | Status: AC
  Administered 2019-12-22: 1 g via INTRAVENOUS
  Filled 2019-12-22: qty 10

## 2019-12-22 NOTE — ED Notes (Signed)
Bladder scan 375 mL

## 2019-12-22 NOTE — ED Notes (Signed)
Bladder scan 33ML

## 2019-12-22 NOTE — Discharge Instructions (Signed)
Your antibiotic is being switched to Bactrim.  Stop the cephalexin.  Your liver function tests are little abnormal today.  You need this rechecked in about a week.  Call your doctor to get this rechecked.  Do not take Tylenol or drink alcohol or other medicines that could affect your liver.  If you develop fever, vomiting, inability urinate, or any other new/concerning symptoms then return to the ER for evaluation.

## 2019-12-22 NOTE — ED Provider Notes (Signed)
[facility] DEPARTMENT Provider Note   CSN: [id number] Arrival date & time: 12/22/19  1135     History Chief Complaint  Patient presents with  . Abdominal Pain    Peter Taylor is a 69 y.o. male.  HPI 69 year old male presents with urinary discomfort.  He was seen here a couple days ago and treated with Rocephin and Keflex for UTI.  At first he had difficulty voiding but then over the last 2 days has been fine.  However when he woke up in the middle the night this morning, he could not urinate.  Tried again later and was finally able to urinate and then had frequency with a lot of burning.  This has improved significantly and now he is voiding freely.  When he first checked in, his temperature was 102.9.  He has not had any fever.  It was checked just a few minutes later and was normal.  He thinks his first check was incorrectly done and does not believe the result.  Otherwise no other symptoms such as nausea, vomiting, flank pain. Has been taking the po antibiotics for the past couple days.    Past Medical History:  Diagnosis Date  . CAD (coronary artery disease), native coronary artery    a. Cath 12/10/19: 90% mLAD s/p DES, mild non obstructive disease in RCA and Cx  . GERD (gastroesophageal reflux disease)   . Hyperlipidemia   . Substance abuse ([place])   . Tuberculosis     Patient Active Problem List   Diagnosis Date Noted  . Coronary artery disease   . Hyperlipidemia 11/11/2019    Past Surgical History:  Procedure Laterality Date  . CORONARY STENT INTERVENTION N/A 12/10/2019   Procedure: CORONARY STENT INTERVENTION;  Surgeon: Jettie Booze, MD;  Location: [facility];  Service: Cardiovascular;  Laterality: N/A;  . LEFT HEART CATH AND CORONARY ANGIOGRAPHY N/A 12/10/2019   Procedure: LEFT HEART CATH AND CORONARY ANGIOGRAPHY;  Surgeon: Jettie Booze, MD;  Location: [facility];  Service: Cardiovascular;  Laterality: N/A;    . NO PAST SURGERIES         Family History  Problem Relation Age of Onset  . Cancer Mother        unsure type  . Cancer Brother        pancreatic  . Ovarian cancer Sister   . Ovarian cancer Sister   . Ovarian cancer Sister     Social History   Tobacco Use  . Smoking status: Former Research scientist (life sciences)  . Smokeless tobacco: Former Network engineer Use Topics  . Alcohol use: Yes    Comment: occ  . Drug use: No    Home Medications Prior to Admission medications   Medication Sig Start Date End Date Taking? Authorizing Provider  aspirin EC 81 MG tablet Take 1 tablet (81 mg total) by mouth daily. 11/11/19   Jettie Booze, MD  atorvastatin (LIPITOR) 20 MG tablet Take 1 tablet (20 mg total) by mouth daily. 11/11/19   Jettie Booze, MD  clopidogrel (PLAVIX) 75 MG tablet Take 1 tablet (75 mg total) by mouth daily with breakfast. 12/11/19   Bhagat, Crista Luria, PA  isosorbide mononitrate (IMDUR) 30 MG 24 hr tablet Take 1 tablet (30 mg total) by mouth daily. 12/01/19   Jettie Booze, MD  metoprolol tartrate (LOPRESSOR) 25 MG tablet Take 1 tablet by mouth 2 hours prior to Cardiac CT 11/11/19   Jettie Booze, MD  nitroGLYCERIN (NITROSTAT) 0.4 MG SL tablet Place 1 tablet (0.4 mg total) under the tongue every 5 (five) minutes as needed for chest pain. 12/01/19   Jettie Booze, MD  pantoprazole (PROTONIX) 40 MG tablet Take 1 tablet (40 mg total) by mouth daily. 12/10/19 12/09/20  Leanor Kail, PA  phenazopyridine (PYRIDIUM) 200 MG tablet Take 1 tablet (200 mg total) by mouth 3 (three) times daily. 12/20/19   Carlisle Cater, PA-C  sulfamethoxazole-trimethoprim (BACTRIM DS) 800-160 MG tablet Take 1 tablet by mouth 2 (two) times daily for 7 days. 12/22/19 12/29/19  Sherwood Gambler, MD  tamsulosin (FLOMAX) 0.4 MG CAPS capsule Take 1 capsule (0.4 mg total) by mouth daily. 12/22/19   Sherwood Gambler, MD    Allergies    Penicillins  Review of Systems   Review of Systems   Constitutional: Negative for fever.  Gastrointestinal: Negative for abdominal pain, diarrhea and vomiting.  Genitourinary: Positive for difficulty urinating and dysuria. Negative for flank pain and hematuria.  Musculoskeletal: Negative for back pain.  All other systems reviewed and are negative.   Physical Exam Updated Vital Signs BP (!) 149/86 (BP Location: Right Arm)   Pulse 62   Temp 97.8 F (36.6 C)   Resp 17   Ht 5\' 11"  (1.803 m)   Wt 86 kg   SpO2 99%   BMI 26.44 kg/m   Physical Exam Vitals and nursing note reviewed.  Constitutional:      General: He is not in acute distress.    Appearance: He is well-developed. He is not ill-appearing or diaphoretic.  HENT:     Head: Normocephalic and atraumatic.     Right Ear: External ear normal.     Left Ear: External ear normal.     Nose: Nose normal.  Eyes:     General:        Right eye: No discharge.        Left eye: No discharge.  Cardiovascular:     Rate and Rhythm: Normal rate and regular rhythm.     Heart sounds: Normal heart sounds.  Pulmonary:     Effort: Pulmonary effort is normal.     Breath sounds: Normal breath sounds.  Abdominal:     Palpations: Abdomen is soft.     Tenderness: There is no abdominal tenderness. There is no right CVA tenderness or left CVA tenderness.  Musculoskeletal:     Cervical back: Neck supple.  Skin:    General: Skin is warm and dry.  Neurological:     Mental Status: He is alert.  Psychiatric:        Mood and Affect: Mood is not anxious.     ED Results / Procedures / Treatments   Labs (all labs ordered are listed, but only abnormal results are displayed) Labs Reviewed  URINALYSIS, ROUTINE W REFLEX MICROSCOPIC - Abnormal; Notable for the following components:      Result Value   Color, Urine STRAW (*)    Leukocytes,Ua TRACE (*)    Bacteria, UA RARE (*)    All other components within normal limits  COMPREHENSIVE METABOLIC PANEL - Abnormal; Notable for the following  components:   Glucose, Bld 117 (*)    AST 45 (*)    ALT 72 (*)    Alkaline Phosphatase 157 (*)    All other components within normal limits  LACTIC ACID, PLASMA  CBC WITH DIFFERENTIAL/PLATELET    EKG None  Radiology No results found.  Procedures Procedures (including critical care time)  Medications Ordered in ED Medications  cefTRIAXone (ROCEPHIN) 1 g in sodium chloride 0.9 % 100 mL IVPB (0 g Intravenous Stopped 12/22/19 1708)    ED Course  I have reviewed the triage vital signs and the nursing notes.  Pertinent labs & imaging results that were available during my care of the patient were reviewed by me and considered in my medical decision making (see chart for details).    MDM Rules/Calculators/A&P                      Patient presents with continued urinary complaints.  Sounds like he had urinary retention but was finally able to pass urine.  Initially had bladder scan with around 300 mL.  However he was able to urinate and repeat showed only 33.  I will place him on Keflex but at this point he does not appear to need an emergent catheterization.  His urine culture from 2 days ago shows E. coli.  Given he feels like there is no improvement despite what should be sensitive medication, I will change him to Bactrim.  He was also given dose of Rocephin here.  His labs are unremarkable.  There was the questionable fever but then all a few minutes later his temperature was normal.  I suspect he has not truly had a fever.  Does not appear to have signs of sepsis.  He appears stable for outpatient follow-up.  He was noted to have some mild LFT abnormalities though no right upper quadrant or abdominal pain.  Will need follow-up. Final Clinical Impression(s) / ED Diagnoses Final diagnoses:  Acute urinary tract infection  Abnormal LFTs    Rx / DC Orders ED Discharge Orders         Ordered    sulfamethoxazole-trimethoprim (BACTRIM DS) 800-160 MG tablet  2 times daily     12/22/19  1725    tamsulosin (FLOMAX) 0.4 MG CAPS capsule  Daily     12/22/19 1725           Sherwood Gambler, MD 12/22/19 1810

## 2019-12-22 NOTE — ED Triage Notes (Signed)
Pt seen here Monday and diagnosed with a bladder infection, pt returns today due to ongoing pain. Pt taking prescribed medications but still having pain, denies n/v.

## 2019-12-23 ENCOUNTER — Telehealth: Payer: Self-pay

## 2019-12-23 ENCOUNTER — Telehealth (HOSPITAL_COMMUNITY): Payer: Self-pay

## 2019-12-23 ENCOUNTER — Other Ambulatory Visit (HOSPITAL_COMMUNITY): Payer: Medicare HMO

## 2019-12-23 ENCOUNTER — Telehealth: Payer: Self-pay | Admitting: *Deleted

## 2019-12-23 NOTE — Telephone Encounter (Signed)
Pt insurance is active and benefits verified through [place] $10, DED 0/0 met, out of pocket $3,900/$288.46 met, co-insurance 0%. no pre-authorization required. Passport, 12/23/2019@11 :35am, REF# [id number]  Will contact patient to see if he is interested in the Cardiac Rehab Program. If interested, patient will need to complete follow up appt. Once completed, patient will be contacted for scheduling upon review by the RN Navigator.

## 2019-12-23 NOTE — Telephone Encounter (Signed)
Post ED Visit - Positive Culture Follow-up  Culture report reviewed by antimicrobial stewardship pharmacist: [facility] Team []  Elenor Quinones, Pharm.D. []  Heide Guile, Pharm.D., BCPS AQ-ID []  Parks Neptune, Pharm.D., BCPS []  Alycia Rossetti, Pharm.D., BCPS []  [place], Pharm.D., BCPS, AAHIVP []  Legrand Como, Pharm.D., BCPS, AAHIVP []  Salome Arnt, PharmD, BCPS []  Johnnette Gourd, PharmD, BCPS []  Hughes Better, PharmD, BCPS []  Leeroy Cha, PharmD []  Laqueta Linden, PharmD, BCPS []  Albertina Parr, PharmD [facility] Team []  Leodis Sias, PharmD []  Lindell Spar, PharmD []  Royetta Asal, PharmD []  Graylin Shiver, Rph []  Rema Fendt) Glennon Mac, PharmD []  Arlyn Dunning, PharmD []  Netta Cedars, PharmD []  Dia Sitter, PharmD []  Leone Haven, PharmD []  Gretta Arab, PharmD []  Theodis Shove, PharmD []  Peggyann Juba, PharmD []  Reuel Boom, PharmD   Positive urine culture Treated with Cephalexin, organism sensitive to the same and no further patient follow-up is required at this time.  Genia Del 12/23/2019, 10:01 AM

## 2019-12-23 NOTE — Progress Notes (Signed)
Cardiology Office Note   Date:  12/24/2019   ID:  DOUGALS DOLPH, DOB [birth year], MRN [id number]  PCP:  Wendie Agreste, MD    No chief complaint on file.  CAD  Wt Readings from Last 3 Encounters:  12/24/19 189 lb 1.9 oz (85.8 kg)  12/22/19 189 lb 9.5 oz (86 kg)  12/20/19 190 lb 0.6 oz (86.2 kg)       History of Present Illness: KATIE SCHOWALTER is a 69 y.o. male  With CAD.  Cath in 11/2019 showed:  "Mid LAD lesion is 90% stenosed.  A drug-eluting stent was successfully placed using a STENT RESOLUTE ONYX 3.0X22.  Post intervention, there is a 0% residual stenosis.  The left ventricular systolic function is normal.  LV end diastolic pressure is normal.  The left ventricular ejection fraction is 55-65% by visual estimate.  There is no aortic valve stenosis.  Mild, nonobstructive disease in the RCA and circumflex.   Continue dual antiplatelet therapy along with aggressive secondary prevention. "  Since the last visit, he has felt well.    Denies : Chest pain. Dizziness. Leg edema. Nitroglycerin use. Orthopnea. Palpitations. Paroxysmal nocturnal dyspnea. Shortness of breath. Syncope.   Wrist site healed well.  He has returned to work as a Administrator.     Past Medical History:  Diagnosis Date  . CAD (coronary artery disease), native coronary artery    a. Cath 12/10/19: 90% mLAD s/p DES, mild non obstructive disease in RCA and Cx  . GERD (gastroesophageal reflux disease)   . Hyperlipidemia   . Substance abuse ([place])   . Tuberculosis     Past Surgical History:  Procedure Laterality Date  . CORONARY STENT INTERVENTION N/A 12/10/2019   Procedure: CORONARY STENT INTERVENTION;  Surgeon: Jettie Booze, MD;  Location: [facility];  Service: Cardiovascular;  Laterality: N/A;  . LEFT HEART CATH AND CORONARY ANGIOGRAPHY N/A 12/10/2019   Procedure: LEFT HEART CATH AND CORONARY ANGIOGRAPHY;  Surgeon: Jettie Booze, MD;  Location: [facility];  Service: Cardiovascular;  Laterality: N/A;  . NO PAST SURGERIES       Current Outpatient Medications  Medication Sig Dispense Refill  . aspirin EC 81 MG tablet Take 1 tablet (81 mg total) by mouth daily. 90 tablet 3  . atorvastatin (LIPITOR) 20 MG tablet Take 1 tablet (20 mg total) by mouth daily. 90 tablet 3  . clopidogrel (PLAVIX) 75 MG tablet Take 1 tablet (75 mg total) by mouth daily with breakfast. 30 tablet 11  . isosorbide mononitrate (IMDUR) 30 MG 24 hr tablet Take 1 tablet (30 mg total) by mouth daily. 90 tablet 3  . nitroGLYCERIN (NITROSTAT) 0.4 MG SL tablet Place 1 tablet (0.4 mg total) under the tongue every 5 (five) minutes as needed for chest pain. 25 tablet 3  . pantoprazole (PROTONIX) 40 MG tablet Take 1 tablet (40 mg total) by mouth daily. 30 tablet 6  . phenazopyridine (PYRIDIUM) 200 MG tablet Take 1 tablet (200 mg total) by mouth 3 (three) times daily. 6 tablet 0  . sulfamethoxazole-trimethoprim (BACTRIM DS) 800-160 MG tablet Take 1 tablet by mouth 2 (two) times daily for 7 days. 14 tablet 0  . tamsulosin (FLOMAX) 0.4 MG CAPS capsule Take 1 capsule (0.4 mg total) by mouth daily. 30 capsule 0   No current facility-administered medications for this visit.    Allergies:   Penicillins    Social History:  The patient  reports  that he has quit smoking. He has quit using smokeless tobacco. He reports current alcohol use. He reports that he does not use drugs.   Family History:  The patient's family history includes Cancer in his brother and mother; Ovarian cancer in his sister, sister, and sister.    ROS:  Please see the history of present illness.   Otherwise, review of systems are positive for feeling better since the cath.   All other systems are reviewed and negative.    PHYSICAL EXAM: VS:  BP 122/68   Pulse 66   Ht 5\' 11"  (1.803 m)   Wt 189 lb 1.9 oz (85.8 kg)   SpO2 96%   BMI 26.38 kg/m  , BMI Body mass index is 26.38 kg/m. GEN: Well nourished, well  developed, in no acute distress  HEENT: normal  Neck: no JVD, carotid bruits, or masses Cardiac: RRR; no murmurs, rubs, or gallops,no edema  Respiratory:  clear to auscultation bilaterally, normal work of breathing GI: soft, nontender, nondistended, + BS MS: no deformity or atrophy ; no right wrist hematoma Skin: warm and dry, no rash Neuro:  Strength and sensation are intact Psych: euthymic mood, full affect   EKG:      Recent Labs: 11/03/2019: NT-Pro BNP <5 11/26/2019: BNP 7.8; TSH 1.930 12/22/2019: ALT 72; BUN 12; Creatinine, Ser 1.19; Hemoglobin 13.7; Platelets 349; Potassium 3.6; Sodium 140   Lipid Panel    Component Value Date/Time   CHOL 235 (H) 11/10/2019 1159   TRIG 96 11/10/2019 1159   HDL 55 11/10/2019 1159   CHOLHDL 4.3 11/10/2019 1159   LDLCALC 163 (H) 11/10/2019 1159     Other studies Reviewed: Additional studies/ records that were reviewed today with results demonstrating: labs reviewed.  TC 235.   ASSESSMENT AND PLAN:  1. CAD: Status post LAD PCI.  Continue aggressive secondary prevention.  Tolerating DAPT.  Cardiac rehab may not work with his work schedule. He is going to join a gym.  He may try to stop his Imdur when the supply runs out.  If no CP, he can stay off the medicine.   2. Hyperlipidemia: Started on atorvastatin.  Will be checked by Dr. Carlota Raspberry. 3. Stressed importance of meds.  Eats healthy as well.    Current medicines are reviewed at length with the patient today.  The patient concerns regarding his medicines were addressed.  The following changes have been made:  No change  Labs/ tests ordered today include:  No orders of the defined types were placed in this encounter.   Recommend 150 minutes/week of aerobic exercise Low fat, low carb, high fiber diet recommended  Disposition:   FU in 6 month   Signed, Larae Grooms, MD  12/24/2019 3:03 PM    [facility] [address], [place], [place]  [address] Phone:  [phone]; Fax: [phone]

## 2019-12-23 NOTE — Telephone Encounter (Signed)
Pt called regarding pharmacy needing approval for Flomax Rx.  EDCM contacted pharmacy to find that, through his insurance they needed permission for 90 day supply, but he could run through as 30 day supply and contact pt PCP.  EDCM confirmed to fill Rx as written.  EDCM contacted pt to have him pick up Rx at his conveince.

## 2019-12-24 ENCOUNTER — Encounter: Payer: Self-pay | Admitting: Interventional Cardiology

## 2019-12-24 ENCOUNTER — Ambulatory Visit (INDEPENDENT_AMBULATORY_CARE_PROVIDER_SITE_OTHER): Payer: Medicare HMO | Admitting: Interventional Cardiology

## 2019-12-24 ENCOUNTER — Other Ambulatory Visit: Payer: Self-pay

## 2019-12-24 VITALS — BP 122/68 | HR 66 | Ht 71.0 in | Wt 189.1 lb

## 2019-12-24 DIAGNOSIS — Z95820 Peripheral vascular angioplasty status with implants and grafts: Secondary | ICD-10-CM | POA: Diagnosis not present

## 2019-12-24 DIAGNOSIS — E782 Mixed hyperlipidemia: Secondary | ICD-10-CM

## 2019-12-24 NOTE — Patient Instructions (Signed)
Medication Instructions:  Your physician recommends that you continue on your current medications as directed. Please refer to the Current Medication list given to you today.  *If you need a refill on your cardiac medications before your next appointment, please call your pharmacy*   Lab Work: None today  If you have labs (blood work) drawn today and your tests are completely normal, you will receive your results only by: Marland Kitchen MyChart Message (if you have MyChart) OR . A paper copy in the mail If you have any lab test that is abnormal or we need to change your treatment, we will call you to review the results.   Testing/Procedures: None today   Follow-Up: At [facility], you and your health needs are our priority.  As part of our continuing mission to provide you with exceptional heart care, we have created designated Provider Care Teams.  These Care Teams include your primary Cardiologist (physician) and Advanced Practice Providers (APPs -  Physician Assistants and Nurse Practitioners) who all work together to provide you with the care you need, when you need it.  We recommend signing up for the patient portal called "MyChart".  Sign up information is provided on this After Visit Summary.  MyChart is used to connect with patients for Virtual Visits (Telemedicine).  Patients are able to view lab/test results, encounter notes, upcoming appointments, etc.  Non-urgent messages can be sent to your provider as well.   To learn more about what you can do with MyChart, go to [email or web address].    Your next appointment:   6 month(s)  The format for your next appointment:   In Person  Provider:   You may see Larae Grooms, MD or one of the following Advanced Practice Providers on your designated Care Team:    Melina Copa, PA-C  Ermalinda Barrios, PA-C    Other Instructions  High-Fiber Diet Fiber, also called dietary fiber, is a type of carbohydrate that is found in fruits,  vegetables, whole grains, and beans. A high-fiber diet can have many health benefits. Your health care provider may recommend a high-fiber diet to help:  Prevent constipation. Fiber can make your bowel movements more regular.  Lower your cholesterol.  Relieve the following conditions: ? Swelling of veins in the anus (hemorrhoids). ? Swelling and irritation (inflammation) of specific areas of the digestive tract (uncomplicated diverticulosis). ? A problem of the large intestine (colon) that sometimes causes pain and diarrhea (irritable bowel syndrome, IBS).  Prevent overeating as part of a weight-loss plan.  Prevent heart disease, type 2 diabetes, and certain cancers. What is my plan? The recommended daily fiber intake in grams (g) includes:  38 g for men age 13 or younger.  30 g for men over age 76.  55 g for women age 92 or younger.  21 g for women over age 42. You can get the recommended daily intake of dietary fiber by:  Eating a variety of fruits, vegetables, grains, and beans.  Taking a fiber supplement, if it is not possible to get enough fiber through your diet. What do I need to know about a high-fiber diet?  It is better to get fiber through food sources rather than from fiber supplements. There is not a lot of research about how effective supplements are.  Always check the fiber content on the nutrition facts label of any prepackaged food. Look for foods that contain 5 g of fiber or more per serving.  Talk with a diet and nutrition specialist (  dietitian) if you have questions about specific foods that are recommended or not recommended for your medical condition, especially if those foods are not listed below.  Gradually increase how much fiber you consume. If you increase your intake of dietary fiber too quickly, you may have bloating, cramping, or gas.  Drink plenty of water. Water helps you to digest fiber. What are tips for following this plan?  Eat a wide  variety of high-fiber foods.  Make sure that half of the grains that you eat each day are whole grains.  Eat breads and cereals that are made with whole-grain flour instead of refined flour or white flour.  Eat brown rice, bulgur wheat, or millet instead of white rice.  Start the day with a breakfast that is high in fiber, such as a cereal that contains 5 g of fiber or more per serving.  Use beans in place of meat in soups, salads, and pasta dishes.  Eat high-fiber snacks, such as berries, raw vegetables, nuts, and popcorn.  Choose whole fruits and vegetables instead of processed forms like juice or sauce. What foods can I eat?  Fruits Berries. Pears. Apples. Oranges. Avocado. Prunes and raisins. Dried figs. Vegetables Sweet potatoes. Spinach. Kale. Artichokes. Cabbage. Broccoli. Cauliflower. Green peas. Carrots. Squash. Grains Whole-grain breads. Multigrain cereal. Oats and oatmeal. Brown rice. Barley. Bulgur wheat. Millet. Quinoa. Bran muffins. Popcorn. Rye wafer crackers. Meats and other proteins Navy, kidney, and pinto beans. Soybeans. Split peas. Lentils. Nuts and seeds. Dairy Fiber-fortified yogurt. Beverages Fiber-fortified soy milk. Fiber-fortified orange juice. Other foods Fiber bars. The items listed above may not be a complete list of recommended foods and beverages. Contact a dietitian for more options. What foods are not recommended? Fruits Fruit juice. Cooked, strained fruit. Vegetables Fried potatoes. Canned vegetables. Well-cooked vegetables. Grains White bread. Pasta made with refined flour. White rice. Meats and other proteins Fatty cuts of meat. Fried chicken or fried fish. Dairy Milk. Yogurt. Cream cheese. Sour cream. Fats and oils Butters. Beverages Soft drinks. Other foods Cakes and pastries. The items listed above may not be a complete list of foods and beverages to avoid. Contact a dietitian for more information. Summary  Fiber is a type of  carbohydrate. It is found in fruits, vegetables, whole grains, and beans.  There are many health benefits of eating a high-fiber diet, such as preventing constipation, lowering blood cholesterol, helping with weight loss, and reducing your risk of heart disease, diabetes, and certain cancers.  Gradually increase your intake of fiber. Increasing too fast can result in cramping, bloating, and gas. Drink plenty of water while you increase your fiber.  The best sources of fiber include whole fruits and vegetables, whole grains, nuts, seeds, and beans. This information is not intended to replace advice given to you by your health care provider. Make sure you discuss any questions you have with your health care provider. Document Revised: 08/18/2017 Document Reviewed: 08/18/2017 Elsevier Patient Education  2020 Elsevier Inc.   

## 2019-12-29 ENCOUNTER — Other Ambulatory Visit: Payer: Self-pay | Admitting: Family Medicine

## 2019-12-29 DIAGNOSIS — R12 Heartburn: Secondary | ICD-10-CM

## 2019-12-29 NOTE — Telephone Encounter (Signed)
Pt is interested in the virtual cardiac rehab and will come in for orientation/walk test on 01/11/2020@9 :30am. Walk test only and to get set up for VCR.

## 2020-01-01 ENCOUNTER — Ambulatory Visit: Payer: Medicare HMO | Attending: Internal Medicine

## 2020-01-01 DIAGNOSIS — Z23 Encounter for immunization: Secondary | ICD-10-CM

## 2020-01-01 NOTE — Progress Notes (Signed)
   Covid-19 Vaccination Clinic  Name:  Peter Taylor    MRN: [id number] DOB: 03-04-1951  01/01/2020  Mr. Cancel was observed post Covid-19 immunization for 15 minutes without incident. He was provided with Vaccine Information Sheet and instruction to access the V-Safe system.   [name] was instructed to call 911 with any severe reactions post vaccine: Marland Kitchen Difficulty breathing  . Swelling of face and throat  . A fast heartbeat  . A bad rash all over body  . Dizziness and weakness   Immunizations Administered    Name Date Dose VIS Date Route   Pfizer COVID-19 Vaccine 01/01/2020  9:05 AM 0.3 mL 10/08/2019 Intramuscular   Manufacturer: [place]   Lot: [id number]   [place]: [id number]

## 2020-01-07 ENCOUNTER — Encounter (HOSPITAL_COMMUNITY)
Admission: RE | Admit: 2020-01-07 | Discharge: 2020-01-07 | Disposition: A | Discharge status: Home / Self Care | Payer: Self-pay | Source: Ambulatory Visit | Attending: Interventional Cardiology | Admitting: Interventional Cardiology

## 2020-01-07 ENCOUNTER — Other Ambulatory Visit: Payer: Self-pay

## 2020-01-07 NOTE — Progress Notes (Signed)
Virtual Cardiac Rehab Note:  Successful telephone encounter to Peter Taylor to confirm Cardiac Rehab orientation appointment for 01/11/20  at 0930. Nursing assessment completed. Instructions for appointment provided. Patient screening for Covid-19 negative. Patient consents to Virtual Cardiac Rehab Participation only through the [employer] app secondary to work schedule (see below). Invitation text message sent to [phone].  Pt is interested in participating in Virtual Cardiac and Pulmonary Rehab. Pt advised that Virtual Cardiac and Pulmonary Rehab is provided at no cost to the patient.  Checklist:  1. Pt has smart device  ie smartphone and/or ipad for downloading an app  Yes 2. Reliable internet/wifi service    Yes 3. Understands how to use their smartphone and navigate within an app.  Yes   Pt verbalized understanding and is in agreement.          Confirm Consent - In the setting of the current Covid19 crisis, you are scheduled for a phone visit with your Cardiac or Pulmonary team member.  Just as we do with many in-gym visits, in order for you to participate in this visit, we must obtain consent.  If you'd like, I can send this to your mychart (if signed up) or email for you to review.  Otherwise, I can obtain your verbal consent now.  By agreeing to a telephone visit, we'd like you to understand that the technology does not allow for your Cardiac or Pulmonary Rehab team member to perform a physical assessment, and thus may limit their ability to fully assess your ability to perform exercise programs. If your provider identifies any concerns that need to be evaluated in person, we will make arrangements to do so.  Finally, though the technology is pretty good, we cannot assure that it will always work on either your or our end and we cannot ensure that we have a secure connection.  Cardiac and Pulmonary Rehab Telehealth visits and "At Home" cardiac and pulmonary rehab are provided at no  cost to you.        Are you willing to proceed?"        STAFF: Did the patient verbally acknowledge consent to telehealth visit? Document YES/NO here: Yes     Annarae Macnair E. Rollene Rotunda, RN  Cardiac and Pulmonary Rehab Staff       Date 01/07/20 @ Time 15:25

## 2020-01-10 ENCOUNTER — Telehealth (HOSPITAL_COMMUNITY): Payer: Self-pay

## 2020-01-10 NOTE — Telephone Encounter (Signed)
Cardiac Rehab Medication Review by a Pharmacist  Does the patient  feel that his/her medications are working for him/her?  Yes  Has the patient been experiencing any side effects to the medications prescribed?  no  Does the patient measure his/her own blood pressure or blood glucose at home?  no   Does the patient have any problems obtaining medications due to transportation or finances?   no  Understanding of regimen: good Understanding of indications: good Potential of compliance: good    Pharmacist comments: N/A    Peter Taylor 01/10/2020 12:21 PM

## 2020-01-11 ENCOUNTER — Encounter (HOSPITAL_COMMUNITY)
Admission: RE | Admit: 2020-01-11 | Discharge: 2020-01-11 | Disposition: A | Discharge status: Home / Self Care | Payer: Medicare HMO | Source: Ambulatory Visit | Attending: Interventional Cardiology | Admitting: Interventional Cardiology

## 2020-01-11 ENCOUNTER — Other Ambulatory Visit: Payer: Self-pay

## 2020-01-11 ENCOUNTER — Encounter (HOSPITAL_COMMUNITY): Payer: Self-pay

## 2020-01-11 VITALS — BP 100/66 | HR 59 | Temp 96.8°F | Ht 71.5 in | Wt 187.2 lb

## 2020-01-11 DIAGNOSIS — Z955 Presence of coronary angioplasty implant and graft: Secondary | ICD-10-CM | POA: Insufficient documentation

## 2020-01-11 NOTE — Progress Notes (Addendum)
Patient will participate in the virtual cardiac rehab program via the [employer] app. Reviewed home exercise guidelines with patient including endpoints, temperature precautions, target heart rate and rate of perceived exertion. Pt plans to walk as his mode of home exercise. Orange and blue exercise bands given for resistance stretches. Pt voices understanding of instructions given.  Sol Passer, MS, ACSM CEP

## 2020-01-11 NOTE — Progress Notes (Signed)
Cardiac Individual Treatment Plan  Patient Details  Name: Peter Taylor MRN: [id number] Date of Birth: 06-04-1951 Referring Provider:     CARDIAC REHAB PHASE II ORIENTATION from 01/11/2020 in [facility]  Referring Provider  Larae Grooms, MD      Initial Encounter Date:    CARDIAC REHAB PHASE II ORIENTATION from 01/11/2020 in [facility]  Date  01/11/20      Visit Diagnosis: Status post coronary artery stent placement  Patient's Home Medications on Admission:  Current Outpatient Medications:  .  aspirin EC 81 MG tablet, Take 1 tablet (81 mg total) by mouth daily., Disp: 90 tablet, Rfl: 3 .  atorvastatin (LIPITOR) 20 MG tablet, Take 1 tablet (20 mg total) by mouth daily., Disp: 90 tablet, Rfl: 3 .  clopidogrel (PLAVIX) 75 MG tablet, Take 1 tablet (75 mg total) by mouth daily with breakfast., Disp: 30 tablet, Rfl: 11 .  isosorbide mononitrate (IMDUR) 30 MG 24 hr tablet, Take 1 tablet (30 mg total) by mouth daily., Disp: 90 tablet, Rfl: 3 .  nitroGLYCERIN (NITROSTAT) 0.4 MG SL tablet, Place 1 tablet (0.4 mg total) under the tongue every 5 (five) minutes as needed for chest pain., Disp: 25 tablet, Rfl: 3 .  pantoprazole (PROTONIX) 40 MG tablet, Take 1 tablet (40 mg total) by mouth daily., Disp: 30 tablet, Rfl: 6 .  phenazopyridine (PYRIDIUM) 200 MG tablet, Take 1 tablet (200 mg total) by mouth 3 (three) times daily. (Patient not taking: Reported on 01/10/2020), Disp: 6 tablet, Rfl: 0 .  tamsulosin (FLOMAX) 0.4 MG CAPS capsule, Take 1 capsule (0.4 mg total) by mouth daily., Disp: 30 capsule, Rfl: 0  Past Medical History: Past Medical History:  Diagnosis Date  . CAD (coronary artery disease), native coronary artery    a. Cath 12/10/19: 90% mLAD s/p DES, mild non obstructive disease in RCA and Cx  . GERD (gastroesophageal reflux disease)   . Hyperlipidemia   . Substance abuse ([place])   . 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   Exercise Prescription Goal: Initial exercise prescription builds to 30-45 minutes a day of aerobic activity, 2-3 days per week.  Home exercise guidelines will be given to patient during program as part of exercise prescription that the participant will acknowledge.  Activity Barriers & Risk Stratification: Activity Barriers & Cardiac Risk Stratification - 01/11/20 1008      Activity Barriers & Cardiac Risk Stratification   Activity Barriers  None    Cardiac Risk Stratification  Low       6 Minute Walk: 6 Minute Walk    Row Name 01/11/20 1027         6 Minute Walk   Phase  Initial  Distance  1406 feet     Walk Time  6 minutes     # of Rest Breaks  0     MPH  2.66     METS  3.21     RPE  11     Perceived Dyspnea   0     VO2 Peak  11.25     Symptoms  No     Resting HR  59 bpm     Resting BP  100/66     Resting Oxygen Saturation   99 %     Exercise Oxygen Saturation  during 6 min walk  99 %     Max Ex. HR  73 bpm     Max Ex. BP  128/68     2 Minute Post BP  118/78        Oxygen Initial Assessment:   Oxygen Re-Evaluation:   Oxygen Discharge (Final Oxygen Re-Evaluation):   Initial Exercise Prescription: Initial Exercise Prescription - 01/11/20 1500      Date of Initial Exercise RX and Referring Provider   Date   01/11/20    Referring Provider  Larae Grooms, MD    Expected Discharge Date  03/10/20      Track   Minutes  30      Prescription Details   Frequency (times per week)  4-5    Duration  Progress to 30 minutes of continuous aerobic without signs/symptoms of physical distress      Intensity   THRR 40-80% of Max Heartrate  62-123    Ratings of Perceived Exertion  11-13    Perceived Dyspnea  0-4      Progression   Progression  Continue to progress workloads to maintain intensity without signs/symptoms of physical distress.      Resistance Training   Training Prescription  Yes    Weight  --   Orange and blue band   Reps  10-15       Perform Capillary Blood Glucose checks as needed.  Exercise Prescription Changes:   Exercise Comments:  Exercise Comments    Row Name 01/11/20 1017           Exercise Comments  Reviewed home exercise guidelines with patient.          Exercise Goals and Review:  Exercise Goals    Row Name 01/11/20 1007             Exercise Goals   Increase Physical Activity  Yes       Intervention  Provide advice, education, support and counseling about physical activity/exercise needs.;Develop an individualized exercise prescription for aerobic and resistive training based on initial evaluation findings, risk stratification, comorbidities and participant's personal goals.       Expected Outcomes  Short Term: Attend rehab on a regular basis to increase amount of physical activity.;Long Term: Exercising regularly at least 3-5 days a week.;Long Term: Add in home exercise to make exercise part of routine and to increase amount of physical activity.       Increase Strength and Stamina  Yes       Intervention  Provide advice, education, support and counseling about physical activity/exercise needs.;Develop an individualized exercise prescription for aerobic and resistive training based on initial evaluation findings, risk stratification, comorbidities  and participant's personal goals.       Expected Outcomes  Short Term: Increase workloads from initial exercise prescription for resistance, speed, and METs.;Short Term: Perform resistance training exercises routinely during rehab and  add in resistance training at home;Long Term: Improve cardiorespiratory fitness, muscular endurance and strength as measured by increased METs and functional capacity (6MWT)       Able to understand and use rate of perceived exertion (RPE) scale  Yes       Intervention  Provide education and explanation on how to use RPE scale       Expected Outcomes  Short Term: Able to use RPE daily in rehab to express subjective intensity level;Long Term:  Able to use RPE to guide intensity level when exercising independently       Knowledge and understanding of Target Heart Rate Range (THRR)  Yes       Intervention  Provide education and explanation of THRR including how the numbers were predicted and where they are located for reference       Expected Outcomes  Short Term: Able to state/look up THRR;Long Term: Able to use THRR to govern intensity when exercising independently;Short Term: Able to use daily as guideline for intensity in rehab       Able to check pulse independently  Yes       Intervention  Provide education and demonstration on how to check pulse in carotid and radial arteries.;Review the importance of being able to check your own pulse for safety during independent exercise       Expected Outcomes  Short Term: Able to explain why pulse checking is important during independent exercise;Long Term: Able to check pulse independently and accurately       Understanding of Exercise Prescription  Yes       Intervention  Provide education, explanation, and written materials on patient's individual exercise prescription       Expected Outcomes  Short Term: Able to explain program exercise prescription;Long Term: Able to explain home exercise prescription to exercise  independently          Exercise Goals Re-Evaluation :   Discharge Exercise Prescription (Final Exercise Prescription Changes):   Nutrition:  Target Goals: Understanding of nutrition guidelines, daily intake of sodium 1500mg , cholesterol 200mg , calories 30% from fat and 7% or less from saturated fats, daily to have 5 or more servings of fruits and vegetables.  Biometrics: Pre Biometrics - 01/11/20 0946      Pre Biometrics   Waist Circumference  37 inches    Hip Circumference  40.5 inches    Waist to Hip Ratio  0.91 %    Triceps Skinfold  9 mm    % Body Fat  23 %    Grip Strength  41.5 kg    Flexibility  0 in    Single Leg Stand  10.43 seconds        Nutrition Therapy Plan and Nutrition Goals:   Nutrition Assessments:   Nutrition Goals Re-Evaluation:   Nutrition Goals Re-Evaluation:   Nutrition Goals Discharge (Final Nutrition Goals Re-Evaluation):   Psychosocial: Target Goals: Acknowledge presence or absence of significant depression and/or stress, maximize coping skills, provide positive support system. Participant is able to verbalize types and ability to use techniques and skills needed for reducing stress and depression.  Initial Review & Psychosocial Screening: Initial Psych Review & Screening - 01/11/20 0949      Initial Review   Current issues with  None Identified      Family Dynamics   Good Support System?  Yes    Comments  Mr. Rotenberg has a positive attitude and outlook. He has overcome homelessness in the past 3 years, lives  with his sister to help with her medical care, works as a Tax inspector and "makes good money". He attends church weekly and has a strong support system. Faith in God is very important to him. 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 He has a very positive outlook on life. No psychosocial barriers to participation in [facility] identified. No interventions needed.      Barriers   Psychosocial barriers  to participate in program  There are no identifiable barriers or psychosocial needs.      Screening Interventions   Interventions  Encouraged to exercise       Quality of Life Scores: Quality of Life - 01/11/20 1517      Quality of Life   Select  Quality of Life      Quality of Life Scores   Health/Function Pre  24.7 %    Socioeconomic Pre  24.25 %    Psych/Spiritual Pre  26.71 %    Family Pre  28.3 %    GLOBAL Pre  25.51 %      Scores of 19 and below usually indicate a poorer quality of life in these areas.  A difference of  2-3 points is a clinically meaningful difference.  A difference of 2-3 points in the total score of the Quality of Life Index has been associated with significant improvement in overall quality of life, self-image, physical symptoms, and general health in studies assessing change in quality of life.  PHQ-9: Recent Review Flowsheet Data    Depression screen [facility] 2/9 01/11/2020 12/15/2019 11/26/2019 11/10/2019 11/03/2019   Decreased Interest 0 0 0 0 0   Down, Depressed, Hopeless 0 0 0 0 0   PHQ - 2 Score 0 0 0 0 0     Interpretation of Total Score  Total Score Depression Severity:  1-4 = Minimal depression, 5-9 = Mild depression, 10-14 = Moderate depression, 15-19 = Moderately severe depression, 20-27 = Severe depression   Psychosocial Evaluation and Intervention:   Psychosocial Re-Evaluation:   Psychosocial Discharge (Final Psychosocial Re-Evaluation):   Vocational Rehabilitation: Provide vocational rehab assistance to qualifying candidates.   Vocational Rehab Evaluation & Intervention:   Education: Education Goals: Education classes will be provided on a weekly basis, covering required topics. Participant will state understanding/return demonstration of topics presented.  Learning Barriers/Preferences:   Education Topics: Count Your Pulse:  -Group instruction provided by verbal instruction, demonstration, patient participation and written  materials to support subject.  Instructors address importance of being able to find your pulse and how to count your pulse when at home without a heart monitor.  Patients get hands on experience counting their pulse with staff help and individually.   Heart Attack, Angina, and Risk Factor Modification:  -Group instruction provided by verbal instruction, video, and written materials to support subject.  Instructors address signs and symptoms of angina and heart attacks.    Also discuss risk factors for heart disease and how to make changes to improve heart health risk factors.   Functional Fitness:  -Group instruction provided by verbal instruction, demonstration, patient participation, and written materials to support subject.  Instructors address safety measures for doing things around the house.  Discuss how to get up and down off the floor, how to pick things up properly, how to safely get out of a chair without assistance, and balance training.   Meditation and Mindfulness:  -Group instruction provided by verbal instruction, patient participation, and written materials to  support subject.  Instructor addresses importance of mindfulness and meditation practice to help reduce stress and improve awareness.  Instructor also leads participants through a meditation exercise.    Stretching for Flexibility and Mobility:  -Group instruction provided by verbal instruction, patient participation, and written materials to support subject.  Instructors lead participants through series of stretches that are designed to increase flexibility thus improving mobility.  These stretches are additional exercise for major muscle groups that are typically performed during regular warm up and cool down.   Hands Only CPR:  -Group verbal, video, and participation provides a basic overview of AHA guidelines for community CPR. Role-play of emergencies allow participants the opportunity to practice calling for help and  chest compression technique with discussion of AED use.   Hypertension: -Group verbal and written instruction that provides a basic overview of hypertension including the most recent diagnostic guidelines, risk factor reduction with self-care instructions and medication management.    Nutrition I class: Heart Healthy Eating:  -Group instruction provided by PowerPoint slides, verbal discussion, and written materials to support subject matter. The instructor gives an explanation and review of the Therapeutic Lifestyle Changes diet recommendations, which includes a discussion on lipid goals, dietary fat, sodium, fiber, plant stanol/sterol esters, sugar, and the components of a well-balanced, healthy diet.   Nutrition II class: Lifestyle Skills:  -Group instruction provided by PowerPoint slides, verbal discussion, and written materials to support subject matter. The instructor gives an explanation and review of label reading, grocery shopping for heart health, heart healthy recipe modifications, and ways to make healthier choices when eating out.   Diabetes Question & Answer:  -Group instruction provided by PowerPoint slides, verbal discussion, and written materials to support subject matter. The instructor gives an explanation and review of diabetes co-morbidities, pre- and post-prandial blood glucose goals, pre-exercise blood glucose goals, signs, symptoms, and treatment of hypoglycemia and hyperglycemia, and foot care basics.   Diabetes Blitz:  -Group instruction provided by PowerPoint slides, verbal discussion, and written materials to support subject matter. The instructor gives an explanation and review of the physiology behind type 1 and type 2 diabetes, diabetes medications and rational behind using different medications, pre- and post-prandial blood glucose recommendations and Hemoglobin A1c goals, diabetes diet, and exercise including blood glucose guidelines for exercising safely.     Portion Distortion:  -Group instruction provided by PowerPoint slides, verbal discussion, written materials, and food models to support subject matter. The instructor gives an explanation of serving size versus portion size, changes in portions sizes over the last 20 years, and what consists of a serving from each food group.   Stress Management:  -Group instruction provided by verbal instruction, video, and written materials to support subject matter.  Instructors review role of stress in heart disease and how to cope with stress positively.     Exercising on Your Own:  -Group instruction provided by verbal instruction, power point, and written materials to support subject.  Instructors discuss benefits of exercise, components of exercise, frequency and intensity of exercise, and end points for exercise.  Also discuss use of nitroglycerin and activating EMS.  Review options of places to exercise outside of rehab.  Review guidelines for sex with heart disease.   Cardiac Drugs I:  -Group instruction provided by verbal instruction and written materials to support subject.  Instructor reviews cardiac drug classes: antiplatelets, anticoagulants, beta blockers, and statins.  Instructor discusses reasons, side effects, and lifestyle considerations for each drug class.   Cardiac Drugs II:  -  Group instruction provided by verbal instruction and written materials to support subject.  Instructor reviews cardiac drug classes: angiotensin converting enzyme inhibitors (ACE-I), angiotensin II receptor blockers (ARBs), nitrates, and calcium channel blockers.  Instructor discusses reasons, side effects, and lifestyle considerations for each drug class.   Anatomy and Physiology of the Circulatory System:  Group verbal and written instruction and models provide basic cardiac anatomy and physiology, with the coronary electrical and arterial systems. Review of: AMI, Angina, Valve disease, Heart Failure,  Peripheral Artery Disease, Cardiac Arrhythmia, Pacemakers, and the ICD.   Other Education:  -Group or individual verbal, written, or video instructions that support the educational goals of the cardiac rehab program.   Holiday Eating Survival Tips:  -Group instruction provided by PowerPoint slides, verbal discussion, and written materials to support subject matter. The instructor gives patients tips, tricks, and techniques to help them not only survive but enjoy the holidays despite the onslaught of food that accompanies the holidays.   Knowledge Questionnaire Score: Knowledge Questionnaire Score - 01/11/20 1517      Knowledge Questionnaire Score   Pre Score  15/24       Core Components/Risk Factors/Patient Goals at Admission: Personal Goals and Risk Factors at Admission - 01/11/20 1519      Core Components/Risk Factors/Patient Goals on Admission   Lipids  Yes    Intervention  Provide education and support for participant on nutrition & aerobic/resistive exercise along with prescribed medications to achieve LDL 70mg , HDL >40mg .    Expected Outcomes  Short Term: Participant states understanding of desired cholesterol values and is compliant with medications prescribed. Participant is following exercise prescription and nutrition guidelines.;Long Term: Cholesterol controlled with medications as prescribed, with individualized exercise RX and with personalized nutrition plan. Value goals: LDL < 70mg , HDL > 40 mg.       Core Components/Risk Factors/Patient Goals Review:    Core Components/Risk Factors/Patient Goals at Discharge (Final Review):    ITP Comments: ITP Comments    Row Name 01/11/20 0933           ITP Comments  Dr. Fransico Him, Medical Director [facility]          Comments: Patient attended orientation on 01/11/2020 to review rules and guidelines for program.  Completed 6 minute walk test, Intitial ITP, and exercise prescription.  VSS. Telemetry-SB/SR.   Asymptomatic. Safety measures and social distancing in place per CDC guidelines. Patient is assisted with downloading the [employer] App and extensive tutorial provided. Patient will be notified when exercise prescription has been completed and entered into app.

## 2020-01-11 NOTE — Progress Notes (Signed)
Virtual Cardiac Rehab Note:  Spoke to pt regarding Virtual Cardiac  and Pulmonary Rehab.  Pt  was able to download the [employer] app on their smart device with no issues. Pt set up their account and received the following welcome message -"Welcome to the [facility] and Pulmonary Rehabilitation program. We hope that you will find the exercise program beneficial in your recovery process. Our staff is available to assist with any questions/concerns about your exercise routine. Best wishes". Brief orientation provided to with the advisement to watch the "Intro to Rehab" series located under the Resource tab. Pt verbalized understanding. Will continue to follow and monitor pt progress with feedback as needed.  Petra Sargeant E. Rollene Rotunda RN, BSN [place]. [facility]  Cardiac and Pulmonary Rehabilitation Phone: [phone] Fax: [phone]

## 2020-01-12 ENCOUNTER — Encounter: Payer: Self-pay | Admitting: Family Medicine

## 2020-01-12 ENCOUNTER — Ambulatory Visit (INDEPENDENT_AMBULATORY_CARE_PROVIDER_SITE_OTHER): Payer: Medicare HMO | Admitting: Family Medicine

## 2020-01-12 VITALS — BP 134/83 | HR 54 | Temp 97.2°F | Ht 71.5 in | Wt 185.0 lb

## 2020-01-12 DIAGNOSIS — I2510 Atherosclerotic heart disease of native coronary artery without angina pectoris: Secondary | ICD-10-CM

## 2020-01-12 DIAGNOSIS — E785 Hyperlipidemia, unspecified: Secondary | ICD-10-CM | POA: Diagnosis not present

## 2020-01-12 DIAGNOSIS — K219 Gastro-esophageal reflux disease without esophagitis: Secondary | ICD-10-CM | POA: Diagnosis not present

## 2020-01-12 NOTE — Progress Notes (Signed)
Subjective:  Patient ID: Peter Taylor, male    DOB: 11-10-1950  Age: 69 y.o. MRN: [id number]  CC:  Chief Complaint  Patient presents with  . Follow-up    on GEERD and lipids. Pt hasn't had any issues with eirhter of these conditions. pt has stoped eating greasy foods. pt states this has help alot.     HPI Peter Taylor presents for   Coronary artery disease, hyperlipidemia Cardiology Dr. Irish Lack,  appointment February 26.  90% stenosed mid LAD in cath in February, drug-eluting stent was placed.  Plan for dual antiplatelet therapy.  Plan for possible rehab through joining the gym as cardiac rehab may not work with his work schedule.  Plan on stopping Imdur once supply runs out, as well as chest pain does not recur.  Continued on atorvastatin for hyperlipidemia. lipitor 20mg  qd - no new myalgias/side effects. No new side effects. Due for repeat testing.  Fasting today.  Is planning on cardiac rehab.  No new bleeding.   Lab Results  Component Value Date   CHOL 235 (H) 11/10/2019   HDL 55 11/10/2019   LDLCALC 163 (H) 11/10/2019   TRIG 96 11/10/2019   CHOLHDL 4.3 11/10/2019    GERD:  Protonix 40 mg daily. Working well.  Saw gastroenterology January 28.  Endoscopy 12/07/2019 was normal.   History Patient Active Problem List   Diagnosis Date Noted  . Coronary artery disease   . Hyperlipidemia 11/11/2019   Past Medical History:  Diagnosis Date  . CAD (coronary artery disease), native coronary artery    a. Cath 12/10/19: 90% mLAD s/p DES, mild non obstructive disease in RCA and Cx  . GERD (gastroesophageal reflux disease)   . Hyperlipidemia   . Substance abuse ([place])   . Tuberculosis    Past Surgical History:  Procedure Laterality Date  . CORONARY STENT INTERVENTION N/A 12/10/2019   Procedure: CORONARY STENT INTERVENTION;  Surgeon: Jettie Booze, MD;  Location: [facility];  Service: Cardiovascular;  Laterality: N/A;  . LEFT HEART CATH AND CORONARY  ANGIOGRAPHY N/A 12/10/2019   Procedure: LEFT HEART CATH AND CORONARY ANGIOGRAPHY;  Surgeon: Jettie Booze, MD;  Location: [facility];  Service: Cardiovascular;  Laterality: N/A;  . NO PAST SURGERIES     Allergies  Allergen Reactions  . Penicillins Hives    Did it involve swelling of the face/tongue/throat, SOB, or low BP? No Did it involve sudden or severe rash/hives, skin peeling, or any reaction on the inside of your mouth or nose? No Did you need to seek medical attention at a hospital or doctor's office? No When did it last happen?35+ years If all above answers are "NO", may proceed with cephalosporin use.    Prior to Admission medications   Medication Sig Start Date End Date Taking? Authorizing Provider  aspirin EC 81 MG tablet Take 1 tablet (81 mg total) by mouth daily. 11/11/19  Yes Jettie Booze, MD  atorvastatin (LIPITOR) 20 MG tablet Take 1 tablet (20 mg total) by mouth daily. 11/11/19  Yes Jettie Booze, MD  clopidogrel (PLAVIX) 75 MG tablet Take 1 tablet (75 mg total) by mouth daily with breakfast. 12/11/19  Yes Bhagat, Bhavinkumar, PA  isosorbide mononitrate (IMDUR) 30 MG 24 hr tablet Take 1 tablet (30 mg total) by mouth daily. 12/01/19  Yes Jettie Booze, MD  nitroGLYCERIN (NITROSTAT) 0.4 MG SL tablet Place 1 tablet (0.4 mg total) under the tongue every 5 (five) minutes  as needed for chest pain. 12/01/19  Yes Jettie Booze, MD  pantoprazole (PROTONIX) 40 MG tablet Take 1 tablet (40 mg total) by mouth daily. 12/10/19 12/09/20 Yes Bhagat, Bhavinkumar, PA  phenazopyridine (PYRIDIUM) 200 MG tablet Take 1 tablet (200 mg total) by mouth 3 (three) times daily. 12/20/19  Yes Carlisle Cater, PA-C  tamsulosin (FLOMAX) 0.4 MG CAPS capsule Take 1 capsule (0.4 mg total) by mouth daily. 12/22/19  Yes Sherwood Gambler, MD   Social History   Socioeconomic History  . Marital status: Single    Spouse name: Not on file  . Number of children: 8  . Years of  education: Not on file  . Highest education level: Not on file  Occupational History  . Occupation: truck Geophysicist/field seismologist  Tobacco Use  . Smoking status: Former Research scientist (life sciences)  . Smokeless tobacco: Former Network engineer and Sexual Activity  . Alcohol use: Yes    Comment: occ  . Drug use: No  . Sexual activity: Not on file  Other Topics Concern  . Not on file  Social History Narrative  . Not on file   Social Determinants of Health   Financial Resource Strain: Low Risk   . Difficulty of Paying Living Expenses: Not hard at all  Food Insecurity: No Food Insecurity  . Worried About Charity fundraiser in the Last Year: Never true  . Ran Out of Food in the Last Year: Never true  Transportation Needs: No Transportation Needs  . Lack of Transportation (Medical): No  . Lack of Transportation (Non-Medical): No  Physical Activity:   . Days of Exercise per Week:   . Minutes of Exercise per Session:   Stress: No Stress Concern Present  . Feeling of Stress : Not at all  Social Connections: Moderately Isolated  . Frequency of Communication with Friends and Family: Once a week  . Frequency of Social Gatherings with Friends and Family: Once a week  . Attends Religious Services: More than 4 times per year  . Active Member of Clubs or Organizations: No  . Attends Archivist Meetings: Never  . Marital Status: Divorced  Human resources officer Violence:   . Fear of Current or Ex-Partner:   . Emotionally Abused:   Marland Kitchen Physically Abused:   . Sexually Abused:     Review of Systems  Constitutional: Negative for fatigue and unexpected weight change.  Eyes: Negative for visual disturbance.  Respiratory: Negative for cough, chest tightness and shortness of breath.   Cardiovascular: Negative for chest pain, palpitations and leg swelling.  Gastrointestinal: Negative for abdominal pain and blood in stool.  Neurological: Negative for dizziness, light-headedness and headaches.     Objective:   Vitals:    01/12/20 1045  BP: 134/83  Pulse: (!) 54  Temp: (!) 97.2 F (36.2 C)  TempSrc: Temporal  SpO2: 99%  Weight: 185 lb (83.9 kg)  Height: 5' 11.5" (1.816 m)     Physical Exam Vitals reviewed.  Constitutional:      Appearance: He is well-developed.  HENT:     Head: Normocephalic and atraumatic.  Eyes:     Pupils: Pupils are equal, round, and reactive to light.  Neck:     Vascular: No carotid bruit or JVD.  Cardiovascular:     Rate and Rhythm: Normal rate and regular rhythm.     Heart sounds: Normal heart sounds. No murmur.  Pulmonary:     Effort: Pulmonary effort is normal.     Breath sounds: Normal breath  sounds. No rales.  Skin:    General: Skin is warm and dry.  Neurological:     Mental Status: He is alert and oriented to person, place, and time.  Psychiatric:        Mood and Affect: Mood normal.        Behavior: Behavior normal.     Assessment & Plan:  Peter Taylor is a 69 y.o. male . Hyperlipidemia, unspecified hyperlipidemia type - Plan: Lipid panel Coronary artery disease involving native coronary artery of native heart without angina pectoris  -Doing well status post intervention for CAD.  Cardiac rehab.  Tolerating dual antiplatelet therapy without any bleeding.  Tolerating statin, updated lipids ordered.  Follow-up with cardiology as planned  Gastroesophageal reflux disease without esophagitis  -Stable, continue Protonix.   No orders of the defined types were placed in this encounter.  Patient Instructions    No medication changes, keep follow-up with cardiology and cardiac rehab as planned.  Recheck in 6 months.   If you have lab work done today you will be contacted with your lab results within the next 2 weeks.  If you have not heard from [place] then please contact us. The fastest way to get your results is to register for My Chart.   IF you received an x-ray today, you will receive an invoice from [facility] Radiology. Please contact [facility]  Radiology at [phone] with questions or concerns regarding your invoice.   IF you received labwork today, you will receive an invoice from [facility]. Please contact [facility] at [phone] with questions or concerns regarding your invoice.   Our billing staff will not be able to assist you with questions regarding bills from these companies.  You will be contacted with the lab results as soon as they are available. The fastest way to get your results is to activate your My Chart account. Instructions are located on the last page of this paperwork. If you have not heard from [place] regarding the results in 2 weeks, please contact this office.         Signed, Merri Ray, MD [facility]

## 2020-01-12 NOTE — Patient Instructions (Addendum)
  No medication changes, keep follow-up with cardiology and cardiac rehab as planned.  Recheck in 6 months.   If you have lab work done today you will be contacted with your lab results within the next 2 weeks.  If you have not heard from [place] then please contact us. The fastest way to get your results is to register for My Chart.   IF you received an x-ray today, you will receive an invoice from [facility] Radiology. Please contact [facility] Radiology at [phone] with questions or concerns regarding your invoice.   IF you received labwork today, you will receive an invoice from [facility]. Please contact [facility] at [phone] with questions or concerns regarding your invoice.   Our billing staff will not be able to assist you with questions regarding bills from these companies.  You will be contacted with the lab results as soon as they are available. The fastest way to get your results is to activate your My Chart account. Instructions are located on the last page of this paperwork. If you have not heard from [place] regarding the results in 2 weeks, please contact this office.

## 2020-01-13 ENCOUNTER — Encounter (HOSPITAL_COMMUNITY): Payer: Medicare HMO

## 2020-01-13 ENCOUNTER — Encounter: Payer: Self-pay | Admitting: Family Medicine

## 2020-01-13 LAB — LIPID PANEL
Chol/HDL Ratio: 2.7 ratio (ref 0.0–5.0)
Cholesterol, Total: 146 mg/dL (ref 100–199)
HDL: 54 mg/dL (ref >39)
LDL Chol Calc (NIH): 78 mg/dL (ref 0–99)
Triglycerides: 72 mg/dL (ref 0–149)
VLDL Cholesterol Cal: 14 mg/dL (ref 5–40)

## 2020-01-14 ENCOUNTER — Encounter (HOSPITAL_COMMUNITY)
Admission: RE | Admit: 2020-01-14 | Discharge: 2020-01-14 | Disposition: A | Discharge status: Home / Self Care | Payer: Medicare HMO | Source: Ambulatory Visit | Attending: Interventional Cardiology | Admitting: Interventional Cardiology

## 2020-01-14 ENCOUNTER — Other Ambulatory Visit: Payer: Self-pay

## 2020-01-14 NOTE — Progress Notes (Signed)
Virtual Cardiac Rehab: Nutrition Note  Successful phone encounter with pt to schedule a virtual nutrition assessment.Scheduled for March 26th at 9 am. Informed pt that this will be a telephone visit. Pt has no questions or concerns today.   Michaele Offer, MS, RDN, LDN

## 2020-01-17 DIAGNOSIS — N402 Nodular prostate without lower urinary tract symptoms: Secondary | ICD-10-CM | POA: Diagnosis not present

## 2020-01-17 DIAGNOSIS — R3912 Poor urinary stream: Secondary | ICD-10-CM | POA: Diagnosis not present

## 2020-01-21 ENCOUNTER — Telehealth (HOSPITAL_COMMUNITY): Payer: Self-pay

## 2020-01-21 ENCOUNTER — Encounter (HOSPITAL_COMMUNITY): Payer: Medicare HMO

## 2020-01-21 NOTE — Progress Notes (Signed)
Letter sent.

## 2020-01-22 ENCOUNTER — Ambulatory Visit: Payer: Medicare HMO | Attending: Internal Medicine

## 2020-01-22 DIAGNOSIS — Z23 Encounter for immunization: Secondary | ICD-10-CM

## 2020-01-22 NOTE — Progress Notes (Signed)
   Covid-19 Vaccination Clinic  Name:  Peter Taylor    MRN: [id number] DOB: 04/28/1951  01/22/2020  [name] was observed post Covid-19 immunization for 15 minutes without incident. He was provided with Vaccine Information Sheet and instruction to access the V-Safe system.   [name] was instructed to call 911 with any severe reactions post vaccine: Marland Kitchen Difficulty breathing  . Swelling of face and throat  . A fast heartbeat  . A bad rash all over body  . Dizziness and weakness   Immunizations Administered    Name Date Dose VIS Date Route   Pfizer COVID-19 Vaccine 01/22/2020  9:30 AM 0.3 mL 10/08/2019 Intramuscular   Manufacturer: Imperial   Lot: [id number]   Evan: [id number]

## 2020-02-29 ENCOUNTER — Telehealth: Payer: Self-pay | Admitting: Interventional Cardiology

## 2020-02-29 NOTE — Telephone Encounter (Signed)
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 He would like to know if it is ready for him to pick up.

## 2020-03-01 NOTE — Telephone Encounter (Signed)
Attempted to contact patient but there was no answer and VM did not pick up. This patient is a patient of Dr. Hassell Done, however I have not received a DOT form for him. I have reached out to Dr. Allison Quarry RN to see if it went to him. She states that she will check when she is back in the office tomorrow.

## 2020-03-02 NOTE — Telephone Encounter (Signed)
Patient returning call.

## 2020-03-02 NOTE — Telephone Encounter (Signed)
Per Dr. Allison Quarry RN, there is not a DOT form at their office for this patient.   Called and spoke to patient and made him aware that we do not have his DOT form. He states that he will drop it off again tomorrow.

## 2020-03-06 NOTE — Telephone Encounter (Signed)
DOT form received and completed by Dr. Irish Lack. Form given to Lorriane Shire in Medical Records along with med list and cath report to be faxed over to [employer] [facility]. Patient made aware.

## 2020-03-08 NOTE — Telephone Encounter (Signed)
Follow up   Pt calling back, pt said [facility] not yet receive report from [place]. Pt provide fax number [phone]. He also said if he can get copy of it and pick it up tomorrow  Please advise

## 2020-03-20 ENCOUNTER — Encounter (HOSPITAL_COMMUNITY): Payer: Self-pay

## 2020-03-20 NOTE — Progress Notes (Signed)
Discharge Progress Report  Patient Details  Name: Peter Taylor MRN: [id number] Date of Birth: 26-Dec-1950 Referring Provider:     CARDIAC REHAB PHASE II ORIENTATION from 01/11/2020 in [facility]  Referring Provider  Larae Grooms, MD       Number of Visits: VCR 01/11/20-03/20/20 (patient did not utilize app)  Reason for Discharge:  Early Exit:  Back to work and Lack of app utilization  Smoking History:  Social History   Tobacco Use  Smoking Status Former Smoker  Smokeless Tobacco Former User    Diagnosis:  No diagnosis found.  ADL UCSD:   Initial Exercise Prescription: Initial Exercise Prescription - 01/11/20 1500      Date of Initial Exercise RX and Referring Provider   Date  01/11/20    Referring Provider  Larae Grooms, MD    Expected Discharge Date  03/10/20      Track   Minutes  30      Prescription Details   Frequency (times per week)  4-5    Duration  Progress to 30 minutes of continuous aerobic without signs/symptoms of physical distress      Intensity   THRR 40-80% of Max Heartrate  62-123    Ratings of Perceived Exertion  11-13    Perceived Dyspnea  0-4      Progression   Progression  Continue to progress workloads to maintain intensity without signs/symptoms of physical distress.      Resistance Training   Training Prescription  Yes    Weight  --   Orange and blue band   Reps  10-15       Discharge Exercise Prescription (Final Exercise Prescription Changes):   Functional Capacity: 6 Minute Walk    Row Name 01/11/20 1027         6 Minute Walk   Phase  Initial     Distance  1406 feet     Walk Time  6 minutes     # of Rest Breaks  0     MPH  2.66     METS  3.21     RPE  11     Perceived Dyspnea   0     VO2 Peak  11.25     Symptoms  No     Resting HR  59 bpm     Resting BP  100/66     Resting Oxygen Saturation   99 %     Exercise Oxygen Saturation  during 6 min walk  99 %     Max Ex.  HR  73 bpm     Max Ex. BP  128/68     2 Minute Post BP  118/78        Psychological, QOL, Others - Outcomes: PHQ 2/9: Depression screen [facility] 2/9 01/12/2020 01/11/2020 12/15/2019 11/26/2019 11/10/2019  Decreased Interest 0 0 0 0 0  Down, Depressed, Hopeless 0 0 0 0 0  PHQ - 2 Score 0 0 0 0 0    Quality of Life: Quality of Life - 01/11/20 1517      Quality of Life   Select  Quality of Life      Quality of Life Scores   Health/Function Pre  24.7 %    Socioeconomic Pre  24.25 %    Psych/Spiritual Pre  26.71 %    Family Pre  28.3 %    GLOBAL Pre  25.51 %       Personal Goals:  Goals established at orientation with interventions provided to work toward goal. Personal Goals and Risk Factors at Admission - 01/11/20 1519      Core Components/Risk Factors/Patient Goals on Admission   Lipids  Yes    Intervention  Provide education and support for participant on nutrition & aerobic/resistive exercise along with prescribed medications to achieve LDL 70mg , HDL >40mg .    Expected Outcomes  Short Term: Participant states understanding of desired cholesterol values and is compliant with medications prescribed. Participant is following exercise prescription and nutrition guidelines.;Long Term: Cholesterol controlled with medications as prescribed, with individualized exercise RX and with personalized nutrition plan. Value goals: LDL < 70mg , HDL > 40 mg.        Personal Goals Discharge:   Exercise Goals and Review: Exercise Goals    Row Name 01/11/20 1007             Exercise Goals   Increase Physical Activity  Yes       Intervention  Provide advice, education, support and counseling about physical activity/exercise needs.;Develop an individualized exercise prescription for aerobic and resistive training based on initial evaluation findings, risk stratification, comorbidities and participant's personal goals.       Expected Outcomes  Short Term: Attend rehab on a regular basis to  increase amount of physical activity.;Long Term: Exercising regularly at least 3-5 days a week.;Long Term: Add in home exercise to make exercise part of routine and to increase amount of physical activity.       Increase Strength and Stamina  Yes       Intervention  Provide advice, education, support and counseling about physical activity/exercise needs.;Develop an individualized exercise prescription for aerobic and resistive training based on initial evaluation findings, risk stratification, comorbidities and participant's personal goals.       Expected Outcomes  Short Term: Increase workloads from initial exercise prescription for resistance, speed, and METs.;Short Term: Perform resistance training exercises routinely during rehab and add in resistance training at home;Long Term: Improve cardiorespiratory fitness, muscular endurance and strength as measured by increased METs and functional capacity (6MWT)       Able to understand and use rate of perceived exertion (RPE) scale  Yes       Intervention  Provide education and explanation on how to use RPE scale       Expected Outcomes  Short Term: Able to use RPE daily in rehab to express subjective intensity level;Long Term:  Able to use RPE to guide intensity level when exercising independently       Knowledge and understanding of Target Heart Rate Range (THRR)  Yes       Intervention  Provide education and explanation of THRR including how the numbers were predicted and where they are located for reference       Expected Outcomes  Short Term: Able to state/look up THRR;Long Term: Able to use THRR to govern intensity when exercising independently;Short Term: Able to use daily as guideline for intensity in rehab       Able to check pulse independently  Yes       Intervention  Provide education and demonstration on how to check pulse in carotid and radial arteries.;Review the importance of being able to check your own pulse for safety during independent  exercise       Expected Outcomes  Short Term: Able to explain why pulse checking is important during independent exercise;Long Term: Able to check pulse independently and accurately  Understanding of Exercise Prescription  Yes       Intervention  Provide education, explanation, and written materials on patient's individual exercise prescription       Expected Outcomes  Short Term: Able to explain program exercise prescription;Long Term: Able to explain home exercise prescription to exercise independently          Exercise Goals Re-Evaluation:   Nutrition & Weight - Outcomes: Pre Biometrics - 01/11/20 0946      Pre Biometrics   Waist Circumference  37 inches    Hip Circumference  40.5 inches    Waist to Hip Ratio  0.91 %    Triceps Skinfold  9 mm    % Body Fat  23 %    Grip Strength  41.5 kg    Flexibility  0 in    Single Leg Stand  10.43 seconds        Nutrition:   Nutrition Discharge:   Education Questionnaire Score: Knowledge Questionnaire Score - 01/11/20 1517      Knowledge Questionnaire Score   Pre Score  15/24      Successful telephone call to Mr. Conall Huffman to follow up on lack of utilization of the [employer] VCR app. Per Mr. 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 He states he is utilizing his exercise plan including resistance band training to maintain a healthy lifestyle. He had decreased his fast food intake and chooses healthier options like grilled or baked foods. He is not interested in continuing his VCR program at this time. Patient will be discharged from the VCR program per his request.

## 2020-04-18 ENCOUNTER — Other Ambulatory Visit: Payer: Self-pay

## 2020-04-18 MED ORDER — NITROGLYCERIN 0.4 MG SL SUBL: 0.4000 mg | SUBLINGUAL_TABLET | SUBLINGUAL | 3 refills | Status: DC | PRN

## 2020-07-03 NOTE — Progress Notes (Signed)
Cardiology Office Note   Date:  07/04/2020   ID:  Peter Taylor, DOB [birth year], MRN [id number]  PCP:  Wendie Agreste, MD    No chief complaint on file.  CAD  Wt Readings from Last 3 Encounters:  07/04/20 185 lb (83.9 kg)  01/12/20 185 lb (83.9 kg)  01/11/20 187 lb 2.7 oz (84.9 kg)       History of Present Illness: Peter Taylor is a 69 y.o. male  With CAD.  Cath in 11/2019 showed:  "Mid LAD lesion is 90% stenosed.  A drug-eluting stent was successfully placed using a STENT RESOLUTE ONYX 3.0X22.  Post intervention, there is a 0% residual stenosis.  The left ventricular systolic function is normal.  LV end diastolic pressure is normal.  The left ventricular ejection fraction is 55-65% by visual estimate.  There is no aortic valve stenosis.  Mild, nonobstructive disease in the RCA and circumflex.  Continue dual antiplatelet therapy along with aggressive secondary prevention. "  Works as a Administrator.  After PCI, it was noted : "Cardiac rehab may not work with his work schedule. He is going to join a gym.  He may try to stop his Imdur when the supply runs out.  If no CP, he can stay off the medicine."  He reports occasional belching.    Denies : Chest pain. Dizziness. Leg edema. Nitroglycerin use. Orthopnea. Palpitations. Paroxysmal nocturnal dyspnea. Shortness of breath. Syncope.   No sx like his angina before stent placement.  He did get his COVID shots and is awaiting a booster.    Past Medical History:  Diagnosis Date  . CAD (coronary artery disease), native coronary artery    a. Cath 12/10/19: 90% mLAD s/p DES, mild non obstructive disease in RCA and Cx  . GERD (gastroesophageal reflux disease)   . Hyperlipidemia   . Substance abuse ([place])   . Tuberculosis     Past Surgical History:  Procedure Laterality Date  . CORONARY STENT INTERVENTION N/A 12/10/2019   Procedure: CORONARY STENT INTERVENTION;  Surgeon: Jettie Booze, MD;   Location: [facility];  Service: Cardiovascular;  Laterality: N/A;  . LEFT HEART CATH AND CORONARY ANGIOGRAPHY N/A 12/10/2019   Procedure: LEFT HEART CATH AND CORONARY ANGIOGRAPHY;  Surgeon: Jettie Booze, MD;  Location: [facility];  Service: Cardiovascular;  Laterality: N/A;  . NO PAST SURGERIES       Current Outpatient Medications  Medication Sig Dispense Refill  . aspirin EC 81 MG tablet Take 1 tablet (81 mg total) by mouth daily. 90 tablet 3  . atorvastatin (LIPITOR) 20 MG tablet Take 1 tablet (20 mg total) by mouth daily. 90 tablet 3  . clopidogrel (PLAVIX) 75 MG tablet Take 1 tablet (75 mg total) by mouth daily with breakfast. 30 tablet 11  . isosorbide mononitrate (IMDUR) 30 MG 24 hr tablet Take 1 tablet (30 mg total) by mouth daily. 90 tablet 3  . nitroGLYCERIN (NITROSTAT) 0.4 MG SL tablet Place 1 tablet (0.4 mg total) under the tongue every 5 (five) minutes as needed for chest pain. 25 tablet 3  . pantoprazole (PROTONIX) 40 MG tablet Take 1 tablet (40 mg total) by mouth daily. 30 tablet 6  . phenazopyridine (PYRIDIUM) 200 MG tablet Take 1 tablet (200 mg total) by mouth 3 (three) times daily. 6 tablet 0  . tamsulosin (FLOMAX) 0.4 MG CAPS capsule Take 1 capsule (0.4 mg total) by mouth daily. 30 capsule 0  No current facility-administered medications for this visit.    Allergies:   Penicillins    Social History:  The patient  reports that he has quit smoking. He has quit using smokeless tobacco. He reports current alcohol use. He reports that he does not use drugs.   Family History:  The patient's family history includes Cancer in his brother and mother; Ovarian cancer in his sister, sister, and sister.    ROS:  Please see the history of present illness.   Otherwise, review of systems are positive for occasional headaches.   All other systems are reviewed and negative.    PHYSICAL EXAM: VS:  BP 130/82   Pulse 65   Ht 5\' 11"  (1.803 m)   Wt 185 lb (83.9  kg)   SpO2 99%   BMI 25.80 kg/m  , BMI Body mass index is 25.8 kg/m. GEN: Well nourished, well developed, in no acute distress  HEENT: normal  Neck: no JVD, carotid bruits, or masses Cardiac: RRR; no murmurs, rubs, or gallops,no edema  Respiratory:  clear to auscultation bilaterally, normal work of breathing GI: soft, nontender, nondistended, + BS MS: no deformity or atrophy  Skin: warm and dry, no rash Neuro:  Strength and sensation are intact Psych: euthymic mood, full affect    Recent Labs: 11/03/2019: NT-Pro BNP <5 11/26/2019: BNP 7.8; TSH 1.930 12/22/2019: ALT 72; BUN 12; Creatinine, Ser 1.19; Hemoglobin 13.7; Platelets 349; Potassium 3.6; Sodium 140   Lipid Panel    Component Value Date/Time   CHOL 146 01/12/2020 1209   TRIG 72 01/12/2020 1209   HDL 54 01/12/2020 1209   CHOLHDL 2.7 01/12/2020 1209   LDLCALC 78 01/12/2020 1209     Other studies Reviewed: Additional studies/ records that were reviewed today with results demonstrating: LDL 78 in 3/21.   ASSESSMENT AND PLAN:  1. CAD: s/p LAD PCI. Contiue aggressive secondary prevention.  Increase exercise to target below. 2. Hyperlipidemia: The current medical regimen is effective;  continue present plan and medications. 3. Headaches:  OK to stop Imdur and see if headaches improve.  He will also see Dr. Nyoka Cowden to see if further management is needed.  Not having angina.  THis was added before his PCI. 4. Whole food plant based diet.  Son is a Automotive engineer.  High fiber intake.   Current medicines are reviewed at length with the patient today.  The patient concerns regarding his medicines were addressed.  The following changes have been made:  No change  Labs/ tests ordered today include:  No orders of the defined types were placed in this encounter.   Recommend 150 minutes/week of aerobic exercise Low fat, low carb, high fiber diet recommended  Disposition:   FU in 6 months.  COnsider stopping aspirin at next  visit.   Signed, Larae Grooms, MD  07/04/2020 8:40 AM    [facility] [address], [place], [place]  [address] Phone: [phone]; Fax: [phone]

## 2020-07-04 ENCOUNTER — Encounter: Payer: Self-pay | Admitting: Interventional Cardiology

## 2020-07-04 ENCOUNTER — Other Ambulatory Visit: Payer: Self-pay

## 2020-07-04 ENCOUNTER — Ambulatory Visit: Payer: Medicare HMO | Admitting: Interventional Cardiology

## 2020-07-04 VITALS — BP 130/82 | HR 65 | Ht 71.0 in | Wt 185.0 lb

## 2020-07-04 DIAGNOSIS — Z95820 Peripheral vascular angioplasty status with implants and grafts: Secondary | ICD-10-CM

## 2020-07-04 DIAGNOSIS — K219 Gastro-esophageal reflux disease without esophagitis: Secondary | ICD-10-CM | POA: Diagnosis not present

## 2020-07-04 DIAGNOSIS — E782 Mixed hyperlipidemia: Secondary | ICD-10-CM

## 2020-07-04 NOTE — Patient Instructions (Signed)
Medication Instructions:  Your physician has recommended you make the following change in your medication:   STOP: isosorbide mononitrate (imdur)  *If you need a refill on your cardiac medications before your next appointment, please call your pharmacy*   Lab Work: None  If you have labs (blood work) drawn today and your tests are completely normal, you will receive your results only by: Marland Kitchen MyChart Message (if you have MyChart) OR . A paper copy in the mail If you have any lab test that is abnormal or we need to change your treatment, we will call you to review the results.   Testing/Procedures: None   Follow-Up: At [facility], you and your health needs are our priority.  As part of our continuing mission to provide you with exceptional heart care, we have created designated Provider Care Teams.  These Care Teams include your primary Cardiologist (physician) and Advanced Practice Providers (APPs -  Physician Assistants and Nurse Practitioners) who all work together to provide you with the care you need, when you need it.  We recommend signing up for the patient portal called "MyChart".  Sign up information is provided on this After Visit Summary.  MyChart is used to connect with patients for Virtual Visits (Telemedicine).  Patients are able to view lab/test results, encounter notes, upcoming appointments, etc.  Non-urgent messages can be sent to your provider as well.   To learn more about what you can do with MyChart, go to [email or web address].    Your next appointment:   6 month(s)  The format for your next appointment:   In Person  Provider:   You may see Larae Grooms, MD or one of the following Advanced Practice Providers on your designated Care Team:    Melina Copa, PA-C  Ermalinda Barrios, PA-C    Other Instructions  High-Fiber Diet Fiber, also called dietary fiber, is a type of carbohydrate that is found in fruits, vegetables, whole grains, and beans. A  high-fiber diet can have many health benefits. Your health care provider may recommend a high-fiber diet to help:  Prevent constipation. Fiber can make your bowel movements more regular.  Lower your cholesterol.  Relieve the following conditions: ? Swelling of veins in the anus (hemorrhoids). ? Swelling and irritation (inflammation) of specific areas of the digestive tract (uncomplicated diverticulosis). ? A problem of the large intestine (colon) that sometimes causes pain and diarrhea (irritable bowel syndrome, IBS).  Prevent overeating as part of a weight-loss plan.  Prevent heart disease, type 2 diabetes, and certain cancers. What is my plan? The recommended daily fiber intake in grams (g) includes:  38 g for men age 43 or younger.  30 g for men over age 55.  32 g for women age 31 or younger.  21 g for women over age 30. You can get the recommended daily intake of dietary fiber by:  Eating a variety of fruits, vegetables, grains, and beans.  Taking a fiber supplement, if it is not possible to get enough fiber through your diet. What do I need to know about a high-fiber diet?  It is better to get fiber through food sources rather than from fiber supplements. There is not a lot of research about how effective supplements are.  Always check the fiber content on the nutrition facts label of any prepackaged food. Look for foods that contain 5 g of fiber or more per serving.  Talk with a diet and nutrition specialist (dietitian) if you have questions about specific  foods that are recommended or not recommended for your medical condition, especially if those foods are not listed below.  Gradually increase how much fiber you consume. If you increase your intake of dietary fiber too quickly, you may have bloating, cramping, or gas.  Drink plenty of water. Water helps you to digest fiber. What are tips for following this plan?  Eat a wide variety of high-fiber foods.  Make sure  that half of the grains that you eat each day are whole grains.  Eat breads and cereals that are made with whole-grain flour instead of refined flour or white flour.  Eat brown rice, bulgur wheat, or millet instead of white rice.  Start the day with a breakfast that is high in fiber, such as a cereal that contains 5 g of fiber or more per serving.  Use beans in place of meat in soups, salads, and pasta dishes.  Eat high-fiber snacks, such as berries, raw vegetables, nuts, and popcorn.  Choose whole fruits and vegetables instead of processed forms like juice or sauce. What foods can I eat?  Fruits Berries. Pears. Apples. Oranges. Avocado. Prunes and raisins. Dried figs. Vegetables Sweet potatoes. Spinach. Kale. Artichokes. Cabbage. Broccoli. Cauliflower. Green peas. Carrots. Squash. Grains Whole-grain breads. Multigrain cereal. Oats and oatmeal. Brown rice. Barley. Bulgur wheat. Corinne. Quinoa. Bran muffins. Popcorn. Rye wafer crackers. Meats and other proteins Navy, kidney, and pinto beans. Soybeans. Split peas. Lentils. Nuts and seeds. Dairy Fiber-fortified yogurt. Beverages Fiber-fortified soy milk. Fiber-fortified orange juice. Other foods Fiber bars. The items listed above may not be a complete list of recommended foods and beverages. Contact a dietitian for more options. What foods are not recommended? Fruits Fruit juice. Cooked, strained fruit. Vegetables Fried potatoes. Canned vegetables. Well-cooked vegetables. Grains White bread. Pasta made with refined flour. White rice. Meats and other proteins Fatty cuts of meat. Fried chicken or fried fish. Dairy Milk. Yogurt. Cream cheese. Sour cream. Fats and oils Butters. Beverages Soft drinks. Other foods Cakes and pastries. The items listed above may not be a complete list of foods and beverages to avoid. Contact a dietitian for more information. Summary  Fiber is a type of carbohydrate. It is found in fruits,  vegetables, whole grains, and beans.  There are many health benefits of eating a high-fiber diet, such as preventing constipation, lowering blood cholesterol, helping with weight loss, and reducing your risk of heart disease, diabetes, and certain cancers.  Gradually increase your intake of fiber. Increasing too fast can result in cramping, bloating, and gas. Drink plenty of water while you increase your fiber.  The best sources of fiber include whole fruits and vegetables, whole grains, nuts, seeds, and beans. This information is not intended to replace advice given to you by your health care provider. Make sure you discuss any questions you have with your health care provider. Document Revised: 08/18/2017 Document Reviewed: 08/18/2017 Elsevier Patient Education  2020 [employer].

## 2020-07-19 ENCOUNTER — Encounter: Payer: Self-pay | Admitting: Family Medicine

## 2020-07-19 ENCOUNTER — Ambulatory Visit (INDEPENDENT_AMBULATORY_CARE_PROVIDER_SITE_OTHER): Payer: Medicare HMO | Admitting: Family Medicine

## 2020-07-19 ENCOUNTER — Other Ambulatory Visit: Payer: Self-pay

## 2020-07-19 VITALS — BP 123/79 | HR 74 | Temp 98.3°F | Ht 71.0 in | Wt 186.0 lb

## 2020-07-19 DIAGNOSIS — I25810 Atherosclerosis of coronary artery bypass graft(s) without angina pectoris: Secondary | ICD-10-CM | POA: Diagnosis not present

## 2020-07-19 DIAGNOSIS — R519 Headache, unspecified: Secondary | ICD-10-CM | POA: Diagnosis not present

## 2020-07-19 DIAGNOSIS — K219 Gastro-esophageal reflux disease without esophagitis: Secondary | ICD-10-CM | POA: Diagnosis not present

## 2020-07-19 DIAGNOSIS — E785 Hyperlipidemia, unspecified: Secondary | ICD-10-CM | POA: Diagnosis not present

## 2020-07-19 DIAGNOSIS — R079 Chest pain, unspecified: Secondary | ICD-10-CM | POA: Diagnosis not present

## 2020-07-19 LAB — COMPREHENSIVE METABOLIC PANEL
ALT: 37 IU/L (ref 0–44)
AST: 30 IU/L (ref 0–40)
Albumin/Globulin Ratio: 1.8 (ref 1.2–2.2)
Albumin: 4.4 g/dL (ref 3.8–4.8)
Alkaline Phosphatase: 151 IU/L — ABNORMAL HIGH (ref 44–121)
BUN/Creatinine Ratio: 13 (ref 10–24)
BUN: 15 mg/dL (ref 8–27)
Bilirubin Total: 0.5 mg/dL (ref 0.0–1.2)
CO2: 24 mmol/L (ref 20–29)
Calcium: 9.3 mg/dL (ref 8.6–10.2)
Chloride: 103 mmol/L (ref 96–106)
Creatinine, Ser: 1.2 mg/dL (ref 0.76–1.27)
GFR calc Af Amer: 72 mL/min/{1.73_m2} (ref >59)
GFR calc non Af Amer: 62 mL/min/{1.73_m2} (ref >59)
Globulin, Total: 2.5 g/dL (ref 1.5–4.5)
Glucose: 116 mg/dL — ABNORMAL HIGH (ref 65–99)
Potassium: 4 mmol/L (ref 3.5–5.2)
Sodium: 141 mmol/L (ref 134–144)
Total Protein: 6.9 g/dL (ref 6.0–8.5)

## 2020-07-19 LAB — LIPID PANEL
Chol/HDL Ratio: 2.6 ratio (ref 0.0–5.0)
Cholesterol, Total: 153 mg/dL (ref 100–199)
HDL: 59 mg/dL (ref >39)
LDL Chol Calc (NIH): 82 mg/dL (ref 0–99)
Triglycerides: 60 mg/dL (ref 0–149)
VLDL Cholesterol Cal: 12 mg/dL (ref 5–40)

## 2020-07-19 MED ORDER — PANTOPRAZOLE SODIUM 40 MG PO TBEC: 40.0000 mg | DELAYED_RELEASE_TABLET | Freq: Every day | ORAL | 6 refills | Status: DC

## 2020-07-19 NOTE — Progress Notes (Signed)
Subjective:  Patient ID: Peter Taylor, male    DOB: 04-05-1951  Age: 69 y.o. MRN: [id number]  CC:  Chief Complaint  Patient presents with  . Follow-up    on hyperlipidemia and Gastroesophageal reflux. PT reports no issues with these conditions. pt states he did have a little reflux some times after eatting, but stats once he belched the discomfort went away. PT reorts no physical symptoms of these conditions other than a headache every now and then.    HPI Peter Taylor presents for  Hyperlipidemia: With history of coronary artery disease, cardiologist Dr. Irish Lack.  Status post drug-eluting stent for mid LAD lesion in February.  Dual antiplatelet therapy.  Continued on Lipitor 20 mg daily for hyperlipidemia, overall stable in March.  No new side effects. appt in 07/04/20 with cardiology. Off isosorbide.  Lab Results  Component Value Date   CHOL 146 01/12/2020   HDL 54 01/12/2020   LDLCALC 78 01/12/2020   TRIG 72 01/12/2020   CHOLHDL 2.7 01/12/2020   Lab Results  Component Value Date   ALT 72 (H) 12/22/2019   AST 45 (H) 12/22/2019   ALKPHOS 157 (H) 12/22/2019   BILITOT 0.4 12/22/2019   GERD Protonix 40 mg daily, working well at March visit.  Endoscopy in February was normal. Rare soreness improves with belching.  Certain foods cause heartburn - some each day. . Still on protonix daily.   No soda, avoiding caffeine.   Headache: Episodic, wakes up with HA every other week past few months, none in past 3-4 days. No new weakness, facial droop, diplopia/vision change or speech difficulty. Lasts about a minute and resolves. No alcohol. Skips evening meals at times - has had HA in past without eating.    History Patient Active Problem List   Diagnosis Date Noted  . Coronary artery disease   . Hyperlipidemia 11/11/2019   Past Medical History:  Diagnosis Date  . CAD (coronary artery disease), native coronary artery    a. Cath 12/10/19: 90% mLAD s/p DES, mild non obstructive  disease in RCA and Cx  . GERD (gastroesophageal reflux disease)   . Hyperlipidemia   . Substance abuse ([place])   . Tuberculosis    Past Surgical History:  Procedure Laterality Date  . CORONARY STENT INTERVENTION N/A 12/10/2019   Procedure: CORONARY STENT INTERVENTION;  Surgeon: Jettie Booze, MD;  Location: [facility];  Service: Cardiovascular;  Laterality: N/A;  . LEFT HEART CATH AND CORONARY ANGIOGRAPHY N/A 12/10/2019   Procedure: LEFT HEART CATH AND CORONARY ANGIOGRAPHY;  Surgeon: Jettie Booze, MD;  Location: [facility];  Service: Cardiovascular;  Laterality: N/A;  . NO PAST SURGERIES     Allergies  Allergen Reactions  . Penicillins Hives    Did it involve swelling of the face/tongue/throat, SOB, or low BP? No Did it involve sudden or severe rash/hives, skin peeling, or any reaction on the inside of your mouth or nose? No Did you need to seek medical attention at a hospital or doctor's office? No When did it last happen?35+ years If all above answers are "NO", may proceed with cephalosporin use.    Prior to Admission medications   Medication Sig Start Date End Date Taking? Authorizing Provider  aspirin EC 81 MG tablet Take 1 tablet (81 mg total) by mouth daily. 11/11/19  Yes Jettie Booze, MD  atorvastatin (LIPITOR) 20 MG tablet Take 1 tablet (20 mg total) by mouth daily. 11/11/19  Yes Irish Lack,  Charlann Lange, MD  clopidogrel (PLAVIX) 75 MG tablet Take 1 tablet (75 mg total) by mouth daily with breakfast. 12/11/19  Yes Bhagat, Bhavinkumar, PA  nitroGLYCERIN (NITROSTAT) 0.4 MG SL tablet Place 1 tablet (0.4 mg total) under the tongue every 5 (five) minutes as needed for chest pain. 04/18/20  Yes Jettie Booze, MD  pantoprazole (PROTONIX) 40 MG tablet Take 1 tablet (40 mg total) by mouth daily. 12/10/19 12/09/20 Yes Bhagat, Bhavinkumar, PA  tamsulosin (FLOMAX) 0.4 MG CAPS capsule Take 1 capsule (0.4 mg total) by mouth daily. 12/22/19  Yes Sherwood Gambler, MD   Social History   Socioeconomic History  . Marital status: Single    Spouse name: Not on file  . Number of children: 8  . Years of education: Not on file  . Highest education level: Not on file  Occupational History  . Occupation: truck Geophysicist/field seismologist  Tobacco Use  . Smoking status: Former Research scientist (life sciences)  . Smokeless tobacco: Former Network engineer  . Vaping Use: Never used  Substance and Sexual Activity  . Alcohol use: Yes    Comment: occ  . Drug use: No  . Sexual activity: Not on file  Other Topics Concern  . Not on file  Social History Narrative  . Not on file   Social Determinants of Health   Financial Resource Strain: Low Risk   . Difficulty of Paying Living Expenses: Not hard at all  Food Insecurity: No Food Insecurity  . Worried About Charity fundraiser in the Last Year: Never true  . Ran Out of Food in the Last Year: Never true  Transportation Needs: No Transportation Needs  . Lack of Transportation (Medical): No  . Lack of Transportation (Non-Medical): No  Physical Activity:   . Days of Exercise per Week: Not on file  . Minutes of Exercise per Session: Not on file  Stress: No Stress Concern Present  . Feeling of Stress : Not at all  Social Connections: Socially Isolated  . Frequency of Communication with Friends and Family: Once a week  . Frequency of Social Gatherings with Friends and Family: Once a week  . Attends Religious Services: More than 4 times per year  . Active Member of Clubs or Organizations: No  . Attends Archivist Meetings: Never  . Marital Status: Divorced  Human resources officer Violence:   . Fear of Current or Ex-Partner: Not on file  . Emotionally Abused: Not on file  . Physically Abused: Not on file  . Sexually Abused: Not on file    Review of Systems  Constitutional: Negative for fatigue and unexpected weight change.  Eyes: Negative for visual disturbance.  Respiratory: Negative for cough, chest tightness and shortness of  breath.   Cardiovascular: Negative for chest pain (resolves with belching.), palpitations and leg swelling.  Gastrointestinal: Negative for abdominal pain and blood in stool.  Neurological: Positive for headaches. Negative for dizziness and light-headedness.     Objective:   Vitals:   07/19/20 0912  BP: 123/79  Pulse: 74  Temp: 98.3 F (36.8 C)  TempSrc: Temporal  SpO2: 100%  Weight: 186 lb (84.4 kg)  Height: 5\' 11"  (1.803 m)     Physical Exam Vitals reviewed.  Constitutional:      Appearance: He is well-developed.  HENT:     Head: Normocephalic and atraumatic.  Eyes:     Pupils: Pupils are equal, round, and reactive to light.  Neck:     Vascular: No carotid bruit  or JVD.  Cardiovascular:     Rate and Rhythm: Normal rate and regular rhythm.     Heart sounds: Normal heart sounds. No murmur heard.   Pulmonary:     Effort: Pulmonary effort is normal.     Breath sounds: Normal breath sounds. No rales.  Skin:    General: Skin is warm and dry.  Neurological:     General: No focal deficit present.     Mental Status: He is alert and oriented to person, place, and time.     Cranial Nerves: No cranial nerve deficit.     Sensory: No sensory deficit.     Motor: No weakness.     Coordination: Coordination normal.     Gait: Gait normal.  Psychiatric:        Mood and Affect: Mood normal.        Behavior: Behavior normal.        Assessment & Plan:  Peter Taylor is a 69 y.o. male . Hyperlipidemia, unspecified hyperlipidemia type - Plan: Lipid panel, Comprehensive metabolic panel  -Continue statin same dose for now, denies any new side effects.  Check labs.  Gastroesophageal reflux disease without esophagitis  -Still some breakthrough symptoms, continue Protonix same dose, trial of dietary adjustment, handout given on trigger foods, follow-up with gastroenterology if persistent frequent symptoms.  Nonintractable episodic headache, unspecified headache  type  -Infrequent symptoms, nonfocal neuro exam.  Now off isosorbide.  -  Increase fluid intake during the day, regular meals discussed as skipping meals in the past has caused headache.  RTC precautions if persistent or worsening.  ER precautions.  Coronary artery disease involving coronary bypass graft of native heart without angina pectoris.  Chest pain, unspecified type  -Rare chest symptoms that improved with belching, likely reflux related.  Continued on dual antiplatelet therapy, recent cardiology eval.  35-month recheck.  Meds ordered this encounter  Medications  . pantoprazole (PROTONIX) 40 MG tablet    Sig: Take 1 tablet (40 mg total) by mouth daily.    Dispense:  30 tablet    Refill:  6   Patient Instructions    See list of foods that I have provided below to make sure you are not taking noticed each day as that can worsen your heartburn.  Continue Protonix once per day.  If you continue to have breakthrough heartburn symptoms I would recommend follow-up with your gastroenterologist.  No other change in medications for now and I will check some labs today.  Increase fluid intake during the day, regular meals - do not skip meals and follow up in next few weeks if headaches not improving.   Return to the clinic or go to the nearest emergency room if any of your symptoms worsen or new symptoms occur.   General Headache Without Cause A headache is pain or discomfort felt around the head or neck area. The specific cause of a headache may not be found. There are many causes and types of headaches. A few common ones are:  Tension headaches.  Migraine headaches.  Cluster headaches.  Chronic daily headaches. Follow these instructions at home: Watch your condition for any changes. Let your health care provider know about them. Take these steps to help with your condition: Managing pain      Take over-the-counter and prescription medicines only as told by your health care  provider.  Lie down in a dark, quiet room when you have a headache.  If directed, put ice on your  head and neck area: ? Put ice in a plastic bag. ? Place a towel between your skin and the bag. ? Leave the ice on for 20 minutes, 2-3 times per day.  If directed, apply heat to the affected area. Use the heat source that your health care provider recommends, such as a moist heat pack or a heating pad. ? Place a towel between your skin and the heat source. ? Leave the heat on for 20-30 minutes. ? Remove the heat if your skin turns bright red. This is especially important if you are unable to feel pain, heat, or cold. You may have a greater risk of getting burned.  Keep lights dim if bright lights bother you or make your headaches worse. Eating and drinking  Eat meals on a regular schedule.  If you drink alcohol: ? Limit how much you use to:  0-1 drink a day for women.  0-2 drinks a day for men. ? Be aware of how much alcohol is in your drink. In the [place] one drink equals one 12 oz bottle of beer (355 mL), one 5 oz glass of wine (148 mL), or one 1 oz glass of hard liquor (44 mL).  Stop drinking caffeine, or decrease the amount of caffeine you drink. General instructions   Keep a headache journal to help find out what may trigger your headaches. For example, write down: ? What you eat and drink. ? How much sleep you get. ? Any change to your diet or medicines.  Try massage or other relaxation techniques.  Limit stress.  Sit up straight, and do not tense your muscles.  Do not use any products that contain nicotine or tobacco, such as cigarettes, e-cigarettes, and chewing tobacco. If you need help quitting, ask your health care provider.  Exercise regularly as told by your health care provider.  Sleep on a regular schedule. Get 7-9 hours of sleep each night, or the amount recommended by your health care provider.  Keep all follow-up visits as told by your health care  provider. This is important. Contact a health care provider if:  Your symptoms are not helped by medicine.  You have a headache that is different from the usual headache.  You have nausea or you vomit.  You have a fever. Get help right away if:  Your headache becomes severe quickly.  Your headache gets worse after moderate to intense physical activity.  You have repeated vomiting.  You have a stiff neck.  You have a loss of vision.  You have problems with speech.  You have pain in the eye or ear.  You have muscular weakness or loss of muscle control.  You lose your balance or have trouble walking.  You feel faint or pass out.  You have confusion.  You have a seizure. Summary  A headache is pain or discomfort felt around the head or neck area.  There are many causes and types of headaches. In some cases, the cause may not be found.  Keep a headache journal to help find out what may trigger your headaches. Watch your condition for any changes. Let your health care provider know about them.  Contact a health care provider if you have a headache that is different from the usual headache, or if your symptoms are not helped by medicine.  Get help right away if your headache becomes severe, you vomit, you have a loss of vision, you lose your balance, or you have a seizure. This  information is not intended to replace advice given to you by your health care provider. Make sure you discuss any questions you have with your health care provider. Document Revised: 05/04/2018 Document Reviewed: 05/04/2018 Elsevier Patient Education  2020 [place] for Gastroesophageal Reflux Disease, Adult When you have gastroesophageal reflux disease (GERD), the foods you eat and your eating habits are very important. Choosing the right foods can help ease the discomfort of GERD. Consider working with a diet and nutrition specialist (dietitian) to help you make healthy food  choices. What general guidelines should I follow?  Eating plan  Choose healthy foods low in fat, such as fruits, vegetables, whole grains, low-fat dairy products, and lean meat, fish, and poultry.  Eat frequent, small meals instead of three large meals each day. Eat your meals slowly, in a relaxed setting. Avoid bending over or lying down until 2-3 hours after eating.  Limit high-fat foods such as fatty meats or fried foods.  Limit your intake of oils, butter, and shortening to less than 8 teaspoons each day.  Avoid the following: ? Foods that cause symptoms. These may be different for different people. Keep a food diary to keep track of foods that cause symptoms. ? Alcohol. ? Drinking large amounts of liquid with meals. ? Eating meals during the 2-3 hours before bed.  Cook foods using methods other than frying. This may include baking, grilling, or broiling. Lifestyle  Maintain a healthy weight. Ask your health care provider what weight is healthy for you. If you need to lose weight, work with your health care provider to do so safely.  Exercise for at least 30 minutes on 5 or more days each week, or as told by your health care provider.  Avoid wearing clothes that fit tightly around your waist and chest.  Do not use any products that contain nicotine or tobacco, such as cigarettes and e-cigarettes. If you need help quitting, ask your health care provider.  Sleep with the head of your bed raised. Use a wedge under the mattress or blocks under the bed frame to raise the head of the bed. What foods are not recommended? The items listed may not be a complete list. Talk with your dietitian about what dietary choices are best for you. Grains Pastries or quick breads with added fat. [place] toast. Vegetables Deep fried vegetables. [place] fries. Any vegetables prepared with added fat. Any vegetables that cause symptoms. For some people this may include tomatoes and tomato products, chili  peppers, onions and garlic, and horseradish. Fruits Any fruits prepared with added fat. Any fruits that cause symptoms. For some people this may include citrus fruits, such as oranges, grapefruit, pineapple, and lemons. Meats and other protein foods High-fat meats, such as fatty beef or pork, hot dogs, ribs, ham, sausage, salami and bacon. Fried meat or protein, including fried fish and fried chicken. Nuts and nut butters. Dairy Whole milk and chocolate milk. Sour cream. Cream. Ice cream. Cream cheese. Milk shakes. Beverages Coffee and tea, with or without caffeine. Carbonated beverages. Sodas. Energy drinks. Fruit juice made with acidic fruits (such as orange or grapefruit). Tomato juice. Alcoholic drinks. Fats and oils Butter. Margarine. Shortening. Ghee. Sweets and desserts Chocolate and cocoa. Donuts. Seasoning and other foods Pepper. Peppermint and spearmint. Any condiments, herbs, or seasonings that cause symptoms. For some people, this may include curry, hot sauce, or vinegar-based salad dressings. Summary  When you have gastroesophageal reflux disease (GERD), food and lifestyle choices  are very important to help ease the discomfort of GERD.  Eat frequent, small meals instead of three large meals each day. Eat your meals slowly, in a relaxed setting. Avoid bending over or lying down until 2-3 hours after eating.  Limit high-fat foods such as fatty meat or fried foods. This information is not intended to replace advice given to you by your health care provider. Make sure you discuss any questions you have with your health care provider. Document Revised: 02/04/2019 Document Reviewed: 10/15/2016 Elsevier Patient Education  [employer].    If you have lab work done today you will be contacted with your lab results within the next 2 weeks.  If you have not heard from [place] then please contact us. The fastest way to get your results is to register for My Chart.   IF you received  an x-ray today, you will receive an invoice from [facility]. Please contact [facility] at [phone] with questions or concerns regarding your invoice.   IF you received labwork today, you will receive an invoice from [facility]. Please contact [facility] at [phone] with questions or concerns regarding your invoice.   Our billing staff will not be able to assist you with questions regarding bills from these companies.  You will be contacted with the lab results as soon as they are available. The fastest way to get your results is to activate your My Chart account. Instructions are located on the last page of this paperwork. If you have not heard from [place] regarding the results in 2 weeks, please contact this office.         Signed, Merri Ray, MD [facility]

## 2020-07-19 NOTE — Patient Instructions (Addendum)
See list of foods that I have provided below to make sure you are not taking noticed each day as that can worsen your heartburn.  Continue Protonix once per day.  If you continue to have breakthrough heartburn symptoms I would recommend follow-up with your gastroenterologist.  No other change in medications for now and I will check some labs today.  Increase fluid intake during the day, regular meals - do not skip meals and follow up in next few weeks if headaches not improving.   Return to the clinic or go to the nearest emergency room if any of your symptoms worsen or new symptoms occur.   General Headache Without Cause A headache is pain or discomfort felt around the head or neck area. The specific cause of a headache may not be found. There are many causes and types of headaches. A few common ones are:  Tension headaches.  Migraine headaches.  Cluster headaches.  Chronic daily headaches. Follow these instructions at home: Watch your condition for any changes. Let your health care provider know about them. Take these steps to help with your condition: Managing pain      Take over-the-counter and prescription medicines only as told by your health care provider.  Lie down in a dark, quiet room when you have a headache.  If directed, put ice on your head and neck area: ? Put ice in a plastic bag. ? Place a towel between your skin and the bag. ? Leave the ice on for 20 minutes, 2-3 times per day.  If directed, apply heat to the affected area. Use the heat source that your health care provider recommends, such as a moist heat pack or a heating pad. ? Place a towel between your skin and the heat source. ? Leave the heat on for 20-30 minutes. ? Remove the heat if your skin turns bright red. This is especially important if you are unable to feel pain, heat, or cold. You may have a greater risk of getting burned.  Keep lights dim if bright lights bother you or make your headaches  worse. Eating and drinking  Eat meals on a regular schedule.  If you drink alcohol: ? Limit how much you use to:  0-1 drink a day for women.  0-2 drinks a day for men. ? Be aware of how much alcohol is in your drink. In the [place] one drink equals one 12 oz bottle of beer (355 mL), one 5 oz glass of wine (148 mL), or one 1 oz glass of hard liquor (44 mL).  Stop drinking caffeine, or decrease the amount of caffeine you drink. General instructions   Keep a headache journal to help find out what may trigger your headaches. For example, write down: ? What you eat and drink. ? How much sleep you get. ? Any change to your diet or medicines.  Try massage or other relaxation techniques.  Limit stress.  Sit up straight, and do not tense your muscles.  Do not use any products that contain nicotine or tobacco, such as cigarettes, e-cigarettes, and chewing tobacco. If you need help quitting, ask your health care provider.  Exercise regularly as told by your health care provider.  Sleep on a regular schedule. Get 7-9 hours of sleep each night, or the amount recommended by your health care provider.  Keep all follow-up visits as told by your health care provider. This is important. Contact a health care provider if:  Your symptoms are not helped  by medicine.  You have a headache that is different from the usual headache.  You have nausea or you vomit.  You have a fever. Get help right away if:  Your headache becomes severe quickly.  Your headache gets worse after moderate to intense physical activity.  You have repeated vomiting.  You have a stiff neck.  You have a loss of vision.  You have problems with speech.  You have pain in the eye or ear.  You have muscular weakness or loss of muscle control.  You lose your balance or have trouble walking.  You feel faint or pass out.  You have confusion.  You have a seizure. Summary  A headache is pain or discomfort  felt around the head or neck area.  There are many causes and types of headaches. In some cases, the cause may not be found.  Keep a headache journal to help find out what may trigger your headaches. Watch your condition for any changes. Let your health care provider know about them.  Contact a health care provider if you have a headache that is different from the usual headache, or if your symptoms are not helped by medicine.  Get help right away if your headache becomes severe, you vomit, you have a loss of vision, you lose your balance, or you have a seizure. This information is not intended to replace advice given to you by your health care provider. Make sure you discuss any questions you have with your health care provider. Document Revised: 05/04/2018 Document Reviewed: 05/04/2018 Elsevier Patient Education  2020 [place] for Gastroesophageal Reflux Disease, Adult When you have gastroesophageal reflux disease (GERD), the foods you eat and your eating habits are very important. Choosing the right foods can help ease the discomfort of GERD. Consider working with a diet and nutrition specialist (dietitian) to help you make healthy food choices. What general guidelines should I follow?  Eating plan  Choose healthy foods low in fat, such as fruits, vegetables, whole grains, low-fat dairy products, and lean meat, fish, and poultry.  Eat frequent, small meals instead of three large meals each day. Eat your meals slowly, in a relaxed setting. Avoid bending over or lying down until 2-3 hours after eating.  Limit high-fat foods such as fatty meats or fried foods.  Limit your intake of oils, butter, and shortening to less than 8 teaspoons each day.  Avoid the following: ? Foods that cause symptoms. These may be different for different people. Keep a food diary to keep track of foods that cause symptoms. ? Alcohol. ? Drinking large amounts of liquid with  meals. ? Eating meals during the 2-3 hours before bed.  Cook foods using methods other than frying. This may include baking, grilling, or broiling. Lifestyle  Maintain a healthy weight. Ask your health care provider what weight is healthy for you. If you need to lose weight, work with your health care provider to do so safely.  Exercise for at least 30 minutes on 5 or more days each week, or as told by your health care provider.  Avoid wearing clothes that fit tightly around your waist and chest.  Do not use any products that contain nicotine or tobacco, such as cigarettes and e-cigarettes. If you need help quitting, ask your health care provider.  Sleep with the head of your bed raised. Use a wedge under the mattress or blocks under the bed frame to raise the head of the  bed. What foods are not recommended? The items listed may not be a complete list. Talk with your dietitian about what dietary choices are best for you. Grains Pastries or quick breads with added fat. [place] toast. Vegetables Deep fried vegetables. [place] fries. Any vegetables prepared with added fat. Any vegetables that cause symptoms. For some people this may include tomatoes and tomato products, chili peppers, onions and garlic, and horseradish. Fruits Any fruits prepared with added fat. Any fruits that cause symptoms. For some people this may include citrus fruits, such as oranges, grapefruit, pineapple, and lemons. Meats and other protein foods High-fat meats, such as fatty beef or pork, hot dogs, ribs, ham, sausage, salami and bacon. Fried meat or protein, including fried fish and fried chicken. Nuts and nut butters. Dairy Whole milk and chocolate milk. Sour cream. Cream. Ice cream. Cream cheese. Milk shakes. Beverages Coffee and tea, with or without caffeine. Carbonated beverages. Sodas. Energy drinks. Fruit juice made with acidic fruits (such as orange or grapefruit). Tomato juice. Alcoholic drinks. Fats and  oils Butter. Margarine. Shortening. Ghee. Sweets and desserts Chocolate and cocoa. Donuts. Seasoning and other foods Pepper. Peppermint and spearmint. Any condiments, herbs, or seasonings that cause symptoms. For some people, this may include curry, hot sauce, or vinegar-based salad dressings. Summary  When you have gastroesophageal reflux disease (GERD), food and lifestyle choices are very important to help ease the discomfort of GERD.  Eat frequent, small meals instead of three large meals each day. Eat your meals slowly, in a relaxed setting. Avoid bending over or lying down until 2-3 hours after eating.  Limit high-fat foods such as fatty meat or fried foods. This information is not intended to replace advice given to you by your health care provider. Make sure you discuss any questions you have with your health care provider. Document Revised: 02/04/2019 Document Reviewed: 10/15/2016 Elsevier Patient Education  [employer].    If you have lab work done today you will be contacted with your lab results within the next 2 weeks.  If you have not heard from [place] then please contact us. The fastest way to get your results is to register for My Chart.   IF you received an x-ray today, you will receive an invoice from [facility]. Please contact [facility] at [phone] with questions or concerns regarding your invoice.   IF you received labwork today, you will receive an invoice from [facility]. Please contact [facility] at [phone] with questions or concerns regarding your invoice.   Our billing staff will not be able to assist you with questions regarding bills from these companies.  You will be contacted with the lab results as soon as they are available. The fastest way to get your results is to activate your My Chart account. Instructions are located on the last page of this paperwork. If you have not heard from [place] regarding the results in 2 weeks, please  contact this office.

## 2020-07-27 ENCOUNTER — Encounter: Payer: Self-pay | Admitting: Radiology

## 2020-10-10 ENCOUNTER — Other Ambulatory Visit: Payer: Self-pay | Admitting: Family Medicine

## 2020-10-10 MED ORDER — CLOPIDOGREL BISULFATE 75 MG PO TABS: 75.0000 mg | ORAL_TABLET | Freq: Every day | ORAL | 3 refills | Status: DC

## 2020-10-10 NOTE — Telephone Encounter (Signed)
Medication Refill - Medication: clopidogrel (PLAVIX) 75 MG tablet    Has the patient contacted their pharmacy? yes (Agent: If no, request that the patient contact the pharmacy for the refill.) (Agent: If yes, when and what did the pharmacy advise?)Contact PCP  Preferred Pharmacy (with phone number or street name):  [facility] [place], [place] - [address] Phone:  [phone]  Fax:  [phone]       Agent: Please be advised that RX refills may take up to 3 business days. We ask that you follow-up with your pharmacy.

## 2020-10-10 NOTE — Telephone Encounter (Signed)
°  Notes to clinic:  medication filled by a different provider  review for refill   Requested Prescriptions  Pending Prescriptions Disp Refills   clopidogrel (PLAVIX) 75 MG tablet 30 tablet 3    Sig: Take 1 tablet (75 mg total) by mouth daily with breakfast.      Hematology: Antiplatelets - clopidogrel Failed - 10/10/2020  9:07 AM      Failed - Evaluate AST, ALT within 2 months of therapy initiation.      Failed - HCT in normal range and within 180 days    HCT  Date Value Ref Range Status  12/22/2019 42.4 39.0 - 52.0 % Final   Hematocrit  Date Value Ref Range Status  11/26/2019 44.4 37.5 - 51.0 % Final          Failed - HGB in normal range and within 180 days    Hemoglobin  Date Value Ref Range Status  12/22/2019 13.7 13.0 - 17.0 g/dL Final  11/26/2019 14.8 13.0 - 17.7 g/dL Final          Failed - PLT in normal range and within 180 days    Platelets  Date Value Ref Range Status  12/22/2019 349 150 - 400 K/uL Final  11/26/2019 273 150 - 450 x10E3/uL Final          Passed - ALT in normal range and within 360 days    ALT  Date Value Ref Range Status  07/19/2020 37 0 - 44 IU/L Final          Passed - AST in normal range and within 360 days    AST  Date Value Ref Range Status  07/19/2020 30 0 - 40 IU/L Final          Passed - Valid encounter within last 6 months    Recent Outpatient Visits           2 months ago Hyperlipidemia, unspecified hyperlipidemia type   [facility], Ranell Patrick, MD   9 months ago Hyperlipidemia, unspecified hyperlipidemia type   [facility], Ranell Patrick, MD   10 months ago Coronary artery disease involving coronary bypass graft of native heart without angina pectoris   [facility], Ranell Patrick, MD   10 months ago Screening for endocrine, metabolic and immunity disorder   [facility], Delfino Lovett, NP   11 months ago Generalized abdominal pain   [facility], Ranell Patrick, MD       Future Appointments             In 3 months Peter Taylor Ranell Patrick, MD [facility] [place], [facility]

## 2020-10-10 NOTE — Telephone Encounter (Signed)
Refilled medication for 30 days.

## 2020-11-29 ENCOUNTER — Other Ambulatory Visit: Payer: Self-pay | Admitting: Interventional Cardiology

## 2020-11-29 ENCOUNTER — Other Ambulatory Visit: Payer: Self-pay | Admitting: Family Medicine

## 2021-01-12 ENCOUNTER — Other Ambulatory Visit: Payer: Self-pay | Admitting: Family Medicine

## 2021-01-12 NOTE — Telephone Encounter (Signed)
Requested medication (s) are due for refill today:   Yes  Requested medication (s) are on the active medication list:   Yes  Future visit scheduled:   Yes   Last ordered: 10/10/2020 #30, 3 refills  Clinic note:  Unable to refill per protocol, labs due   Requested Prescriptions  Pending Prescriptions Disp Refills   clopidogrel (PLAVIX) 75 MG tablet [Pharmacy Med Name: CLOPIDOGREL 75MG  TABLETS] 30 tablet 3    Sig: TAKE 1 TABLET(75 MG) BY MOUTH DAILY WITH BREAKFAST      Hematology: Antiplatelets - clopidogrel Failed - 01/12/2021  9:48 AM      Failed - Evaluate AST, ALT within 2 months of therapy initiation.      Failed - HCT in normal range and within 180 days    HCT  Date Value Ref Range Status  12/22/2019 42.4 39.0 - 52.0 % Final   Hematocrit  Date Value Ref Range Status  11/26/2019 44.4 37.5 - 51.0 % Final          Failed - HGB in normal range and within 180 days    Hemoglobin  Date Value Ref Range Status  12/22/2019 13.7 13.0 - 17.0 g/dL Final  11/26/2019 14.8 13.0 - 17.7 g/dL Final          Failed - PLT in normal range and within 180 days    Platelets  Date Value Ref Range Status  12/22/2019 349 150 - 400 K/uL Final  11/26/2019 273 150 - 450 x10E3/uL Final          Passed - ALT in normal range and within 360 days    ALT  Date Value Ref Range Status  07/19/2020 37 0 - 44 IU/L Final          Passed - AST in normal range and within 360 days    AST  Date Value Ref Range Status  07/19/2020 30 0 - 40 IU/L Final          Passed - Valid encounter within last 6 months    Recent Outpatient Visits           5 months ago Hyperlipidemia, unspecified hyperlipidemia type   [facility], Ranell Patrick, MD   1 year ago Hyperlipidemia, unspecified hyperlipidemia type   [facility], Ranell Patrick, MD   1 year ago Coronary artery disease involving coronary bypass graft of native heart without angina pectoris   [facility], Ranell Patrick, MD   1 year ago Screening for endocrine, metabolic and immunity disorder   [facility], Delfino Lovett, NP   1 year ago Generalized abdominal pain   [facility], Ranell Patrick, MD       Future Appointments             In 5 days Wendie Agreste, MD [facility] [place], [facility]   In 1 week Jettie Booze, MD [place], [facility]

## 2021-01-13 DIAGNOSIS — N400 Enlarged prostate without lower urinary tract symptoms: Secondary | ICD-10-CM | POA: Diagnosis not present

## 2021-01-13 DIAGNOSIS — R69 Illness, unspecified: Secondary | ICD-10-CM | POA: Diagnosis not present

## 2021-01-13 DIAGNOSIS — I25119 Atherosclerotic heart disease of native coronary artery with unspecified angina pectoris: Secondary | ICD-10-CM | POA: Diagnosis not present

## 2021-01-13 DIAGNOSIS — E785 Hyperlipidemia, unspecified: Secondary | ICD-10-CM | POA: Diagnosis not present

## 2021-01-13 DIAGNOSIS — N529 Male erectile dysfunction, unspecified: Secondary | ICD-10-CM | POA: Diagnosis not present

## 2021-01-13 DIAGNOSIS — Z7722 Contact with and (suspected) exposure to environmental tobacco smoke (acute) (chronic): Secondary | ICD-10-CM | POA: Diagnosis not present

## 2021-01-13 DIAGNOSIS — K219 Gastro-esophageal reflux disease without esophagitis: Secondary | ICD-10-CM | POA: Diagnosis not present

## 2021-01-13 DIAGNOSIS — R030 Elevated blood-pressure reading, without diagnosis of hypertension: Secondary | ICD-10-CM | POA: Diagnosis not present

## 2021-01-13 DIAGNOSIS — R32 Unspecified urinary incontinence: Secondary | ICD-10-CM | POA: Diagnosis not present

## 2021-01-17 ENCOUNTER — Ambulatory Visit: Payer: Medicare HMO | Admitting: Family Medicine

## 2021-01-17 ENCOUNTER — Encounter: Payer: Self-pay | Admitting: Family Medicine

## 2021-01-17 ENCOUNTER — Other Ambulatory Visit: Payer: Self-pay

## 2021-01-17 VITALS — BP 129/80 | HR 80 | Temp 97.6°F | Ht 71.0 in | Wt 187.0 lb

## 2021-01-17 DIAGNOSIS — R739 Hyperglycemia, unspecified: Secondary | ICD-10-CM

## 2021-01-17 DIAGNOSIS — E785 Hyperlipidemia, unspecified: Secondary | ICD-10-CM | POA: Diagnosis not present

## 2021-01-17 DIAGNOSIS — R310 Gross hematuria: Secondary | ICD-10-CM | POA: Diagnosis not present

## 2021-01-17 DIAGNOSIS — I25810 Atherosclerosis of coronary artery bypass graft(s) without angina pectoris: Secondary | ICD-10-CM

## 2021-01-17 DIAGNOSIS — Z1159 Encounter for screening for other viral diseases: Secondary | ICD-10-CM | POA: Diagnosis not present

## 2021-01-17 DIAGNOSIS — K219 Gastro-esophageal reflux disease without esophagitis: Secondary | ICD-10-CM

## 2021-01-17 LAB — POC MICROSCOPIC URINALYSIS (UMFC): Mucus: ABSENT

## 2021-01-17 LAB — POCT URINALYSIS DIP (MANUAL ENTRY)
Bilirubin, UA: NEGATIVE
Glucose, UA: NEGATIVE mg/dL
Ketones, POC UA: NEGATIVE mg/dL
Nitrite, UA: POSITIVE — AB
Protein Ur, POC: NEGATIVE mg/dL
Spec Grav, UA: 1.02 (ref 1.010–1.025)
Urobilinogen, UA: 0.2 E.U./dL
pH, UA: 5.5 (ref 5.0–8.0)

## 2021-01-17 MED ORDER — CLOPIDOGREL BISULFATE 75 MG PO TABS: ORAL_TABLET | ORAL | 1 refills | Status: DC

## 2021-01-17 MED ORDER — PANTOPRAZOLE SODIUM 40 MG PO TBEC: 40.0000 mg | DELAYED_RELEASE_TABLET | Freq: Every day | ORAL | 6 refills | Status: DC

## 2021-01-17 NOTE — Progress Notes (Signed)
Subjective:  Patient ID: Peter Taylor, male    DOB: [birth year]  Age: 70 y.o. MRN: [id number]  CC:  Chief Complaint  Patient presents with  . Follow-up    On hypertension and hyperlipidemia. Pt reports no issues with BP at home. PT reports reports no issues with current medication. Pt is fasting currently.    HPI [name] presents for   Hyperlipidemia: With history of coronary artery disease, cardiologist Dr. Irish Lack.  Treated with dual antiplatelet therapy with aspirin, Plavix after drug-eluting stent for mid LAD lesion in February 2021.  Lipitor 20 mg daily for hyperlipidemia.  Appointment with cardiology March 31.  No new side effects with meds. No new bleeding recently with DAPT. Single episode of hematuria 2 months ago. No recurrence. Noted in underwear in am. No dysuria, penile d/c, or associated symptoms.  On flomax for enlarged prostate. Urologist - Regenia Skeeter. No recent visit.  Lab Results  Component Value Date   CHOL 153 07/19/2020   HDL 59 07/19/2020   LDLCALC 82 07/19/2020   TRIG 60 07/19/2020   CHOLHDL 2.6 07/19/2020   Lab Results  Component Value Date   ALT 37 07/19/2020   AST 30 07/19/2020   ALKPHOS 151 (H) 07/19/2020   BILITOT 0.5 07/19/2020    GERD Normal endoscopy February 2021.  Protonix 40 mg daily, with recognition of certain foods that cause heartburn.  Daily Protonix has worked well. No breakthrough heartburn, but rare soreness that resolves with belching. No PUD.   Prediabetes/hyperglycemia.  glucose 116-117 past year.  Walking for exercise.  Avoiding sweetened beverages, fast food.  Lab Results  Component Value Date   HGBA1C 6.6 (H) 11/26/2019   Wt Readings from Last 3 Encounters:  01/17/21 187 lb (84.8 kg)  07/19/20 186 lb (84.4 kg)  07/04/20 185 lb (83.9 kg)   HM: Declines pneumovax - will delay until next visit. Agrees to HEP C testing.   STI testing - has done at health dept.     History Patient Active Problem List    Diagnosis Date Noted  . Coronary artery disease   . Hyperlipidemia 11/11/2019   Past Medical History:  Diagnosis Date  . CAD (coronary artery disease), native coronary artery    a. Cath 12/10/19: 90% mLAD s/p DES, mild non obstructive disease in RCA and Cx  . GERD (gastroesophageal reflux disease)   . Hyperlipidemia   . Substance abuse ([place])   . Tuberculosis    Past Surgical History:  Procedure Laterality Date  . CORONARY STENT INTERVENTION N/A 12/10/2019   Procedure: CORONARY STENT INTERVENTION;  Surgeon: Jettie Booze, MD;  Location: [place] [facility];  Service: Cardiovascular;  Laterality: N/A;  . LEFT HEART CATH AND CORONARY ANGIOGRAPHY N/A 12/10/2019   Procedure: LEFT HEART CATH AND CORONARY ANGIOGRAPHY;  Surgeon: Jettie Booze, MD;  Location: [place] [facility];  Service: Cardiovascular;  Laterality: N/A;  . NO PAST SURGERIES     Allergies  Allergen Reactions  . Penicillins Hives    Did it involve swelling of the face/tongue/throat, SOB, or low BP? No Did it involve sudden or severe rash/hives, skin peeling, or any reaction on the inside of your mouth or nose? No Did you need to seek medical attention at a hospital or doctor's office? No When did it last happen?35+ years If all above answers are "NO", may proceed with cephalosporin use.    Prior to Admission medications   Medication Sig Start Date End Date Taking? Authorizing  Provider  aspirin EC 81 MG tablet Take 1 tablet (81 mg total) by mouth daily. 11/11/19  Yes Jettie Booze, MD  atorvastatin (LIPITOR) 20 MG tablet TAKE 1 TABLET(20 MG) BY MOUTH DAILY 11/29/20  Yes Jettie Booze, MD  clopidogrel (PLAVIX) 75 MG tablet TAKE 1 TABLET(75 MG) BY MOUTH DAILY WITH BREAKFAST 01/15/21  Yes Wendie Agreste, MD  nitroGLYCERIN (NITROSTAT) 0.4 MG SL tablet Place 1 tablet (0.4 mg total) under the tongue every 5 (five) minutes as needed for chest pain. 04/18/20  Yes Jettie Booze, MD  pantoprazole  (PROTONIX) 40 MG tablet Take 1 tablet (40 mg total) by mouth daily. 07/19/20 07/19/21 Yes Wendie Agreste, MD  tamsulosin (FLOMAX) 0.4 MG CAPS capsule Take 1 capsule (0.4 mg total) by mouth daily. 12/22/19  Yes Sherwood Gambler, MD   Social History   Socioeconomic History  . Marital status: Single    Spouse name: Not on file  . Number of children: 8  . Years of education: Not on file  . Highest education level: Not on file  Occupational History  . Occupation: truck Geophysicist/field seismologist  Tobacco Use  . Smoking status: Former Research scientist (life sciences)  . Smokeless tobacco: Former Network engineer  . Vaping Use: Never used  Substance and Sexual Activity  . Alcohol use: Yes    Comment: occ  . Drug use: No  . Sexual activity: Not on file  Other Topics Concern  . Not on file  Social History Narrative  . Not on file   Social Determinants of Health   Financial Resource Strain: Not on file  Food Insecurity: Not on file  Transportation Needs: Not on file  Physical Activity: Not on file  Stress: Not on file  Social Connections: Not on file  Intimate Partner Violence: Not on file    Review of Systems  Constitutional: Negative for fatigue and unexpected weight change.  Eyes: Negative for visual disturbance.  Respiratory: Negative for cough, chest tightness and shortness of breath.   Cardiovascular: Negative for chest pain, palpitations and leg swelling.  Gastrointestinal: Negative for abdominal pain and blood in stool.  Genitourinary: Positive for hematuria (one episode. ). Negative for decreased urine volume, difficulty urinating, dysuria, flank pain, frequency, genital sores, penile discharge, penile pain, penile swelling, scrotal swelling, testicular pain and urgency.  Neurological: Negative for dizziness, light-headedness and headaches.     Objective:   Vitals:   01/17/21 0841  BP: 129/80  Pulse: 80  Temp: 97.6 F (36.4 C)  TempSrc: Temporal  Weight: 187 lb (84.8 kg)  Height: 5\' 11"  (1.803 m)      Physical Exam Vitals reviewed.  Constitutional:      Appearance: He is well-developed.  HENT:     Head: Normocephalic and atraumatic.  Eyes:     Pupils: Pupils are equal, round, and reactive to light.  Neck:     Vascular: No carotid bruit or JVD.  Cardiovascular:     Rate and Rhythm: Normal rate and regular rhythm.     Heart sounds: Normal heart sounds. No murmur heard.   Pulmonary:     Effort: Pulmonary effort is normal.     Breath sounds: Normal breath sounds. No rales.  Abdominal:     General: There is no distension.     Tenderness: There is no abdominal tenderness. There is no left CVA tenderness.  Skin:    General: Skin is warm and dry.  Neurological:     Mental Status: He is alert  and oriented to person, place, and time.    Results for orders placed or performed in visit on 01/17/21  POCT urinalysis dipstick  Result Value Ref Range   Color, UA yellow yellow   Clarity, UA clear clear   Glucose, UA negative negative mg/dL   Bilirubin, UA negative negative   Ketones, POC UA negative negative mg/dL   Spec Grav, UA 1.020 1.010 - 1.025   Blood, UA trace-lysed (A) negative   pH, UA 5.5 5.0 - 8.0   Protein Ur, POC negative negative mg/dL   Urobilinogen, UA 0.2 0.2 or 1.0 E.U./dL   Nitrite, UA Positive (A) Negative   Leukocytes, UA Small (1+) (A) Negative  POCT Microscopic Urinalysis ([facility])  Result Value Ref Range   WBC,UR,HPF,POC Few (A) None WBC/hpf   RBC,UR,HPF,POC None None RBC/hpf   Bacteria Many (A) None, Too numerous to count   Mucus Absent Absent   Epithelial Cells, UR Per Microscopy None None, Too numerous to count cells/hpf     Assessment & Plan:  JAHMAR MCKELVY is a 70 y.o. male . Hyperlipidemia, unspecified hyperlipidemia type - Plan: Comprehensive metabolic panel, Lipid panel  -Check labs.  No med changes for now.  Continue Lipitor same dose.  Coronary artery disease involving coronary bypass graft of native heart without angina pectoris -  Plan: clopidogrel (PLAVIX) 75 MG tablet  -Continue dual antiplatelet therapy, RTC precautions if new bleeding or recurrence of hematuria.  Gastroesophageal reflux disease without esophagitis - Plan: pantoprazole (PROTONIX) 40 MG tablet  -Stable with PPI, continue same.  Avoidance of trigger foods.  Hyperglycemia - Plan: Hemoglobin A1c  -Check A1c  Screening for hepatitis C  - Plan: Hepatitis C antibody  Gross hematuria - Plan: POCT urinalysis dipstick, POCT Microscopic Urinalysis ([facility])  -No sign of hematuria on in office testing.  RTC precautions if symptoms recur.  No orders of the defined types were placed in this encounter.  Patient Instructions    My new practice:  [facility] Address: [address], [place], [place] [address] Phone: [phone]  Here are a few lab drawing stations for you to have your labwork performed:  [place], [place], [place] [address]  [place], [place] [address]  If any return of blood in urine or discharge - return to discuss further.   Return to the clinic or go to the nearest emergency room if any of your symptoms worsen or new symptoms occur.    If you have lab work done today you will be contacted with your lab results within the next 2 weeks.  If you have not heard from [place] then please contact us. The fastest way to get your results is to register for My Chart.   IF you received an x-ray today, you will receive an invoice from [facility]. Please contact [facility] at [phone] with questions or concerns regarding your invoice.   IF you received labwork today, you will receive an invoice from [facility]. Please contact [facility] at [phone] with questions or concerns regarding your invoice.   Our billing staff will not be able to assist you with questions regarding bills from these companies.  You will be contacted with the lab results as soon as they  are available. The fastest way to get your results is to activate your My Chart account. Instructions are located on the last page of this paperwork. If you have not heard from [place] regarding the  results in 2 weeks, please contact this office.         Signed, Merri Ray, MD [facility]

## 2021-01-17 NOTE — Patient Instructions (Addendum)
  My new practice:  [facility] Address: [address], [place], [place] [address] Phone: [phone]  Here are a few lab drawing stations for you to have your labwork performed:  [place], [place], [place] [address]  [place], [place] [address]  If any return of blood in urine or discharge - return to discuss further.   Return to the clinic or go to the nearest emergency room if any of your symptoms worsen or new symptoms occur.    If you have lab work done today you will be contacted with your lab results within the next 2 weeks.  If you have not heard from [place] then please contact us. The fastest way to get your results is to register for My Chart.   IF you received an x-ray today, you will receive an invoice from [facility]. Please contact [facility] Radiology at [phone] with questions or concerns regarding your invoice.   IF you received labwork today, you will receive an invoice from [place]. Please contact [facility] at [phone] with questions or concerns regarding your invoice.   Our billing staff will not be able to assist you with questions regarding bills from these companies.  You will be contacted with the lab results as soon as they are available. The fastest way to get your results is to activate your My Chart account. Instructions are located on the last page of this paperwork. If you have not heard from [place] regarding the results in 2 weeks, please contact this office.

## 2021-01-18 LAB — LIPID PANEL
Chol/HDL Ratio: 2.7 ratio (ref 0.0–5.0)
Cholesterol, Total: 156 mg/dL (ref 100–199)
HDL: 57 mg/dL (ref >39)
LDL Chol Calc (NIH): 86 mg/dL (ref 0–99)
Triglycerides: 63 mg/dL (ref 0–149)
VLDL Cholesterol Cal: 13 mg/dL (ref 5–40)

## 2021-01-18 LAB — COMPREHENSIVE METABOLIC PANEL
ALT: 26 IU/L (ref 0–44)
AST: 20 IU/L (ref 0–40)
Albumin/Globulin Ratio: 1.6 (ref 1.2–2.2)
Albumin: 4.7 g/dL (ref 3.8–4.8)
Alkaline Phosphatase: 133 IU/L — ABNORMAL HIGH (ref 44–121)
BUN/Creatinine Ratio: 13 (ref 10–24)
BUN: 16 mg/dL (ref 8–27)
Bilirubin Total: 0.4 mg/dL (ref 0.0–1.2)
CO2: 21 mmol/L (ref 20–29)
Calcium: 9.5 mg/dL (ref 8.6–10.2)
Chloride: 104 mmol/L (ref 96–106)
Creatinine, Ser: 1.25 mg/dL (ref 0.76–1.27)
Globulin, Total: 3 g/dL (ref 1.5–4.5)
Glucose: 93 mg/dL (ref 65–99)
Potassium: 4.1 mmol/L (ref 3.5–5.2)
Sodium: 142 mmol/L (ref 134–144)
Total Protein: 7.7 g/dL (ref 6.0–8.5)
eGFR: 63 mL/min/{1.73_m2} (ref >59)

## 2021-01-18 LAB — HEMOGLOBIN A1C
Est. average glucose Bld gHb Est-mCnc: 148 mg/dL
Hgb A1c MFr Bld: 6.8 % — ABNORMAL HIGH (ref 4.8–5.6)

## 2021-01-18 LAB — HEPATITIS C ANTIBODY: Hep C Virus Ab: <0.1 s/co ratio (ref 0.0–0.9)

## 2021-01-25 ENCOUNTER — Encounter: Payer: Self-pay | Admitting: Interventional Cardiology

## 2021-01-25 ENCOUNTER — Other Ambulatory Visit: Payer: Self-pay

## 2021-01-25 ENCOUNTER — Ambulatory Visit: Payer: Medicare HMO | Admitting: Interventional Cardiology

## 2021-01-25 VITALS — BP 136/70 | HR 55 | Ht 71.0 in | Wt 190.4 lb

## 2021-01-25 DIAGNOSIS — Z95820 Peripheral vascular angioplasty status with implants and grafts: Secondary | ICD-10-CM | POA: Diagnosis not present

## 2021-01-25 DIAGNOSIS — K219 Gastro-esophageal reflux disease without esophagitis: Secondary | ICD-10-CM | POA: Diagnosis not present

## 2021-01-25 DIAGNOSIS — E782 Mixed hyperlipidemia: Secondary | ICD-10-CM

## 2021-01-25 DIAGNOSIS — I2510 Atherosclerotic heart disease of native coronary artery without angina pectoris: Secondary | ICD-10-CM | POA: Diagnosis not present

## 2021-01-25 MED ORDER — ATORVASTATIN CALCIUM 40 MG PO TABS: 40.0000 mg | ORAL_TABLET | Freq: Every day | ORAL | 3 refills | Status: DC

## 2021-01-25 NOTE — Patient Instructions (Signed)
Medication Instructions:  Your physician has recommended you make the following change in your medication: Stop Aspirin Increase Atorvastatin to 40 mg by mouth daily  *If you need a refill on your cardiac medications before your next appointment, please call your pharmacy*   Lab Work: Your physician recommends that you return for lab work on June 13,2022.  This will be fasting.  CBC, Lipid and Liver profiles.  The lab opens at 7:30 AM  If you have labs (blood work) drawn today and your tests are completely normal, you will receive your results only by: Marland Kitchen MyChart Message (if you have MyChart) OR . A paper copy in the mail If you have any lab test that is abnormal or we need to change your treatment, we will call you to review the results.   Testing/Procedures: none   Follow-Up: At [facility], you and your health needs are our priority.  As part of our continuing mission to provide you with exceptional heart care, we have created designated Provider Care Teams.  These Care Teams include your primary Cardiologist (physician) and Advanced Practice Providers (APPs -  Physician Assistants and Nurse Practitioners) who all work together to provide you with the care you need, when you need it.  We recommend signing up for the patient portal called "MyChart".  Sign up information is provided on this After Visit Summary.  MyChart is used to connect with patients for Virtual Visits (Telemedicine).  Patients are able to view lab/test results, encounter notes, upcoming appointments, etc.  Non-urgent messages can be sent to your provider as well.   To learn more about what you can do with MyChart, go to [email or web address].    Your next appointment:   12 month(s)  The format for your next appointment:   In Person  Provider:   You may see Larae Grooms, MD or one of the following Advanced Practice Providers on your designated Care Team:    Melina Copa, [place]  Ermalinda Barrios,  PA-C    Other Instructions  Phone number for Dr Henrene Pastor is [phone]

## 2021-01-25 NOTE — Progress Notes (Signed)
Cardiology Office Note   Date:  01/25/2021   ID:  TYAIRE ODEM, DOB 1951/08/09, MRN [id number]  PCP:  Wendie Agreste, MD    No chief complaint on file.  CAD  Wt Readings from Last 3 Encounters:  01/25/21 190 lb 6.4 oz (86.4 kg)  01/17/21 187 lb (84.8 kg)  07/19/20 186 lb (84.4 kg)       History of Present Illness: Peter Taylor is a 70 y.o. male   With CAD.  Cath in 11/2019 showed:  "Mid LAD lesion is 90% stenosed.  A drug-eluting stent was successfully placed using a STENT RESOLUTE ONYX 3.0X22.  Post intervention, there is a 0% residual stenosis.  The left ventricular systolic function is normal.  LV end diastolic pressure is normal.  The left ventricular ejection fraction is 55-65% by visual estimate.  There is no aortic valve stenosis.  Mild, nonobstructive disease in the RCA and circumflex.  Continue dual antiplatelet therapy along with aggressive secondary prevention."  Works as a Administrator.  After PCI, it was noted : "Cardiac rehab may not work with his work schedule. He is going to join a gym. He may try to stop his Imdur when the supply runs out. If no CP, he can stay off the medicine."  He reports occasional belching.  This has persisted despite protonix.  No sx like his angina before stent placement.    He did get his COVID shots and got his booster.  Denies : Chest pain. Dizziness. Leg edema. Nitroglycerin use. Orthopnea. Palpitations. Paroxysmal nocturnal dyspnea. Shortness of breath. Syncope.   Eats a lot of Doritos.  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  Past Medical History:  Diagnosis Date  . CAD (coronary artery disease), native coronary artery    a. Cath 12/10/19: 90% mLAD s/p DES, mild non obstructive disease in RCA and Cx  . GERD (gastroesophageal reflux disease)   . Hyperlipidemia   . Substance abuse ([place])   . Tuberculosis     Past Surgical History:  Procedure Laterality Date  . CORONARY STENT INTERVENTION  N/A 12/10/2019   Procedure: CORONARY STENT INTERVENTION;  Surgeon: Jettie Booze, MD;  Location: [place] [facility];  Service: Cardiovascular;  Laterality: N/A;  . LEFT HEART CATH AND CORONARY ANGIOGRAPHY N/A 12/10/2019   Procedure: LEFT HEART CATH AND CORONARY ANGIOGRAPHY;  Surgeon: Jettie Booze, MD;  Location: [place] [facility];  Service: Cardiovascular;  Laterality: N/A;  . NO PAST SURGERIES       Current Outpatient Medications  Medication Sig Dispense Refill  . aspirin EC 81 MG tablet Take 1 tablet (81 mg total) by mouth daily. 90 tablet 3  . atorvastatin (LIPITOR) 20 MG tablet TAKE 1 TABLET(20 MG) BY MOUTH DAILY 90 tablet 1  . clopidogrel (PLAVIX) 75 MG tablet TAKE 1 TABLET(75 MG) BY MOUTH DAILY WITH BREAKFAST 90 tablet 1  . nitroGLYCERIN (NITROSTAT) 0.4 MG SL tablet Place 1 tablet (0.4 mg total) under the tongue every 5 (five) minutes as needed for chest pain. 25 tablet 3  . pantoprazole (PROTONIX) 40 MG tablet Take 1 tablet (40 mg total) by mouth daily. 30 tablet 6  . tamsulosin (FLOMAX) 0.4 MG CAPS capsule Take 1 capsule (0.4 mg total) by mouth daily. 30 capsule 0   No current facility-administered medications for this visit.    Allergies:   Penicillins    Social History:  The patient  reports that he has quit smoking. He has quit using  smokeless tobacco. He reports current alcohol use. He reports that he does not use drugs.   Family History:  The patient's family history includes Cancer in his brother and mother; Ovarian cancer in his sister, sister, and sister.    ROS:  Please see the history of present illness.   Otherwise, review of systems are positive for belching.   All other systems are reviewed and negative.    PHYSICAL EXAM: VS:  BP 136/70   Pulse (!) 55   Ht 5\' 11"  (1.803 m)   Wt 190 lb 6.4 oz (86.4 kg)   SpO2 95%   BMI 26.56 kg/m  , BMI Body mass index is 26.56 kg/m. GEN: Well nourished, well developed, in no acute distress  HEENT: normal   Neck: no JVD, carotid bruits, or masses Cardiac: RRR; no murmurs, rubs, or gallops,no edema  Respiratory:  clear to auscultation bilaterally, normal work of breathing GI: soft, nontender, nondistended, + BS MS: no deformity or atrophy  Skin: warm and dry, no rash Neuro:  Strength and sensation are intact Psych: euthymic mood, full affect   EKG:   The ekg ordered today demonstrates NSR, no ST changes; no change from 2021   Recent Labs: 01/17/2021: ALT 26; BUN 16; Creatinine, Ser 1.25; Potassium 4.1; Sodium 142   Lipid Panel    Component Value Date/Time   CHOL 156 01/17/2021 1006   TRIG 63 01/17/2021 1006   HDL 57 01/17/2021 1006   CHOLHDL 2.7 01/17/2021 1006   LDLCALC 86 01/17/2021 1006     Other studies Reviewed: Additional studies/ records that were reviewed today with results demonstrating: LDL 86 in 3/22.   ASSESSMENT AND PLAN:  1. CAD: No angina. Continue aggressive secondary prevention.  OK to stop aspirin. Continue clopidogrel monotherapy.  Had some rare bleeding that sounded like hemorhoids.  2. Hyperlipidemia: LDL target 70.  86 at last check.  Increase atorvastatin to 40 mg daily.   Whole food, plant based diet.   Check CBC, lipids and liver in 2-3 months 3. Headaches: resolved once Imdur was stopped. 4. GERD: Continue protonix. He will f/u with Dr. Henrene Pastor regarding his belching issues.    Current medicines are reviewed at length with the patient today.  The patient concerns regarding his medicines were addressed.  The following changes have been made:  No change  Labs/ tests ordered today include:  No orders of the defined types were placed in this encounter.   Recommend 150 minutes/week of aerobic exercise Low fat, low carb, high fiber diet recommended  Disposition:   FU in 1 year   Signed, Larae Grooms, MD  01/25/2021 9:44 AM    [facility] [place], [place], [place]  [address] Phone: [phone]; Fax: [phone]

## 2021-02-27 ENCOUNTER — Other Ambulatory Visit: Payer: Self-pay | Admitting: Interventional Cardiology

## 2021-02-27 ENCOUNTER — Other Ambulatory Visit: Payer: Self-pay | Admitting: Family Medicine

## 2021-02-27 DIAGNOSIS — K219 Gastro-esophageal reflux disease without esophagitis: Secondary | ICD-10-CM

## 2021-02-28 ENCOUNTER — Other Ambulatory Visit: Payer: Self-pay | Admitting: Family Medicine

## 2021-02-28 ENCOUNTER — Telehealth: Payer: Self-pay

## 2021-02-28 ENCOUNTER — Other Ambulatory Visit: Payer: Self-pay

## 2021-02-28 MED ORDER — TAMSULOSIN HCL 0.4 MG PO CAPS: 0.4000 mg | ORAL_CAPSULE | Freq: Every day | ORAL | 0 refills | Status: DC

## 2021-02-28 NOTE — Telephone Encounter (Signed)
Medication sent to patient's pharmacy.

## 2021-02-28 NOTE — Telephone Encounter (Signed)
  LAST APPOINTMENT DATE: 01/17/2021   NEXT APPOINTMENT DATE:@Visit  date not found  MEDICATION:tamsulosin (FLOMAX) 0.4 MG CAPS capsule  PHARMACY:[id number] - [place], [place] - [address] AT [address]  Comments: Patient states he only has three pills left and is going on vacation Friday.   Please advise

## 2021-03-06 ENCOUNTER — Encounter: Payer: Self-pay | Admitting: Registered Nurse

## 2021-03-06 ENCOUNTER — Telehealth: Payer: Self-pay | Admitting: Registered Nurse

## 2021-03-06 ENCOUNTER — Ambulatory Visit (INDEPENDENT_AMBULATORY_CARE_PROVIDER_SITE_OTHER): Payer: Medicare HMO | Admitting: Registered Nurse

## 2021-03-06 ENCOUNTER — Other Ambulatory Visit: Payer: Self-pay

## 2021-03-06 VITALS — BP 115/72 | HR 55 | Temp 98.0°F | Resp 18 | Ht 71.0 in | Wt 187.2 lb

## 2021-03-06 DIAGNOSIS — N390 Urinary tract infection, site not specified: Secondary | ICD-10-CM | POA: Diagnosis not present

## 2021-03-06 DIAGNOSIS — B952 Enterococcus as the cause of diseases classified elsewhere: Secondary | ICD-10-CM

## 2021-03-06 DIAGNOSIS — N3000 Acute cystitis without hematuria: Secondary | ICD-10-CM

## 2021-03-06 LAB — POCT URINALYSIS DIP (MANUAL ENTRY)
Bilirubin, UA: NEGATIVE
Blood, UA: NEGATIVE
Glucose, UA: NEGATIVE mg/dL
Ketones, POC UA: NEGATIVE mg/dL
Nitrite, UA: POSITIVE — AB
Spec Grav, UA: 1.015 (ref 1.010–1.025)
Urobilinogen, UA: 0.2 E.U./dL
pH, UA: 5 (ref 5.0–8.0)

## 2021-03-06 MED ORDER — SULFAMETHOXAZOLE-TRIMETHOPRIM 800-160 MG PO TABS: 1.0000 | ORAL_TABLET | Freq: Two times a day (BID) | ORAL | 0 refills | Status: DC

## 2021-03-06 NOTE — Telephone Encounter (Signed)
Pt would like to script sent into [facility] on [address] for the UTI. Please advise

## 2021-03-06 NOTE — Progress Notes (Signed)
Acute Office Visit  Subjective:    Patient ID: Peter Taylor, male    DOB: 06/28/1951, 70 y.o.   MRN: [id number]  Chief Complaint  Patient presents with  . Urinary Tract Infection    Patient state he had his physical done at work and was told he had an UTI to come see the provider. Per patient he has noticed some odor from his urine that has a burnt smell to it.     HPI Patient is in today for UTI  Had CPE at work with some basic labs. Was told he might have UTI Last labs with [place] around 1 mo ago show nitrites in urine  Today has some frequency, urgency, and mild suprapubic pain. Some dysuria. On and off symptoms. No gross hematuria, flank pain, or systemic symptoms Denies new sexual partners or risk for exposure No discharge or testicular pain  Past Medical History:  Diagnosis Date  . CAD (coronary artery disease), native coronary artery    a. Cath 12/10/19: 90% mLAD s/p DES, mild non obstructive disease in RCA and Cx  . GERD (gastroesophageal reflux disease)   . Hyperlipidemia   . Substance abuse ([place])   . Tuberculosis     Past Surgical History:  Procedure Laterality Date  . CORONARY STENT INTERVENTION N/A 12/10/2019   Procedure: CORONARY STENT INTERVENTION;  Surgeon: Jettie Booze, MD;  Location: [facility];  Service: Cardiovascular;  Laterality: N/A;  . LEFT HEART CATH AND CORONARY ANGIOGRAPHY N/A 12/10/2019   Procedure: LEFT HEART CATH AND CORONARY ANGIOGRAPHY;  Surgeon: Jettie Booze, MD;  Location: [facility];  Service: Cardiovascular;  Laterality: N/A;  . NO PAST SURGERIES      Family History  Problem Relation Age of Onset  . Cancer Mother        unsure type  . Cancer Brother        pancreatic  . Ovarian cancer Sister   . Ovarian cancer Sister   . Ovarian cancer Sister     Social History   Socioeconomic History  . Marital status: Single    Spouse name: Not on file  . Number of children: 8  . Years of education: Not on  file  . Highest education level: Not on file  Occupational History  . Occupation: truck Geophysicist/field seismologist  Tobacco Use  . Smoking status: Former Research scientist (life sciences)  . Smokeless tobacco: Former Network engineer  . Vaping Use: Never used  Substance and Sexual Activity  . Alcohol use: Yes    Comment: occ  . Drug use: No  . Sexual activity: Not on file  Other Topics Concern  . Not on file  Social History Narrative  . Not on file   Social Determinants of Health   Financial Resource Strain: Not on file  Food Insecurity: Not on file  Transportation Needs: Not on file  Physical Activity: Not on file  Stress: Not on file  Social Connections: Not on file  Intimate Partner Violence: Not on file    Outpatient Medications Prior to Visit  Medication Sig Dispense Refill  . atorvastatin (LIPITOR) 40 MG tablet Take 1 tablet (40 mg total) by mouth daily. 90 tablet 3  . clopidogrel (PLAVIX) 75 MG tablet TAKE 1 TABLET(75 MG) BY MOUTH DAILY WITH BREAKFAST 90 tablet 1  . pantoprazole (PROTONIX) 40 MG tablet Take 1 tablet (40 mg total) by mouth daily. 30 tablet 6  . tamsulosin (FLOMAX) 0.4 MG CAPS capsule Take 1  capsule (0.4 mg total) by mouth daily. 30 capsule 0  . nitroGLYCERIN (NITROSTAT) 0.4 MG SL tablet Place 1 tablet (0.4 mg total) under the tongue every 5 (five) minutes as needed for chest pain. (Patient not taking: Reported on 03/06/2021) 25 tablet 3   No facility-administered medications prior to visit.    Allergies  Allergen Reactions  . Penicillins Hives    Did it involve swelling of the face/tongue/throat, SOB, or low BP? No Did it involve sudden or severe rash/hives, skin peeling, or any reaction on the inside of your mouth or nose? No Did you need to seek medical attention at a hospital or doctor's office? No When did it last happen?35+ years If all above answers are "NO", may proceed with cephalosporin use.     Review of Systems Per hpi      Objective:    Physical Exam Vitals and  nursing note reviewed.  Constitutional:      Appearance: Normal appearance.  Cardiovascular:     Rate and Rhythm: Normal rate and regular rhythm.     Pulses: Normal pulses.     Heart sounds: Normal heart sounds. No murmur heard. No friction rub. No gallop.   Pulmonary:     Effort: Pulmonary effort is normal. No respiratory distress.     Breath sounds: Normal breath sounds. No stridor. No wheezing, rhonchi or rales.  Neurological:     General: No focal deficit present.     Mental Status: He is alert. Mental status is at baseline.  Psychiatric:        Mood and Affect: Mood normal.        Behavior: Behavior normal.        Thought Content: Thought content normal.        Judgment: Judgment normal.     BP 115/72   Pulse (!) 55   Temp 98 F (36.7 C) (Temporal)   Resp 18   Ht 5' 11"  (1.803 m)   Wt 187 lb 3.2 oz (84.9 kg)   SpO2 99%   BMI 26.11 kg/m  Wt Readings from Last 3 Encounters:  03/06/21 187 lb 3.2 oz (84.9 kg)  01/25/21 190 lb 6.4 oz (86.4 kg)  01/17/21 187 lb (84.8 kg)    There are no preventive care reminders to display for this patient.  There are no preventive care reminders to display for this patient.   Lab Results  Component Value Date   TSH 1.930 11/26/2019   Lab Results  Component Value Date   WBC 7.4 12/22/2019   HGB 13.7 12/22/2019   HCT 42.4 12/22/2019   MCV 88.1 12/22/2019   PLT 349 12/22/2019   Lab Results  Component Value Date   NA 142 01/17/2021   K 4.1 01/17/2021   CO2 21 01/17/2021   GLUCOSE 93 01/17/2021   BUN 16 01/17/2021   CREATININE 1.25 01/17/2021   BILITOT 0.4 01/17/2021   ALKPHOS 133 (H) 01/17/2021   AST 20 01/17/2021   ALT 26 01/17/2021   PROT 7.7 01/17/2021   ALBUMIN 4.7 01/17/2021   CALCIUM 9.5 01/17/2021   ANIONGAP 10 12/22/2019   EGFR 63 01/17/2021   Lab Results  Component Value Date   CHOL 156 01/17/2021   Lab Results  Component Value Date   HDL 57 01/17/2021   Lab Results  Component Value Date    LDLCALC 86 01/17/2021   Lab Results  Component Value Date   TRIG 63 01/17/2021   Lab Results  Component Value  Date   CHOLHDL 2.7 01/17/2021   Lab Results  Component Value Date   HGBA1C 6.8 (H) 01/17/2021       Assessment & Plan:   Problem List Items Addressed This Visit   None   Visit Diagnoses    UTI (urinary tract infection) due to Enterococcus    -  Primary   Relevant Medications   sulfamethoxazole-trimethoprim (BACTRIM DS) 800-160 MG tablet   Other Relevant Orders   POCT urinalysis dipstick (Completed)   Acute cystitis without hematuria       Relevant Orders   Urine Culture       Meds ordered this encounter  Medications  . sulfamethoxazole-trimethoprim (BACTRIM DS) 800-160 MG tablet    Sig: Take 1 tablet by mouth 2 (two) times daily.    Dispense:  14 tablet    Refill:  0    Order Specific Question:   Supervising Provider    Answer:   Carlota Raspberry, JEFFREY R [[id number]]   PLAN  Acute cystitis with leuks and nitrites on poct ua  Culture sent  Bactrim po bid for seven days  Return if worsening or failing to improve  Patient encouraged to call clinic with any questions, comments, or concerns.  Maximiano Coss, NP

## 2021-03-06 NOTE — Patient Instructions (Signed)
° ° ° °  If you have lab work done today you will be contacted with your lab results within the next 2 weeks.  If you have not heard from us then please contact us. The fastest way to get your results is to register for My Chart. ° ° °IF you received an x-ray today, you will receive an invoice from [facility]. Please contact [facility] at [phone] with questions or concerns regarding your invoice.  ° °IF you received labwork today, you will receive an invoice from [facility]. Please contact [facility] at [phone] with questions or concerns regarding your invoice.  ° °Our billing staff will not be able to assist you with questions regarding bills from these companies. ° °You will be contacted with the lab results as soon as they are available. The fastest way to get your results is to activate your My Chart account. Instructions are located on the last page of this paperwork. If you have not heard from us regarding the results in 2 weeks, please contact this office. °  ° ° ° °

## 2021-03-06 NOTE — Telephone Encounter (Signed)
Sent Thanks Rich

## 2021-03-08 ENCOUNTER — Encounter: Payer: Self-pay | Admitting: Registered Nurse

## 2021-03-08 LAB — URINE CULTURE
MICRO NUMBER:: 11871755
SPECIMEN QUALITY:: ADEQUATE

## 2021-04-04 ENCOUNTER — Emergency Department (HOSPITAL_COMMUNITY): Payer: Medicare HMO

## 2021-04-04 ENCOUNTER — Encounter (HOSPITAL_COMMUNITY): Payer: Self-pay

## 2021-04-04 ENCOUNTER — Other Ambulatory Visit: Payer: Self-pay

## 2021-04-04 ENCOUNTER — Emergency Department (HOSPITAL_COMMUNITY)
Admission: EM | Admit: 2021-04-04 | Discharge: 2021-04-04 | Disposition: A | Discharge status: Home / Self Care | Payer: Medicare HMO | Attending: Emergency Medicine | Admitting: Emergency Medicine

## 2021-04-04 DIAGNOSIS — R202 Paresthesia of skin: Secondary | ICD-10-CM | POA: Insufficient documentation

## 2021-04-04 DIAGNOSIS — Z9861 Coronary angioplasty status: Secondary | ICD-10-CM | POA: Diagnosis not present

## 2021-04-04 DIAGNOSIS — E079 Disorder of thyroid, unspecified: Secondary | ICD-10-CM | POA: Insufficient documentation

## 2021-04-04 DIAGNOSIS — M542 Cervicalgia: Secondary | ICD-10-CM | POA: Insufficient documentation

## 2021-04-04 DIAGNOSIS — M5033 Other cervical disc degeneration, cervicothoracic region: Secondary | ICD-10-CM | POA: Insufficient documentation

## 2021-04-04 DIAGNOSIS — E041 Nontoxic single thyroid nodule: Secondary | ICD-10-CM | POA: Diagnosis not present

## 2021-04-04 DIAGNOSIS — M5030 Other cervical disc degeneration, unspecified cervical region: Secondary | ICD-10-CM

## 2021-04-04 DIAGNOSIS — Y92410 Unspecified street and highway as the place of occurrence of the external cause: Secondary | ICD-10-CM | POA: Diagnosis not present

## 2021-04-04 DIAGNOSIS — R519 Headache, unspecified: Secondary | ICD-10-CM | POA: Insufficient documentation

## 2021-04-04 DIAGNOSIS — Z87891 Personal history of nicotine dependence: Secondary | ICD-10-CM | POA: Insufficient documentation

## 2021-04-04 DIAGNOSIS — S161XXA Strain of muscle, fascia and tendon at neck level, initial encounter: Secondary | ICD-10-CM

## 2021-04-04 DIAGNOSIS — S199XXA Unspecified injury of neck, initial encounter: Secondary | ICD-10-CM | POA: Diagnosis not present

## 2021-04-04 DIAGNOSIS — M2578 Osteophyte, vertebrae: Secondary | ICD-10-CM | POA: Diagnosis not present

## 2021-04-04 DIAGNOSIS — S0990XA Unspecified injury of head, initial encounter: Secondary | ICD-10-CM | POA: Diagnosis not present

## 2021-04-04 DIAGNOSIS — I2510 Atherosclerotic heart disease of native coronary artery without angina pectoris: Secondary | ICD-10-CM | POA: Insufficient documentation

## 2021-04-04 DIAGNOSIS — E042 Nontoxic multinodular goiter: Secondary | ICD-10-CM | POA: Diagnosis not present

## 2021-04-04 MED ORDER — PREDNISONE 20 MG PO TABS
60.0000 mg | ORAL_TABLET | Freq: Once | ORAL | Status: AC
  Administered 2021-04-04: 60 mg via ORAL
  Filled 2021-04-04: qty 3

## 2021-04-04 MED ORDER — ACETAMINOPHEN 500 MG PO TABS
1000.0000 mg | ORAL_TABLET | Freq: Once | ORAL | Status: AC
  Administered 2021-04-04: 1000 mg via ORAL
  Filled 2021-04-04: qty 2

## 2021-04-04 MED ORDER — PREDNISONE 20 MG PO TABS: ORAL_TABLET | ORAL | 0 refills | Status: DC
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
## 2021-04-04 NOTE — Discharge Instructions (Addendum)
It was our pleasure to provide your ER care today - we hope that you feel better.Take prednisone as prescribed, take acetaminophen as need for pain.  Your ct scan was read as showing:1. No acute intracranial findings. 2. No evidence of acute fracture or traumatic listhesis of the cervical spine.  3. Bulky bridging anterior endplate osteophytes extending from C2-C3 to T1-T2, suggesting DISH. Discontinuity of the osteophyte anteriorly at C2-3 is favored chronic given the corticated appearance and lack of associated prevertebral soft tissue swelling. 4. Ossification of the posterior longitudinal ligament extending rom C3 to C6-7. 5. Left thyroid lobe is enlarged and  multiple nodules. Left thyroid lobe extends into the superior mediastinum and displaces the trachea to the right. Rec non emergent thyroid ultrasound.  For neck issues, follow up with spine specialist in next couple weeks - have them reviewed your CT.   For thyroid issues, follow up with primary care doctor in the coming week - have them arrange ultrasound and prompt follow up for that issue.   Return to ER if worse, new symptoms, new or severe pain, numbness/weakness, or other concern.

## 2021-04-04 NOTE — ED Notes (Signed)
Patient transported to CT 
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
## 2021-04-07 ENCOUNTER — Other Ambulatory Visit: Payer: Self-pay | Admitting: Family Medicine

## 2021-04-09 ENCOUNTER — Other Ambulatory Visit: Payer: Medicare HMO

## 2021-04-09 ENCOUNTER — Other Ambulatory Visit: Payer: Self-pay

## 2021-04-09 DIAGNOSIS — Z95820 Peripheral vascular angioplasty status with implants and grafts: Secondary | ICD-10-CM

## 2021-04-09 DIAGNOSIS — E782 Mixed hyperlipidemia: Secondary | ICD-10-CM | POA: Diagnosis not present

## 2021-04-09 DIAGNOSIS — I2510 Atherosclerotic heart disease of native coronary artery without angina pectoris: Secondary | ICD-10-CM | POA: Diagnosis not present

## 2021-04-09 LAB — LIPID PANEL
Chol/HDL Ratio: 2 ratio (ref 0.0–5.0)
Cholesterol, Total: 167 mg/dL (ref 100–199)
HDL: 85 mg/dL (ref >39)
LDL Chol Calc (NIH): 69 mg/dL (ref 0–99)
Triglycerides: 68 mg/dL (ref 0–149)
VLDL Cholesterol Cal: 13 mg/dL (ref 5–40)

## 2021-04-09 LAB — HEPATIC FUNCTION PANEL
ALT: 27 IU/L (ref 0–44)
AST: 12 IU/L (ref 0–40)
Albumin: 4.7 g/dL (ref 3.8–4.8)
Alkaline Phosphatase: 146 IU/L — ABNORMAL HIGH (ref 44–121)
Bilirubin Total: 0.5 mg/dL (ref 0.0–1.2)
Bilirubin, Direct: 0.14 mg/dL (ref 0.00–0.40)
Total Protein: 7.5 g/dL (ref 6.0–8.5)

## 2021-04-09 LAB — CBC
Hematocrit: 43.8 % (ref 37.5–51.0)
Hemoglobin: 14.5 g/dL (ref 13.0–17.7)
MCH: 28.4 pg (ref 26.6–33.0)
MCHC: 33.1 g/dL (ref 31.5–35.7)
MCV: 86 fL (ref 79–97)
Platelets: 309 10*3/uL (ref 150–450)
RBC: 5.11 x10E6/uL (ref 4.14–5.80)
RDW: 14.4 % (ref 11.6–15.4)
WBC: 13.8 10*3/uL — ABNORMAL HIGH (ref 3.4–10.8)

## 2021-04-09 NOTE — Telephone Encounter (Signed)
Patient called back again and restated that he needs his medication today

## 2021-04-09 NOTE — Telephone Encounter (Signed)
Patient called this morning and said that it is very important that he gets this prescription filled today - please advise.

## 2021-04-09 NOTE — Telephone Encounter (Signed)
Medication sent to pharmacy  

## 2021-04-23 ENCOUNTER — Ambulatory Visit (INDEPENDENT_AMBULATORY_CARE_PROVIDER_SITE_OTHER): Payer: Medicare HMO | Admitting: Family Medicine

## 2021-04-23 ENCOUNTER — Other Ambulatory Visit: Payer: Self-pay

## 2021-04-23 VITALS — BP 118/72 | HR 64 | Temp 98.2°F | Resp 16 | Ht 71.0 in | Wt 189.4 lb

## 2021-04-23 DIAGNOSIS — E042 Nontoxic multinodular goiter: Secondary | ICD-10-CM | POA: Diagnosis not present

## 2021-04-23 DIAGNOSIS — R300 Dysuria: Secondary | ICD-10-CM

## 2021-04-23 DIAGNOSIS — B9620 Unspecified Escherichia coli [E. coli] as the cause of diseases classified elsewhere: Secondary | ICD-10-CM

## 2021-04-23 DIAGNOSIS — E010 Iodine-deficiency related diffuse (endemic) goiter: Secondary | ICD-10-CM | POA: Diagnosis not present

## 2021-04-23 DIAGNOSIS — N390 Urinary tract infection, site not specified: Secondary | ICD-10-CM

## 2021-04-23 LAB — TSH: TSH: 1.85 u[IU]/mL (ref 0.35–4.50)

## 2021-04-23 LAB — POCT URINALYSIS DIPSTICK
Bilirubin, UA: NEGATIVE
Glucose, UA: NEGATIVE
Ketones, UA: NEGATIVE
Nitrite, UA: NEGATIVE
Protein, UA: NEGATIVE
Spec Grav, UA: 1.02 (ref 1.010–1.025)
Urobilinogen, UA: 0.2 E.U./dL
pH, UA: 6 (ref 5.0–8.0)

## 2021-04-23 LAB — PSA: PSA: 7.26 ng/mL — ABNORMAL HIGH (ref 0.10–4.00)

## 2021-04-23 MED ORDER — SULFAMETHOXAZOLE-TRIMETHOPRIM 800-160 MG PO TABS: 1.0000 | ORAL_TABLET | Freq: Two times a day (BID) | ORAL | 0 refills | Status: DC

## 2021-04-23 NOTE — Patient Instructions (Addendum)
I will check your thyroid test and ultrasound, but likely will refer you to thyroid surgeon.  I am going to check urine culture, and PSA.  Restart septa for 2 weeks this time. Return to the clinic or go to the nearest emergency room if any of your symptoms worsen or new symptoms occur.   Urinary Tract Infection, Adult  A urinary tract infection (UTI) is an infection of any part of the urinary tract. The urinary tract includes the kidneys, ureters, bladder, and urethra.These organs make, store, and get rid of urine in the body. An upper UTI affects the ureters and kidneys. A lower UTI affects the bladderand urethra. What are the causes? Most urinary tract infections are caused by bacteria in your genital area around your urethra, where urine leaves your body. These bacteria grow andcause inflammation of your urinary tract. What increases the risk? You are more likely to develop this condition if: You have a urinary catheter that stays in place. You are not able to control when you urinate or have a bowel movement (incontinence). You are male and you: Use a spermicide or diaphragm for birth control. Have low estrogen levels. Are pregnant. You have certain genes that increase your risk. You are sexually active. You take antibiotic medicines. You have a condition that causes your flow of urine to slow down, such as: An enlarged prostate, if you are male. Blockage in your urethra. A kidney stone. A nerve condition that affects your bladder control (neurogenic bladder). Not getting enough to drink, or not urinating often. You have certain medical conditions, such as: Diabetes. A weak disease-fighting system (immunesystem). Sickle cell disease. Gout. Spinal cord injury. What are the signs or symptoms? Symptoms of this condition include: Needing to urinate right away (urgency). Frequent urination. This may include small amounts of urine each time you urinate. Pain or burning with  urination. Blood in the urine. Urine that smells bad or unusual. Trouble urinating. Cloudy urine. Vaginal discharge, if you are male. Pain in the abdomen or the lower back. You may also have: Vomiting or a decreased appetite. Confusion. Irritability or tiredness. A fever or chills. Diarrhea. The first symptom in older adults may be confusion. In some cases, they may nothave any symptoms until the infection has worsened. How is this diagnosed? This condition is diagnosed based on your medical history and a physical exam. You may also have other tests, including: Urine tests. Blood tests. Tests for STIs (sexually transmitted infections). If you have had more than one UTI, a cystoscopy or imaging studies may be doneto determine the cause of the infections. How is this treated? Treatment for this condition includes: Antibiotic medicine. Over-the-counter medicines to treat discomfort. Drinking enough water to stay hydrated. If you have frequent infections or have other conditions such as a kidney stone, you may need to see a health care provider who specializes in the urinary tract (urologist). In rare cases, urinary tract infections can cause sepsis. Sepsis is a life-threatening condition that occurs when the body responds to an infection. Sepsis is treated in the hospital with IV antibiotics, fluids, and othermedicines. Follow these instructions at home:  Medicines Take over-the-counter and prescription medicines only as told by your health care provider. If you were prescribed an antibiotic medicine, take it as told by your health care provider. Do not stop using the antibiotic even if you start to feel better. General instructions Make sure you: Empty your bladder often and completely. Do not hold urine for long periods  of time. Empty your bladder after sex. Wipe from front to back after urinating or having a bowel movement if you are male. Use each tissue only one time when  you wipe. Drink enough fluid to keep your urine pale yellow. Keep all follow-up visits. This is important. Contact a health care provider if: Your symptoms do not get better after 1-2 days. Your symptoms go away and then return. Get help right away if: You have severe pain in your back or your lower abdomen. You have a fever or chills. You have nausea or vomiting. Summary A urinary tract infection (UTI) is an infection of any part of the urinary tract, which includes the kidneys, ureters, bladder, and urethra. Most urinary tract infections are caused by bacteria in your genital area. Treatment for this condition often includes antibiotic medicines. If you were prescribed an antibiotic medicine, take it as told by your health care provider. Do not stop using the antibiotic even if you start to feel better. Keep all follow-up visits. This is important. This information is not intended to replace advice given to you by your health care provider. Make sure you discuss any questions you have with your healthcare provider. Document Revised: 05/26/2020 Document Reviewed: 05/26/2020 Elsevier Patient Education  [place].
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
## 2021-04-24 LAB — URINE CULTURE
MICRO NUMBER:: 12055289
SPECIMEN QUALITY:: ADEQUATE

## 2021-05-01 ENCOUNTER — Other Ambulatory Visit: Payer: Self-pay

## 2021-05-01 DIAGNOSIS — R972 Elevated prostate specific antigen [PSA]: Secondary | ICD-10-CM

## 2021-05-09 ENCOUNTER — Ambulatory Visit
Admission: RE | Admit: 2021-05-09 | Discharge: 2021-05-09 | Disposition: A | Discharge status: Home / Self Care | Payer: Medicare HMO | Source: Ambulatory Visit | Attending: Family Medicine | Admitting: Family Medicine

## 2021-05-09 DIAGNOSIS — E010 Iodine-deficiency related diffuse (endemic) goiter: Secondary | ICD-10-CM

## 2021-05-09 DIAGNOSIS — E041 Nontoxic single thyroid nodule: Secondary | ICD-10-CM | POA: Diagnosis not present

## 2021-05-09 DIAGNOSIS — E042 Nontoxic multinodular goiter: Secondary | ICD-10-CM

## 2021-05-16 ENCOUNTER — Other Ambulatory Visit: Payer: Self-pay | Admitting: Family Medicine

## 2021-05-16 DIAGNOSIS — E041 Nontoxic single thyroid nodule: Secondary | ICD-10-CM

## 2021-05-23 IMAGING — CT CT HEART MORP W/ CTA COR W/ SCORE W/ CA W/CM &/OR W/O CM
4 of 7 series · 8 of 20 positions shown, 9 images · IV contrast (omnipaque)
Comparison: None.
COMPARISON: None.

Addendum:
EXAM:
OVER-READ INTERPRETATION  CT CHEST

The following report is an over-read performed by radiologist Dr.
over-read does not include interpretation of cardiac or coronary
anatomy or pathology. The coronary CTA interpretation by the
cardiologist is attached.
HISTORY: 68 yo male with nonspecific chest pain
Cardiac/Coronary CTA
TECHNIQUE: The patient was scanned on a Siemens Force scanner.
PROTOCOL: A 120 kV prospective scan was triggered in the descending thoracic
aorta at 111 HU's. Axial non-contrast 3 mm slices were carried out
through the heart. The data set was analyzed on a dedicated work
station and scored using the Agatson method. Gantry rotation speed
was 250 msecs and collimation was .6 mm. Beta blockade and 0.8 mg of
sl NTG was given. The 3D data set was reconstructed in 5% intervals
of the 67-82 % of the R-R cycle. Diastolic phases were analyzed on a
dedicated work station using MPR, MIP and VRT modes. The patient
received 80mL OMNIPAQUE IOHEXOL 350 MG/ML SOLN of contrast.

[Series 6: best diast 69 % · axial · 0.39mm/px · z∈[-218,-175]mm · 2 of 323 slices shown, 3 images]
[im 108/323  vessel]
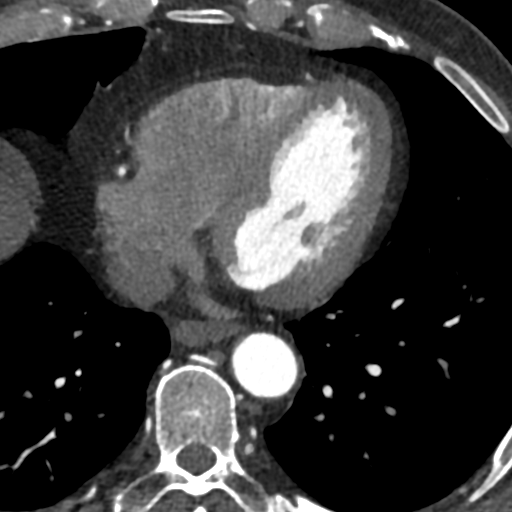
[im 108/323  lung]
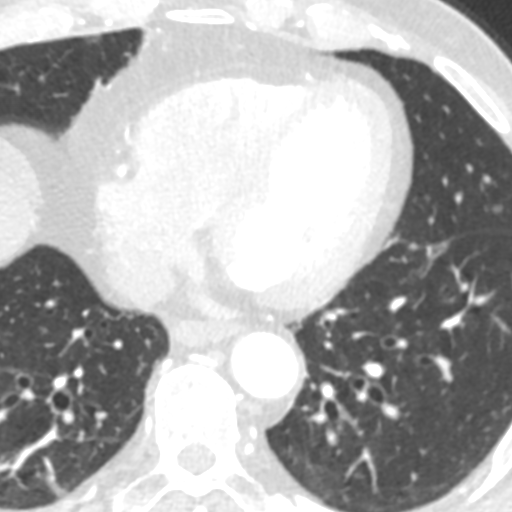
[im 215/323  vessel]
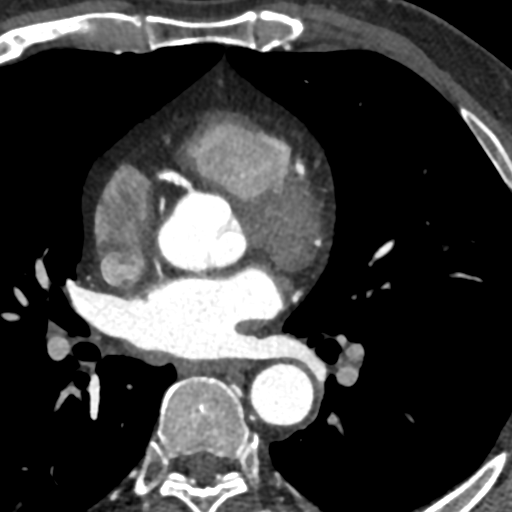

[Series 7: best syst 33 % · axial · 0.39mm/px · z∈[-218,-175]mm · 2 of 323 slices shown]
[im 108/323  vessel]
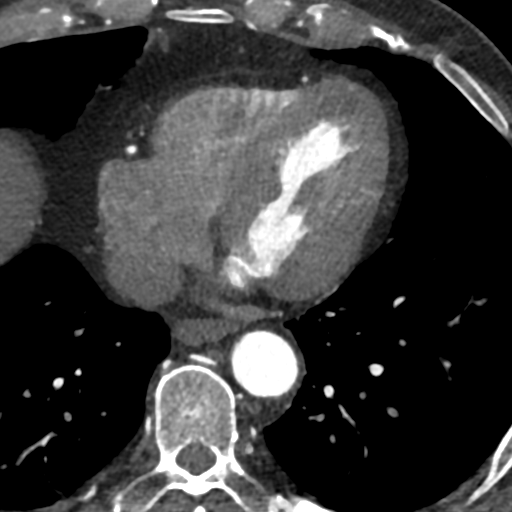
[im 215/323  vessel]
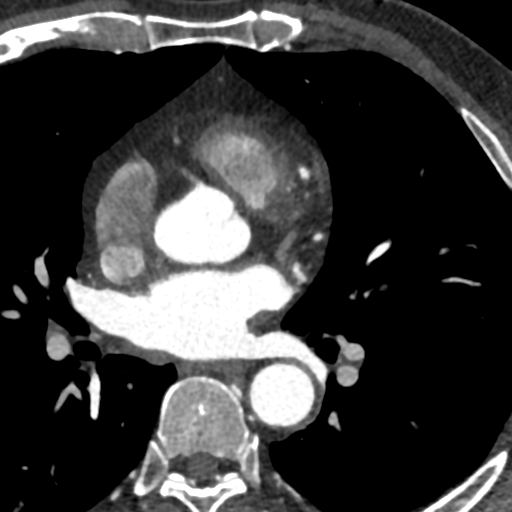

[Series 8: ts diast sharp 33 % · axial · 0.39mm/px · z∈[-218,-175]mm · 2 of 323 slices shown]
[im 108/323  lung]
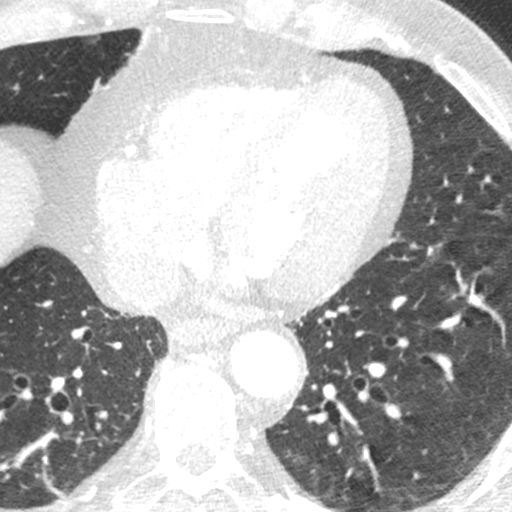
[im 215/323  lung]
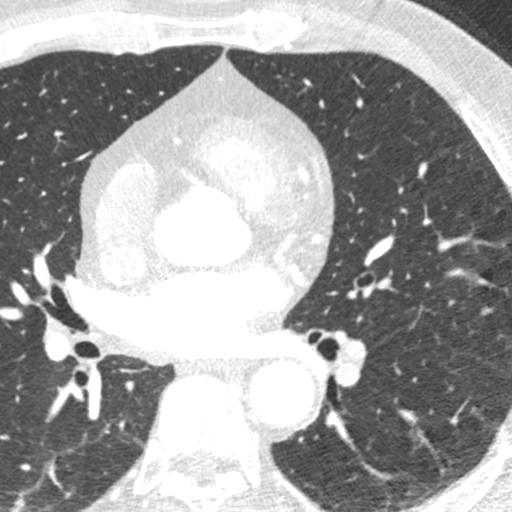

[Series 9: ts syst sharp 33 % · axial · 0.39mm/px · z∈[-218,-175]mm · 2 of 323 slices shown]
[im 108/323  lung]
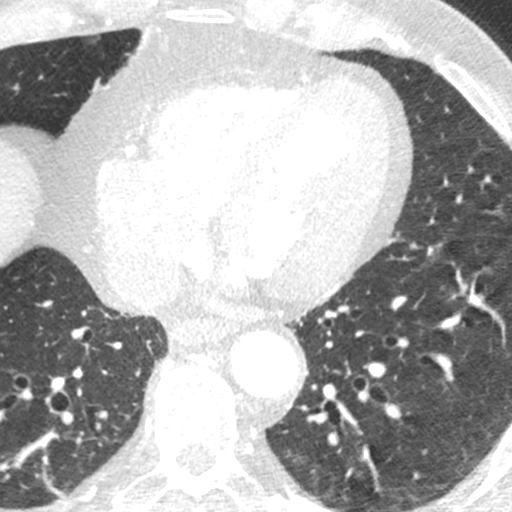
[im 215/323  lung]
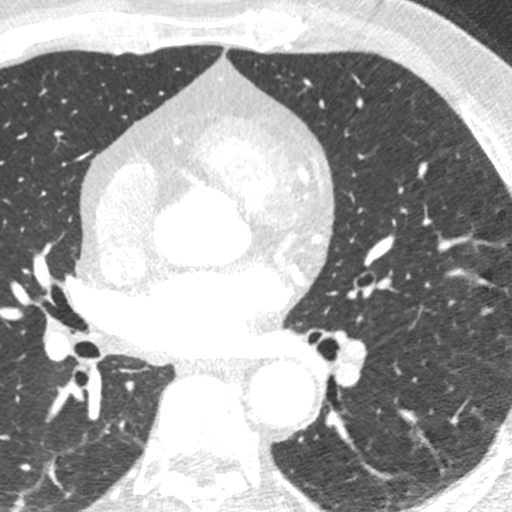

[8 of 20 positions shown; findings below may reference images not displayed]

FINDINGS: Vascular: No incidental findings.

Mediastinum/Nodes: Visualized mediastinum and hilar regions
demonstrate no lymphadenopathy or masses.

Lungs/Pleura: There is some scarring in the right lower lobe.
Visualized lungs show no evidence of pulmonary edema, consolidation,
pneumothorax, nodule or pleural fluid.

Upper Abdomen: No acute abnormality.

Musculoskeletal: No chest wall mass or suspicious bone lesions
identified.
IMPRESSION: No significant incidental findings.
FINDINGS: Quality: Good (HR 58)

Coronary calcium score: The patient's coronary artery calcium score
is 131, which places the patient in the 77th percentile.

Coronary arteries: Normal coronary origins.  Right dominance.

Right Coronary Artery: Dominant vessel. Gives off the R-PDA and
R-PLB branches. Minimal 1-24% mixed proximal stenosis ([id number]).
Mild distal non-calcified stenosis 25-49% (DGIZGISH/V). There is a
mild, non-calcified, positively remodeled plaque of the proximal
R-PDA ([id number]/V). The R-PLB branch is without stenosis.

Left Main Coronary Artery: Normal. Bifurcates into the LAD and LCx
arteries.

Left Anterior Descending Coronary Artery: There is 15 mm segment of
complex, moderate mixed proximal stenosis, 50-69% ([id number]). The
distal LAD has mild 25-49% (DGIZGISH) non-calcified stenosis.

Left Circumflex Artery: AV groove vessel, minimal 1-24% distal
stenosis. The proximal OM1 is a moderate sized vessel with mild
25-49% (DGIZGISH) ostial stenosis.

Aorta: Normal size, 30 mm at the mid ascending aorta (level of the
PA bifurcation) measured double oblique. No calcifications. No
dissection.

Aortic Valve: Trileaflet. No calcifications.

Other findings:

Normal pulmonary vein drainage into the left atrium.

Normal left atrial appendage without a thrombus.

Normal size of the pulmonary artery.
IMPRESSION: 1. Multivessel CAD with moderate, complex mixed proximal LAD
stenosis and 2 discrete mild positively remodelled plaques of the
RCA, CADRADS = 3/V. CT FFR will be performed and reported
separately.

2. Coronary calcium score of 131. This was 77th percentile for age
and sex matched control. Calcium is noted in all 3 major vessels.

3. Normal coronary origin with right dominance.

*** End of Addendum ***
EXAM:
OVER-READ INTERPRETATION  CT CHEST

The following report is an over-read performed by radiologist Dr.
over-read does not include interpretation of cardiac or coronary
anatomy or pathology. The coronary CTA interpretation by the
cardiologist is attached.
FINDINGS: Vascular: No incidental findings.

Mediastinum/Nodes: Visualized mediastinum and hilar regions
demonstrate no lymphadenopathy or masses.

Lungs/Pleura: There is some scarring in the right lower lobe.
Visualized lungs show no evidence of pulmonary edema, consolidation,
pneumothorax, nodule or pleural fluid.

Upper Abdomen: No acute abnormality.

Musculoskeletal: No chest wall mass or suspicious bone lesions
identified.
IMPRESSION: No significant incidental findings.

## 2021-06-01 ENCOUNTER — Other Ambulatory Visit: Payer: Medicare HMO

## 2021-06-07 ENCOUNTER — Other Ambulatory Visit: Payer: Medicare HMO

## 2021-06-25 ENCOUNTER — Other Ambulatory Visit (INDEPENDENT_AMBULATORY_CARE_PROVIDER_SITE_OTHER): Payer: Medicare HMO

## 2021-06-25 ENCOUNTER — Other Ambulatory Visit: Payer: Self-pay

## 2021-06-25 DIAGNOSIS — R972 Elevated prostate specific antigen [PSA]: Secondary | ICD-10-CM | POA: Diagnosis not present

## 2021-06-25 LAB — PSA: PSA: 4.54 ng/mL — ABNORMAL HIGH (ref 0.10–4.00)

## 2021-07-01 ENCOUNTER — Telehealth: Payer: Self-pay

## 2021-07-01 NOTE — Telephone Encounter (Signed)
Called and left message for patient to call PCP's office to schedule his initial [employer] [facility] Well[facility] visit.

## 2021-07-09 ENCOUNTER — Other Ambulatory Visit: Payer: Self-pay

## 2021-07-09 DIAGNOSIS — R972 Elevated prostate specific antigen [PSA]: Secondary | ICD-10-CM

## 2021-08-01 ENCOUNTER — Other Ambulatory Visit: Payer: Self-pay | Admitting: Family Medicine

## 2021-08-01 DIAGNOSIS — R1310 Dysphagia, unspecified: Secondary | ICD-10-CM | POA: Insufficient documentation

## 2021-08-01 DIAGNOSIS — E042 Nontoxic multinodular goiter: Secondary | ICD-10-CM | POA: Diagnosis not present

## 2021-08-01 DIAGNOSIS — E049 Nontoxic goiter, unspecified: Secondary | ICD-10-CM | POA: Diagnosis not present

## 2021-08-06 ENCOUNTER — Other Ambulatory Visit: Payer: Self-pay | Admitting: Surgery

## 2021-08-06 ENCOUNTER — Telehealth: Payer: Self-pay | Admitting: *Deleted

## 2021-08-06 DIAGNOSIS — E041 Nontoxic single thyroid nodule: Secondary | ICD-10-CM

## 2021-08-06 NOTE — Telephone Encounter (Signed)
See notes: Dr. Merri Ray PCP had been asked to give clearance for an upcoming procedure for the pt. Dr. Carlota Raspberry reached out to Dr. Irish Lack, cardiologist for the pt. Pt is on Plavix which is monitored by the PCP. See notes below from PCP and cardiologist conversation. This note is made specifically as FYI documentation. Requesting office is not requesting cardiac clearance.   Peter Taylor will need to be scheduled for an ultrasound guided biopsy of a thyroid nodule und I have been asked about holding plavix and timing of holding that med for his procedure.  What would be your recommendations?  Thanks.  Peter Taylor  Dr. Irish Lack: Since he is more than 1 year past his stent, ok to hold Plavix for 5 days prior to biopsy.  WOuld restart ASAP post procedure when safe from a bleeding standpoint.  It will take several doses to have a therapeutic antiplatelet effect.    thank you. -JG

## 2021-08-07 ENCOUNTER — Other Ambulatory Visit (HOSPITAL_COMMUNITY)
Admission: RE | Admit: 2021-08-07 | Discharge: 2021-08-07 | Disposition: A | Discharge status: Home / Self Care | Payer: Medicare HMO | Source: Ambulatory Visit | Attending: Radiology | Admitting: Radiology

## 2021-08-07 ENCOUNTER — Ambulatory Visit
Admission: RE | Admit: 2021-08-07 | Discharge: 2021-08-07 | Disposition: A | Discharge status: Home / Self Care | Payer: Medicare HMO | Source: Ambulatory Visit | Attending: Surgery | Admitting: Surgery

## 2021-08-07 DIAGNOSIS — D440 Neoplasm of uncertain behavior of thyroid gland: Secondary | ICD-10-CM | POA: Diagnosis not present

## 2021-08-07 DIAGNOSIS — E041 Nontoxic single thyroid nodule: Secondary | ICD-10-CM | POA: Diagnosis not present

## 2021-08-07 DIAGNOSIS — E0789 Other specified disorders of thyroid: Secondary | ICD-10-CM | POA: Diagnosis not present

## 2021-08-08 LAB — CYTOLOGY - NON PAP

## 2021-08-13 NOTE — Progress Notes (Signed)
Unfortunately the FNA biopsy returned as insufficient material for diagnosis.  Patient will need to have a repeat biopsy performed by radiology.  Claiborne Billings will be in touch to schedule.  [place], MD [facility] [place] practice Office: [phone]

## 2021-08-15 ENCOUNTER — Other Ambulatory Visit: Payer: Self-pay | Admitting: Surgery

## 2021-08-15 DIAGNOSIS — E041 Nontoxic single thyroid nodule: Secondary | ICD-10-CM

## 2021-09-06 ENCOUNTER — Other Ambulatory Visit (HOSPITAL_COMMUNITY)
Admission: RE | Admit: 2021-09-06 | Discharge: 2021-09-06 | Disposition: A | Discharge status: Home / Self Care | Payer: Medicare HMO | Source: Ambulatory Visit | Attending: Physician Assistant | Admitting: Physician Assistant

## 2021-09-06 ENCOUNTER — Ambulatory Visit
Admission: RE | Admit: 2021-09-06 | Discharge: 2021-09-06 | Disposition: A | Discharge status: Home / Self Care | Payer: Medicare HMO | Source: Ambulatory Visit | Attending: Surgery | Admitting: Surgery

## 2021-09-06 DIAGNOSIS — D34 Benign neoplasm of thyroid gland: Secondary | ICD-10-CM | POA: Diagnosis not present

## 2021-09-06 DIAGNOSIS — R972 Elevated prostate specific antigen [PSA]: Secondary | ICD-10-CM | POA: Diagnosis not present

## 2021-09-06 DIAGNOSIS — R3912 Poor urinary stream: Secondary | ICD-10-CM | POA: Diagnosis not present

## 2021-09-06 DIAGNOSIS — N5201 Erectile dysfunction due to arterial insufficiency: Secondary | ICD-10-CM | POA: Diagnosis not present

## 2021-09-06 DIAGNOSIS — E041 Nontoxic single thyroid nodule: Secondary | ICD-10-CM

## 2021-09-06 DIAGNOSIS — N401 Enlarged prostate with lower urinary tract symptoms: Secondary | ICD-10-CM | POA: Diagnosis not present

## 2021-09-09 LAB — CYTOLOGY - NON PAP

## 2021-09-10 NOTE — Progress Notes (Signed)
FNA biopsy is benign.  Await results of barium swallow.  [place], MD [facility] [place] practice Office: [phone]

## 2021-09-13 ENCOUNTER — Other Ambulatory Visit: Payer: Self-pay | Admitting: Urology

## 2021-09-13 DIAGNOSIS — R972 Elevated prostate specific antigen [PSA]: Secondary | ICD-10-CM

## 2021-09-17 ENCOUNTER — Other Ambulatory Visit: Payer: Self-pay | Admitting: Surgery

## 2021-09-17 ENCOUNTER — Other Ambulatory Visit (HOSPITAL_COMMUNITY): Payer: Self-pay | Admitting: Surgery

## 2021-09-17 DIAGNOSIS — R1310 Dysphagia, unspecified: Secondary | ICD-10-CM

## 2021-09-27 ENCOUNTER — Other Ambulatory Visit: Payer: Self-pay

## 2021-09-27 ENCOUNTER — Ambulatory Visit (HOSPITAL_COMMUNITY)
Admission: RE | Admit: 2021-09-27 | Discharge: 2021-09-27 | Disposition: A | Discharge status: Home / Self Care | Payer: Medicare HMO | Source: Ambulatory Visit | Attending: Surgery | Admitting: Surgery

## 2021-09-27 DIAGNOSIS — R1310 Dysphagia, unspecified: Secondary | ICD-10-CM | POA: Diagnosis not present

## 2021-09-27 NOTE — Progress Notes (Signed)
Barium swallow is normal.  No extrinsic compression or evidence of stricture.  [place], MD [facility] [place] practice Office: [phone]

## 2021-10-14 ENCOUNTER — Other Ambulatory Visit: Payer: Self-pay | Admitting: Family Medicine

## 2021-10-19 ENCOUNTER — Ambulatory Visit
Admission: RE | Admit: 2021-10-19 | Discharge: 2021-10-19 | Disposition: A | Discharge status: Home / Self Care | Payer: Medicare HMO | Source: Ambulatory Visit | Attending: Urology | Admitting: Urology

## 2021-10-19 ENCOUNTER — Other Ambulatory Visit: Payer: Self-pay

## 2021-10-19 DIAGNOSIS — R972 Elevated prostate specific antigen [PSA]: Secondary | ICD-10-CM

## 2021-10-19 MED ORDER — GADOBENATE DIMEGLUMINE 529 MG/ML IV SOLN
20.0000 mL | Freq: Once | INTRAVENOUS | Status: AC | PRN
  Administered 2021-10-19: 10:00:00 20 mL via INTRAVENOUS

## 2021-11-07 DIAGNOSIS — E042 Nontoxic multinodular goiter: Secondary | ICD-10-CM | POA: Diagnosis not present

## 2021-11-07 DIAGNOSIS — R1310 Dysphagia, unspecified: Secondary | ICD-10-CM | POA: Diagnosis not present

## 2021-12-14 DIAGNOSIS — N401 Enlarged prostate with lower urinary tract symptoms: Secondary | ICD-10-CM | POA: Diagnosis not present

## 2021-12-14 DIAGNOSIS — R3912 Poor urinary stream: Secondary | ICD-10-CM | POA: Diagnosis not present

## 2021-12-14 DIAGNOSIS — R972 Elevated prostate specific antigen [PSA]: Secondary | ICD-10-CM | POA: Diagnosis not present

## 2021-12-27 ENCOUNTER — Other Ambulatory Visit (HOSPITAL_COMMUNITY): Payer: Self-pay | Admitting: *Deleted

## 2021-12-27 DIAGNOSIS — R1310 Dysphagia, unspecified: Secondary | ICD-10-CM

## 2021-12-31 ENCOUNTER — Other Ambulatory Visit: Payer: Self-pay | Admitting: Family Medicine

## 2021-12-31 DIAGNOSIS — I25810 Atherosclerosis of coronary artery bypass graft(s) without angina pectoris: Secondary | ICD-10-CM

## 2022-01-02 ENCOUNTER — Other Ambulatory Visit: Payer: Self-pay

## 2022-01-02 ENCOUNTER — Encounter: Payer: Self-pay | Admitting: Registered Nurse

## 2022-01-02 ENCOUNTER — Ambulatory Visit (INDEPENDENT_AMBULATORY_CARE_PROVIDER_SITE_OTHER): Payer: Medicare HMO | Admitting: Registered Nurse

## 2022-01-02 VITALS — BP 139/81 | HR 55 | Temp 97.8°F | Resp 18 | Ht 71.0 in | Wt 193.0 lb

## 2022-01-02 DIAGNOSIS — I25810 Atherosclerosis of coronary artery bypass graft(s) without angina pectoris: Secondary | ICD-10-CM

## 2022-01-02 DIAGNOSIS — E119 Type 2 diabetes mellitus without complications: Secondary | ICD-10-CM

## 2022-01-02 DIAGNOSIS — G8929 Other chronic pain: Secondary | ICD-10-CM | POA: Diagnosis not present

## 2022-01-02 DIAGNOSIS — K219 Gastro-esophageal reflux disease without esophagitis: Secondary | ICD-10-CM

## 2022-01-02 DIAGNOSIS — M25512 Pain in left shoulder: Secondary | ICD-10-CM

## 2022-01-02 LAB — COMPREHENSIVE METABOLIC PANEL
ALT: 29 U/L (ref 0–53)
AST: 19 U/L (ref 0–37)
Albumin: 4.3 g/dL (ref 3.5–5.2)
Alkaline Phosphatase: 128 U/L — ABNORMAL HIGH (ref 39–117)
BUN: 18 mg/dL (ref 6–23)
CO2: 27 mEq/L (ref 19–32)
Calcium: 9.1 mg/dL (ref 8.4–10.5)
Chloride: 105 mEq/L (ref 96–112)
Creatinine, Ser: 1.19 mg/dL (ref 0.40–1.50)
GFR: 62.77 mL/min (ref >60.00)
Glucose, Bld: 99 mg/dL (ref 70–99)
Potassium: 3.8 mEq/L (ref 3.5–5.1)
Sodium: 141 mEq/L (ref 135–145)
Total Bilirubin: 0.6 mg/dL (ref 0.2–1.2)
Total Protein: 6.8 g/dL (ref 6.0–8.3)

## 2022-01-02 LAB — CBC WITH DIFFERENTIAL/PLATELET
Basophils Absolute: 0 10*3/uL (ref 0.0–0.1)
Basophils Relative: 0.5 % (ref 0.0–3.0)
Eosinophils Absolute: 0.3 10*3/uL (ref 0.0–0.7)
Eosinophils Relative: 6.6 % — ABNORMAL HIGH (ref 0.0–5.0)
HCT: 42.3 % (ref 39.0–52.0)
Hemoglobin: 14 g/dL (ref 13.0–17.0)
Lymphocytes Relative: 39.1 % (ref 12.0–46.0)
Lymphs Abs: 1.9 10*3/uL (ref 0.7–4.0)
MCHC: 33.2 g/dL (ref 30.0–36.0)
MCV: 87.9 fl (ref 78.0–100.0)
Monocytes Absolute: 0.3 10*3/uL (ref 0.1–1.0)
Monocytes Relative: 5.9 % (ref 3.0–12.0)
Neutro Abs: 2.4 10*3/uL (ref 1.4–7.7)
Neutrophils Relative %: 47.9 % (ref 43.0–77.0)
Platelets: 230 10*3/uL (ref 150.0–400.0)
RBC: 4.81 Mil/uL (ref 4.22–5.81)
RDW: 16.1 % — ABNORMAL HIGH (ref 11.5–15.5)
WBC: 5 10*3/uL (ref 4.0–10.5)

## 2022-01-02 LAB — HEMOGLOBIN A1C: Hgb A1c MFr Bld: 7.1 % — ABNORMAL HIGH (ref 4.6–6.5)

## 2022-01-02 MED ORDER — CLOPIDOGREL BISULFATE 75 MG PO TABS: ORAL_TABLET | ORAL | 1 refills | Status: DC

## 2022-01-02 MED ORDER — PANTOPRAZOLE SODIUM 40 MG PO TBEC: 40.0000 mg | DELAYED_RELEASE_TABLET | Freq: Every day | ORAL | 3 refills | Status: DC

## 2022-01-02 MED ORDER — METHOCARBAMOL 500 MG PO TABS: 500.0000 mg | ORAL_TABLET | Freq: Three times a day (TID) | ORAL | 0 refills | Status: DC | PRN

## 2022-01-02 NOTE — Patient Instructions (Addendum)
Mr. Parmelee - ? ?Always great to see you ? ?Call with concerns ? ?I'll let you know how labs look ? ?Can use muscle relaxer for shoulder pain as needed. Before bed will help you stay comfortable while sleeping.  ? ?Xray at [facility] at [place] - walk ins ok. ? ?Thank you ? ?Rich  ? ? ? ?If you have lab work done today you will be contacted with your lab results within the next 2 weeks.  If you have not heard from [place] then please contact us. The fastest way to get your results is to register for My Chart. ? ? ?IF you received an x-ray today, you will receive an invoice from [facility]. Please contact [facility] at [phone] with questions or concerns regarding your invoice.  ? ?IF you received labwork today, you will receive an invoice from [place]. Please contact [facility] at [phone] with questions or concerns regarding your invoice.  ? ?Our billing staff will not be able to assist you with questions regarding bills from these companies. ? ?You will be contacted with the lab results as soon as they are available. The fastest way to get your results is to activate your My Chart account. Instructions are located on the last page of this paperwork. If you have not heard from [place] regarding the results in 2 weeks, please contact this office. ?  ? ? ?

## 2022-01-02 NOTE — Progress Notes (Signed)
Established Patient Office Visit  Subjective:  Patient ID: Peter Taylor, male    DOB: 07-Oct-1951  Age: 71 y.o. MRN: [id number]  CC:  Chief Complaint  Patient presents with   Medication Refill    Patient states he need a medication refill but needed Peter appointment. Patient is also having some left arm pain for a while that comes and goes    HPI [name] presents for med refill, arm pain  Med refill Clopidogrel - for CAD. Tolerates well. No AE. No brusing/bleeding. Last CBC a number of months ago showed no concerning findings.  Pantoprazole - uses most days for GERD. No AE. Adequate control. Continues to pursue lifestyle management.  Left Arm Pain Starts at shoulder. Radiates down arm. Hx of L clavicle fracture in 1980s.  On and off for months. Has bouts of shooting pain.  Has not taken anything for pain - unsure what to take Tries to avoid activities that worsen pain, including raising elbow above shoulder level.   Past Medical History:  Diagnosis Date   CAD (coronary artery disease), native coronary artery    a. Cath 12/10/19: 90% mLAD s/p DES, mild non obstructive disease in RCA and Cx   GERD (gastroesophageal reflux disease)    Hyperlipidemia    Substance abuse ([place])    Tuberculosis     Past Surgical History:  Procedure Laterality Date   CORONARY STENT INTERVENTION N/A 12/10/2019   Procedure: CORONARY STENT INTERVENTION;  Surgeon: Jettie Booze, MD;  Location: [place] [facility];  Service: Cardiovascular;  Laterality: N/A;   LEFT HEART CATH AND CORONARY ANGIOGRAPHY N/A 12/10/2019   Procedure: LEFT HEART CATH AND CORONARY ANGIOGRAPHY;  Surgeon: Jettie Booze, MD;  Location: [place] [facility];  Service: Cardiovascular;  Laterality: N/A;   NO PAST SURGERIES      Family History  Problem Relation Age of Onset   Cancer Mother        unsure type   Cancer Brother        pancreatic   Ovarian cancer Sister    Ovarian cancer Sister    Ovarian cancer  Sister     Social History   Socioeconomic History   Marital status: Single    Spouse name: Not on file   Number of children: 8   Years of education: Not on file   Highest education level: Not on file  Occupational History   Occupation: truck driver  Tobacco Use   Smoking status: Former   Smokeless tobacco: Former  Scientific laboratory technician Use: Never used  Substance and Sexual Activity   Alcohol use: Yes    Comment: occ   Drug use: No   Sexual activity: Not on file  Other Topics Concern   Not on file  Social History Narrative   Not on file   Social Determinants of Health   Financial Resource Strain: Not on file  Food Insecurity: Not on file  Transportation Needs: Not on file  Physical Activity: Not on file  Stress: Not on file  Social Connections: Not on file  Intimate Partner Violence: Not on file    Outpatient Medications Prior to Visit  Medication Sig Dispense Refill   atorvastatin (LIPITOR) 40 MG tablet Take 1 tablet (40 mg total) by mouth daily. 90 tablet 3   nitroGLYCERIN (NITROSTAT) 0.4 MG SL tablet Place 1 tablet (0.4 mg total) under the tongue every 5 (five) minutes as needed for chest pain. 25 tablet 3  predniSONE (DELTASONE) 20 MG tablet 2 po once a day for 3 days, then 1 po once a day for 3 days 9 tablet 0   tamsulosin (FLOMAX) 0.4 MG CAPS capsule TAKE 1 CAPSULE(0.4 MG) BY MOUTH DAILY 90 capsule 0   clopidogrel (PLAVIX) 75 MG tablet TAKE 1 TABLET(75 MG) BY MOUTH DAILY WITH BREAKFAST 90 tablet 1   pantoprazole (PROTONIX) 40 MG tablet Take 1 tablet (40 mg total) by mouth daily. 30 tablet 6   sulfamethoxazole-trimethoprim (BACTRIM DS) 800-160 MG tablet Take 1 tablet by mouth 2 (two) times daily. (Patient not taking: Reported on 01/02/2022) 28 tablet 0   No facility-administered medications prior to visit.    Allergies  Allergen Reactions   Penicillins Hives    Did it involve swelling of the face/tongue/throat, SOB, or low BP? No Did it involve sudden or  severe rash/hives, skin peeling, or any reaction on the inside of your mouth or nose? No Did you need to seek medical attention at a hospital or doctor's office? No When did it last happen?      35+ years If all above answers are NO, may proceed with cephalosporin use.     ROS Review of Systems  Constitutional: Negative.   HENT: Negative.    Eyes: Negative.   Respiratory: Negative.    Cardiovascular: Negative.   Gastrointestinal: Negative.   Genitourinary: Negative.   Musculoskeletal:  Positive for arthralgias.  Skin: Negative.   Neurological: Negative.   Psychiatric/Behavioral: Negative.    All other systems reviewed and are negative.    Objective:    Physical Exam Constitutional:      General: He is not in acute distress.    Appearance: Normal appearance. He is normal weight. He is not ill-appearing, toxic-appearing or diaphoretic.  Cardiovascular:     Rate and Rhythm: Normal rate and regular rhythm.     Heart sounds: Normal heart sounds. No murmur heard.   No friction rub. No gallop.  Pulmonary:     Effort: Pulmonary effort is normal. No respiratory distress.     Breath sounds: Normal breath sounds. No stridor. No wheezing, rhonchi or rales.  Chest:     Chest wall: No tenderness.  Musculoskeletal:        General: Tenderness (joint line L shoulder) present. No swelling, deformity or signs of injury.     Right lower leg: No edema.     Left lower leg: No edema.  Neurological:     General: No focal deficit present.     Mental Status: He is alert and oriented to person, place, and time. Mental status is at baseline.  Psychiatric:        Mood and Affect: Mood normal.        Behavior: Behavior normal.        Thought Content: Thought content normal.        Judgment: Judgment normal.    BP 139/81    Pulse (!) 55    Temp 97.8 F (36.6 C) (Temporal)    Resp 18    Ht _0  (1.803 m)    Wt 193 lb (87.5 kg)    SpO2 99%    BMI 26.92 kg/m  Wt Readings from Last 3  Encounters:  01/02/22 193 lb (87.5 kg)  04/23/21 189 lb 6.4 oz (85.9 kg)  03/06/21 187 lb 3.2 oz (84.9 kg)     Health Maintenance Due  Topic Date Due   URINE MICROALBUMIN  Never done   Zoster Vaccines-  Shingrix (1 of 2) Never done   Pneumonia Vaccine 96+ Years old (1 - PCV) Never done   COVID-19 Vaccine (3 - Booster for Pfizer series) 03/18/2020   INFLUENZA VACCINE  Never done    There are no preventive care reminders to display for this patient.  Lab Results  Component Value Date   TSH 1.85 04/23/2021   Lab Results  Component Value Date   WBC 13.8 (H) 04/09/2021   HGB 14.5 04/09/2021   HCT 43.8 04/09/2021   MCV 86 04/09/2021   PLT 309 04/09/2021   Lab Results  Component Value Date   NA 142 01/17/2021   K 4.1 01/17/2021   CO2 21 01/17/2021   GLUCOSE 93 01/17/2021   BUN 16 01/17/2021   CREATININE 1.25 01/17/2021   BILITOT 0.5 04/09/2021   ALKPHOS 146 (H) 04/09/2021   AST 12 04/09/2021   ALT 27 04/09/2021   PROT 7.5 04/09/2021   ALBUMIN 4.7 04/09/2021   CALCIUM 9.5 01/17/2021   ANIONGAP 10 12/22/2019   EGFR 63 01/17/2021   Lab Results  Component Value Date   CHOL 167 04/09/2021   Lab Results  Component Value Date   HDL 85 04/09/2021   Lab Results  Component Value Date   LDLCALC 69 04/09/2021   Lab Results  Component Value Date   TRIG 68 04/09/2021   Lab Results  Component Value Date   CHOLHDL 2.0 04/09/2021   Lab Results  Component Value Date   HGBA1C 6.8 (H) 01/17/2021      Assessment & Plan:   Problem List Items Addressed This Visit       Cardiovascular and Mediastinum   Coronary artery disease   Relevant Medications   clopidogrel (PLAVIX) 75 MG tablet   Other Visit Diagnoses     Chronic pain in left shoulder    -  Primary   Relevant Medications   methocarbamol (ROBAXIN) 500 MG tablet   Other Relevant Orders   DG Shoulder Left   Gastroesophageal reflux disease without esophagitis       Relevant Medications   pantoprazole  (PROTONIX) 40 MG tablet   Type 2 diabetes mellitus without complication, without long-term current use of insulin (HCC)       Relevant Orders   CBC with Differential/Platelet   Comprehensive metabolic panel   Hemoglobin A1c       Meds ordered this encounter  Medications   clopidogrel (PLAVIX) 75 MG tablet    Sig: TAKE 1 TABLET(75 MG) BY MOUTH DAILY WITH BREAKFAST    Dispense:  90 tablet    Refill:  1   pantoprazole (PROTONIX) 40 MG tablet    Sig: Take 1 tablet (40 mg total) by mouth daily.    Dispense:  90 tablet    Refill:  3   methocarbamol (ROBAXIN) 500 MG tablet    Sig: Take 1 tablet (500 mg total) by mouth every 8 (eight) hours as needed for muscle spasms.    Dispense:  60 tablet    Refill:  0    Order Specific Question:   Supervising Provider    Answer:   Carlota Raspberry, JEFFREY R [[id number]]    Follow-up: Return if symptoms worsen or fail to improve.   PLAN Methocarbamol for shoulder pain. Will obtain xray and consider next steps.  Refill pantoprazole and clopidogrel Labs collected. Will follow up with the patient as warranted. Patient encouraged to call clinic with any questions, comments, or concerns.  Maximiano Coss, NP

## 2022-01-07 ENCOUNTER — Ambulatory Visit (HOSPITAL_COMMUNITY)
Admission: RE | Admit: 2022-01-07 | Discharge: 2022-01-07 | Disposition: A | Discharge status: Home / Self Care | Payer: Medicare HMO | Source: Ambulatory Visit | Attending: Surgery | Admitting: Surgery

## 2022-01-07 ENCOUNTER — Other Ambulatory Visit: Payer: Self-pay

## 2022-01-07 DIAGNOSIS — R1310 Dysphagia, unspecified: Secondary | ICD-10-CM | POA: Insufficient documentation

## 2022-01-07 DIAGNOSIS — E049 Nontoxic goiter, unspecified: Secondary | ICD-10-CM | POA: Insufficient documentation

## 2022-01-08 NOTE — Progress Notes (Signed)
Modified Barium Swallow Progress Note ? ?Patient Details  ?Name: Peter Taylor ?MRN: [id number] ?Date of Birth: Jul 16, 1951 ? ?Today's Date: 01/08/2022 ? ?Modified Barium Swallow completed.  Full report located under Chart Review in the Imaging Section. ? ?Brief recommendations include the following: ? ?Clinical Impression ?  Pt seen for outpatient MBS with question of cervical osteophyte or rigidity in vertebrae upon appearance (MBS does not diagnose cervical vertebrae). Oral and pharyngeal phases of swallow were within normal limits. Timing of laryngeal closure, onset swallowing tongue base retraction and hyolaryngeal elevation were functional without penetration, aspiration or residue. Pill remained in oral cavity with thin barium presentation however spontaneous second swallow cleared and esophageal scan was unremarkable (MBS does not dx below level of UES). Aspiration risk appeared low per study. Verbal and written instructions provided re: esophageal precautions. Recommend continue regular texture, thin liquids and precautions for GER. ?  ?Swallow Evaluation Recommendations ? ?   ? ?  Recommend continue regular texture, thin liquids, pills with water, precautions for GER (verbal and handout given) ? ?   ? ?   ? ?   ? ?   ? ?   ? ?   ? ?   ? ? ? ?Peter Taylor ?01/08/2022,1:12 PM ?

## 2022-01-12 NOTE — Progress Notes (Signed)
Swallow study results and recommendations noted. ? ?Plan surgical follow up for thyroid nodule as scheduled. ? ?Armandina Gemma, MD ?[facility] ?A [facility] practice ?Office: [phone] ? ?

## 2022-01-14 ENCOUNTER — Other Ambulatory Visit: Payer: Self-pay

## 2022-01-14 ENCOUNTER — Ambulatory Visit
Admission: RE | Admit: 2022-01-14 | Discharge: 2022-01-14 | Disposition: A | Discharge status: Home / Self Care | Payer: Medicare HMO | Source: Ambulatory Visit | Attending: Registered Nurse | Admitting: Registered Nurse

## 2022-01-14 DIAGNOSIS — G8929 Other chronic pain: Secondary | ICD-10-CM

## 2022-01-14 DIAGNOSIS — M25512 Pain in left shoulder: Secondary | ICD-10-CM | POA: Diagnosis not present

## 2022-01-15 ENCOUNTER — Other Ambulatory Visit: Payer: Self-pay | Admitting: Family Medicine

## 2022-01-27 ENCOUNTER — Other Ambulatory Visit: Payer: Self-pay | Admitting: Interventional Cardiology

## 2022-02-18 ENCOUNTER — Other Ambulatory Visit: Payer: Self-pay | Admitting: Family Medicine

## 2022-03-03 NOTE — Progress Notes (Signed)
?  ?Cardiology Office Note ? ? ?Date:  03/05/2022  ? ?ID:  Peter Taylor, DOB 21-Apr-1951, MRN [id number] ? ?PCP:  Wendie Agreste, MD  ? ? ?No chief complaint on file. ? ? ? ?Wt Readings from Last 3 Encounters:  ?03/05/22 191 lb 12.8 oz (87 kg)  ?01/02/22 193 lb (87.5 kg)  ?04/23/21 189 lb 6.4 oz (85.9 kg)  ?  ? ?  ?History of Present Illness: ?Peter Taylor is a 71 y.o. male   With CAD. ?  ?Cath in 11/2019 showed: ?"Mid LAD lesion is 90% stenosed. ?A drug-eluting stent was successfully placed using a STENT RESOLUTE ONYX 3.0X22. ?Post intervention, there is a 0% residual stenosis. ?The left ventricular systolic function is normal. ?LV end diastolic pressure is normal. ?The left ventricular ejection fraction is 55-65% by visual estimate. ?There is no aortic valve stenosis. ?Mild, nonobstructive disease in the RCA and circumflex. ?  ?Continue dual antiplatelet therapy along with aggressive secondary prevention. " ?  ?Works as a Administrator. ?  ?After PCI, it was noted : "Cardiac rehab may not work with his work schedule. He is going to join a gym.  He may try to stop his Imdur when the supply runs out.  If no CP, he can stay off the medicine. ? He reported occasional belching.  This has persisted despite protonix." ?  ?He did get his COVID shots and got his booster. ? ?Thyroid biopsy was negative per his report. ? ?Denies : Chest pain. Dizziness. Leg edema. Nitroglycerin use. Orthopnea. Palpitations. Paroxysmal nocturnal dyspnea. Shortness of breath. Syncope.   ? 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  ? ?Walks a little.   ? ? ? ?Past Medical History:  ?Diagnosis Date  ? CAD (coronary artery disease), native coronary artery   ? a. Cath 12/10/19: 90% mLAD s/p DES, mild non obstructive disease in RCA and Cx  ? GERD (gastroesophageal reflux disease)   ? Hyperlipidemia   ? Substance abuse ([place])   ? Tuberculosis   ? ? ?Past Surgical History:  ?Procedure Laterality Date  ? CORONARY STENT  INTERVENTION N/A 12/10/2019  ? Procedure: CORONARY STENT INTERVENTION;  Surgeon: Jettie Booze, MD;  Location: [place] [facility];  Service: Cardiovascular;  Laterality: N/A;  ? LEFT HEART CATH AND CORONARY ANGIOGRAPHY N/A 12/10/2019  ? Procedure: LEFT HEART CATH AND CORONARY ANGIOGRAPHY;  Surgeon: Jettie Booze, MD;  Location: [place] [facility];  Service: Cardiovascular;  Laterality: N/A;  ? NO PAST SURGERIES    ? ? ? ?Current Outpatient Medications  ?Medication Sig Dispense Refill  ? atorvastatin (LIPITOR) 40 MG tablet TAKE 1 TABLET(40 MG) BY MOUTH DAILY 90 tablet 0  ? clopidogrel (PLAVIX) 75 MG tablet TAKE 1 TABLET(75 MG) BY MOUTH DAILY WITH BREAKFAST 90 tablet 1  ? nitroGLYCERIN (NITROSTAT) 0.4 MG SL tablet Place 1 tablet (0.4 mg total) under the tongue every 5 (five) minutes as needed for chest pain. 25 tablet 3  ? sulfamethoxazole-trimethoprim (BACTRIM DS) 800-160 MG tablet Take 1 tablet by mouth 2 (two) times daily. 28 tablet 0  ? tamsulosin (FLOMAX) 0.4 MG CAPS capsule TAKE 1 CAPSULE(0.4 MG) BY MOUTH DAILY 90 capsule 0  ? ?No current facility-administered medications for this visit.  ? ? ?Allergies:   Penicillins  ? ? ?Social History:  The patient  reports that he has quit smoking. He has quit using smokeless tobacco. He reports current alcohol use. He reports that he does  not use drugs.  ? ?Family History:  The patient's family history includes Cancer in his brother and mother; Ovarian cancer in his sister, sister, and sister.  ? ? ?ROS:  Please see the history of present illness.   Otherwise, review of systems are positive for arthritis pain- did not improve with prednisone.   All other systems are reviewed and negative.  ? ? ?PHYSICAL EXAM: ?VS:  BP 126/72   Pulse (!) 57   Ht '5\' 11"'$  (1.803 m)   Wt 191 lb 12.8 oz (87 kg)   SpO2 96%   BMI 26.75 kg/m?  , BMI Body mass index is 26.75 kg/m?. ?GEN: Well nourished, well developed, in no acute distress ?HEENT: normal ?Neck: no JVD, carotid  bruits, or masses ?Cardiac: RRR; no murmurs, rubs, or gallops,no edema  ?Respiratory:  clear to auscultation bilaterally, normal work of breathing ?GI: soft, nontender, nondistended, + BS ?MS: no deformity or atrophy ?Skin: warm and dry, no rash ?Neuro:  Strength and sensation are intact ?Psych: euthymic mood, full affect ? ? ?EKG:   ?The ekg ordered today demonstrates NSR, no ST changes ? ? ?Recent Labs: ?04/23/2021: TSH 1.85 ?01/02/2022: ALT 29; BUN 18; Creatinine, Ser 1.19; Hemoglobin 14.0; Platelets 230.0; Potassium 3.8; Sodium 141  ? ?Lipid Panel ?   ?Component Value Date/Time  ? CHOL 167 04/09/2021 1019  ? TRIG 68 04/09/2021 1019  ? HDL 85 04/09/2021 1019  ? CHOLHDL 2.0 04/09/2021 1019  ? [place] 69 04/09/2021 1019  ? ?  ?Other studies Reviewed: ?Additional studies/ records that were reviewed today with results demonstrating: June 2022 total cholesterol 167 HDL 85 LDL 69 triglycerides 68 A1c 7.1 creatinine 1.1. ? ? ?ASSESSMENT AND PLAN: ? ?CAD: s/p PCI LAD.  Continue aggressive secondary prevention. No angina on medical therapy.  No bleeding problems.  Increase exercise to target noted below. ?Hyperlipidemia: Whole food, plant based diet.  Continue high-dose statin. ?Headaches:  rare.  Improved.  ?GERD: rare. IF he eats certain foods.  ?Borderline DM: High-fiber diet.  Avoid processed foods.  Not taking medications.  Would have to consider LDL target of 55 in the future if his blood sugar gets worse. 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  Form filled out. ? ? ?Current medicines are reviewed at length with the patient today.  The patient concerns regarding his medicines were addressed. ? ?The following changes have been made:  No change ? ?Labs/ tests ordered today include:  ?No orders of the defined types were placed in this encounter. ? ? ?Recommend 150 minutes/week of aerobic exercise ?Low fat, low carb, high fiber diet recommended ? ?Disposition:   FU in 1 year ? ? ?Signed, ?Larae Grooms,  MD  ?03/05/2022 8:34 AM    ?[place] ?[address], [place], [place]  [address] ?Phone: [phone]; Fax: [phone]  ? ?

## 2022-03-05 ENCOUNTER — Encounter: Payer: Self-pay | Admitting: Interventional Cardiology

## 2022-03-05 ENCOUNTER — Ambulatory Visit: Payer: Medicare HMO | Admitting: Interventional Cardiology

## 2022-03-05 VITALS — BP 126/72 | HR 57 | Ht 71.0 in | Wt 191.8 lb

## 2022-03-05 DIAGNOSIS — E782 Mixed hyperlipidemia: Secondary | ICD-10-CM | POA: Diagnosis not present

## 2022-03-05 DIAGNOSIS — K219 Gastro-esophageal reflux disease without esophagitis: Secondary | ICD-10-CM

## 2022-03-05 DIAGNOSIS — I2510 Atherosclerotic heart disease of native coronary artery without angina pectoris: Secondary | ICD-10-CM

## 2022-03-05 DIAGNOSIS — Z95820 Peripheral vascular angioplasty status with implants and grafts: Secondary | ICD-10-CM

## 2022-03-05 NOTE — Patient Instructions (Signed)

## 2022-04-18 ENCOUNTER — Ambulatory Visit (INDEPENDENT_AMBULATORY_CARE_PROVIDER_SITE_OTHER): Payer: Medicare HMO

## 2022-04-18 DIAGNOSIS — Z0100 Encounter for examination of eyes and vision without abnormal findings: Secondary | ICD-10-CM | POA: Diagnosis not present

## 2022-04-18 DIAGNOSIS — Z0000 Encounter for general adult medical examination without abnormal findings: Secondary | ICD-10-CM | POA: Diagnosis not present

## 2022-04-18 NOTE — Patient Instructions (Signed)
Peter Taylor , Thank you for taking time to come for your Medicare Wellness Visit. I appreciate your ongoing commitment to your health goals. Please review the following plan we discussed and let me know if I can assist you in the future.   Screening recommendations/referrals: Colonoscopy: 12/07/2019 Recommended yearly ophthalmology/optometry visit for glaucoma screening and checkup Recommended yearly dental visit for hygiene and checkup  Vaccinations: Influenza vaccine: declined  Pneumococcal vaccine: due  Tdap vaccine: due  Shingles vaccine: will consider     Advanced directives: none   Conditions/risks identified: none   Next appointment: none   Preventive Care 14 Years and Older, Male Preventive care refers to lifestyle choices and visits with your health care provider that can promote health and wellness. What does preventive care include? A yearly physical exam. This is also called an annual well check. Dental exams once or twice a year. Routine eye exams. Ask your health care provider how often you should have your eyes checked. Personal lifestyle choices, including: Daily care of your teeth and gums. Regular physical activity. Eating a healthy diet. Avoiding tobacco and drug use. Limiting alcohol use. Practicing safe sex. Taking low doses of aspirin every day. Taking vitamin and mineral supplements as recommended by your health care provider. What happens during an annual well check? The services and screenings done by your health care provider during your annual well check will depend on your age, overall health, lifestyle risk factors, and family history of disease. Counseling  Your health care provider may ask you questions about your: Alcohol use. Tobacco use. Drug use. Emotional well-being. Home and relationship well-being. Sexual activity. Eating habits. History of falls. Memory and ability to understand (cognition). Work and work Statistician. Screening   You may have the following tests or measurements: Height, weight, and BMI. Blood pressure. Lipid and cholesterol levels. These may be checked every 5 years, or more frequently if you are over 68 years old. Skin check. Lung cancer screening. You may have this screening every year starting at age 31 if you have a 30-pack-year history of smoking and currently smoke or have quit within the past 15 years. Fecal occult blood test (FOBT) of the stool. You may have this test every year starting at age 30. Flexible sigmoidoscopy or colonoscopy. You may have a sigmoidoscopy every 5 years or a colonoscopy every 10 years starting at age 16. Prostate cancer screening. Recommendations will vary depending on your family history and other risks. Hepatitis C blood test. Hepatitis B blood test. Sexually transmitted disease (STD) testing. Diabetes screening. This is done by checking your blood sugar (glucose) after you have not eaten for a while (fasting). You may have this done every 1-3 years. Abdominal aortic aneurysm (AAA) screening. You may need this if you are a current or former smoker. Osteoporosis. You may be screened starting at age 24 if you are at high risk. Talk with your health care provider about your test results, treatment options, and if necessary, the need for more tests. Vaccines  Your health care provider may recommend certain vaccines, such as: Influenza vaccine. This is recommended every year. Tetanus, diphtheria, and acellular pertussis (Tdap, Td) vaccine. You may need a Td booster every 10 years. Zoster vaccine. You may need this after age 28. Pneumococcal 13-valent conjugate (PCV13) vaccine. One dose is recommended after age 60. Pneumococcal polysaccharide (PPSV23) vaccine. One dose is recommended after age 23. Talk to your health care provider about which screenings and vaccines you need and how often  you need them. This information is not intended to replace advice given to you by  your health care provider. Make sure you discuss any questions you have with your health care provider. Document Released: 11/10/2015 Document Revised: 07/03/2016 Document Reviewed: 08/15/2015 Elsevier Interactive Patient Education  2017 [place] Prevention in the Home Falls can cause injuries. They can happen to people of all ages. There are many things you can do to make your home safe and to help prevent falls. What can I do on the outside of my home? Regularly fix the edges of walkways and driveways and fix any cracks. Remove anything that might make you trip as you walk through a door, such as a raised step or threshold. Trim any bushes or trees on the path to your home. Use bright outdoor lighting. Clear any walking paths of anything that might make someone trip, such as rocks or tools. Regularly check to see if handrails are loose or broken. Make sure that both sides of any steps have handrails. Any raised decks and porches should have guardrails on the edges. Have any leaves, snow, or ice cleared regularly. Use sand or salt on walking paths during winter. Clean up any spills in your garage right away. This includes oil or grease spills. What can I do in the bathroom? Use night lights. Install grab bars by the toilet and in the tub and shower. Do not use towel bars as grab bars. Use non-skid mats or decals in the tub or shower. If you need to sit down in the shower, use a plastic, non-slip stool. Keep the floor dry. Clean up any water that spills on the floor as soon as it happens. Remove soap buildup in the tub or shower regularly. Attach bath mats securely with double-sided non-slip rug tape. Do not have throw rugs and other things on the floor that can make you trip. What can I do in the bedroom? Use night lights. Make sure that you have a light by your bed that is easy to reach. Do not use any sheets or blankets that are too big for your bed. They should not hang  down onto the floor. Have a firm chair that has side arms. You can use this for support while you get dressed. Do not have throw rugs and other things on the floor that can make you trip. What can I do in the kitchen? Clean up any spills right away. Avoid walking on wet floors. Keep items that you use a lot in easy-to-reach places. If you need to reach something above you, use a strong step stool that has a grab bar. Keep electrical cords out of the way. Do not use floor polish or wax that makes floors slippery. If you must use wax, use non-skid floor wax. Do not have throw rugs and other things on the floor that can make you trip. What can I do with my stairs? Do not leave any items on the stairs. Make sure that there are handrails on both sides of the stairs and use them. Fix handrails that are broken or loose. Make sure that handrails are as long as the stairways. Check any carpeting to make sure that it is firmly attached to the stairs. Fix any carpet that is loose or worn. Avoid having throw rugs at the top or bottom of the stairs. If you do have throw rugs, attach them to the floor with carpet tape. Make sure that you have a light  switch at the top of the stairs and the bottom of the stairs. If you do not have them, ask someone to add them for you. What else can I do to help prevent falls? Wear shoes that: Do not have high heels. Have rubber bottoms. Are comfortable and fit you well. Are closed at the toe. Do not wear sandals. If you use a stepladder: Make sure that it is fully opened. Do not climb a closed stepladder. Make sure that both sides of the stepladder are locked into place. Ask someone to hold it for you, if possible. Clearly mark and make sure that you can see: Any grab bars or handrails. First and last steps. Where the edge of each step is. Use tools that help you move around (mobility aids) if they are needed. These  include: Canes. Walkers. Scooters. Crutches. Turn on the lights when you go into a dark area. Replace any light bulbs as soon as they burn out. Set up your furniture so you have a clear path. Avoid moving your furniture around. If any of your floors are uneven, fix them. If there are any pets around you, be aware of where they are. Review your medicines with your doctor. Some medicines can make you feel dizzy. This can increase your chance of falling. Ask your doctor what other things that you can do to help prevent falls. This information is not intended to replace advice given to you by your health care provider. Make sure you discuss any questions you have with your health care provider. Document Released: 08/10/2009 Document Revised: 03/21/2016 Document Reviewed: 11/18/2014 Elsevier Interactive Patient Education  2017 [employer].

## 2022-06-14 ENCOUNTER — Other Ambulatory Visit: Payer: Self-pay | Admitting: Interventional Cardiology

## 2022-06-14 ENCOUNTER — Other Ambulatory Visit: Payer: Self-pay | Admitting: *Deleted

## 2022-06-14 ENCOUNTER — Encounter: Payer: Self-pay | Admitting: *Deleted

## 2022-06-14 NOTE — Patient Instructions (Signed)
Visit Information  Thank you for taking time to visit with me today. Please don't hesitate to contact me if I can be of assistance to you.   Following are the goals we discussed today:   Goals Addressed               This Visit's Progress     No needs (pt-stated)        Care Coordination Interventions: Provided education to patient and/or caregiver about advanced directives Reviewed medications with patient and discussed purpose of all medications Reviewed scheduled/upcoming provider appointments including pending appointments and verified recent AWV on 04/18/2022 with primary provider. Screening for signs and symptoms of depression related to chronic disease state  Assessed social determinant of health barriers            Please call the care guide team at [phone] if you need to cancel or reschedule your appointment.   If you are experiencing a Mental Health or [place] or need someone to talk to, please call the Suicide and Crisis Lifeline: 988  Patient verbalizes understanding of instructions and care plan provided today and agrees to view in [place]. Active MyChart status and patient understanding of how to access instructions and care plan via MyChart confirmed with patient.     No further follow up required: No needs    Raina Mina, RN Care Management Coordinator [place] Office [phone]

## 2022-06-14 NOTE — Patient Outreach (Signed)
  Care Coordination   Initial Visit Note   06/14/2022 Name: Peter Taylor MRN: [id number] DOB: [birth year]  Peter Taylor is a 71 y.o. year old male who sees Peter Agreste, MD for primary care. I spoke with  Peter Taylor by phone today  What matters to the patients health and wellness today?  No needs    Goals Addressed               This Visit's Progress     No needs (pt-stated)        Care Coordination Interventions: Provided education to patient and/or caregiver about advanced directives Reviewed medications with patient and discussed purpose of all medications Reviewed scheduled/upcoming provider appointments including pending appointments and verified recent AWV on 04/18/2022 with primary provider. Screening for signs and symptoms of depression related to chronic disease state  Assessed social determinant of health barriers          SDOH assessments and interventions completed:  Yes  SDOH Interventions Today    Flowsheet Row Most Recent Value  SDOH Interventions   Food Insecurity Interventions Intervention Not Indicated  Housing Interventions Intervention Not Indicated  Transportation Interventions Intervention Not Indicated        Care Coordination Interventions Activated:  Yes  Care Coordination Interventions:  Yes, provided   Follow up plan: No further intervention required.   Encounter Outcome:  Pt. Visit Completed   Peter Mina, RN Care Management Coordinator [place] Office [phone]

## 2022-06-26 ENCOUNTER — Other Ambulatory Visit: Payer: Self-pay | Admitting: Family Medicine

## 2022-09-11 ENCOUNTER — Other Ambulatory Visit: Payer: Self-pay | Admitting: Lab

## 2022-09-11 DIAGNOSIS — I25810 Atherosclerosis of coronary artery bypass graft(s) without angina pectoris: Secondary | ICD-10-CM

## 2022-09-11 MED ORDER — CLOPIDOGREL BISULFATE 75 MG PO TABS: ORAL_TABLET | ORAL | 1 refills | Status: DC

## 2022-09-25 ENCOUNTER — Other Ambulatory Visit: Payer: Self-pay | Admitting: Family Medicine

## 2022-11-29 ENCOUNTER — Other Ambulatory Visit: Payer: Self-pay | Admitting: Surgery

## 2022-11-29 DIAGNOSIS — E049 Nontoxic goiter, unspecified: Secondary | ICD-10-CM

## 2022-11-29 DIAGNOSIS — E042 Nontoxic multinodular goiter: Secondary | ICD-10-CM

## 2022-12-20 ENCOUNTER — Ambulatory Visit
Admission: RE | Admit: 2022-12-20 | Discharge: 2022-12-20 | Disposition: A | Discharge status: Home / Self Care | Payer: Medicare HMO | Source: Ambulatory Visit | Attending: Surgery | Admitting: Surgery

## 2022-12-20 DIAGNOSIS — E049 Nontoxic goiter, unspecified: Secondary | ICD-10-CM

## 2022-12-20 DIAGNOSIS — E010 Iodine-deficiency related diffuse (endemic) goiter: Secondary | ICD-10-CM | POA: Diagnosis not present

## 2022-12-20 DIAGNOSIS — E042 Nontoxic multinodular goiter: Secondary | ICD-10-CM | POA: Diagnosis not present

## 2022-12-26 ENCOUNTER — Other Ambulatory Visit: Payer: Self-pay | Admitting: Family Medicine

## 2022-12-26 DIAGNOSIS — E042 Nontoxic multinodular goiter: Secondary | ICD-10-CM | POA: Diagnosis not present

## 2022-12-27 ENCOUNTER — Other Ambulatory Visit: Payer: Self-pay | Admitting: Surgery

## 2022-12-27 DIAGNOSIS — E041 Nontoxic single thyroid nodule: Secondary | ICD-10-CM

## 2022-12-31 DIAGNOSIS — E042 Nontoxic multinodular goiter: Secondary | ICD-10-CM | POA: Diagnosis not present

## 2022-12-31 DIAGNOSIS — E049 Nontoxic goiter, unspecified: Secondary | ICD-10-CM | POA: Diagnosis not present

## 2023-01-10 ENCOUNTER — Ambulatory Visit: Payer: Medicare PPO | Admitting: Family Medicine

## 2023-01-15 ENCOUNTER — Other Ambulatory Visit (HOSPITAL_COMMUNITY)
Admission: RE | Admit: 2023-01-15 | Discharge: 2023-01-15 | Disposition: A | Discharge status: Home / Self Care | Payer: Medicare PPO | Source: Ambulatory Visit | Attending: Surgery | Admitting: Surgery

## 2023-01-15 ENCOUNTER — Ambulatory Visit
Admission: RE | Admit: 2023-01-15 | Discharge: 2023-01-15 | Disposition: A | Discharge status: Home / Self Care | Payer: Medicare PPO | Source: Ambulatory Visit | Attending: Surgery | Admitting: Surgery

## 2023-01-15 DIAGNOSIS — E041 Nontoxic single thyroid nodule: Secondary | ICD-10-CM | POA: Insufficient documentation

## 2023-01-15 NOTE — Procedures (Signed)
Successful US guided FNA of left mid thyroid nodule No complications. See PACS for full report.    Narda Rutherford, AGNP-BC 01/15/2023, 4:42 PM

## 2023-01-16 ENCOUNTER — Encounter: Payer: Self-pay | Admitting: Family Medicine

## 2023-01-16 ENCOUNTER — Ambulatory Visit (INDEPENDENT_AMBULATORY_CARE_PROVIDER_SITE_OTHER): Payer: Medicare PPO | Admitting: Family Medicine

## 2023-01-16 VITALS — BP 122/70 | HR 66 | Temp 98.2°F | Ht 71.0 in | Wt 195.4 lb

## 2023-01-16 DIAGNOSIS — R972 Elevated prostate specific antigen [PSA]: Secondary | ICD-10-CM | POA: Diagnosis not present

## 2023-01-16 DIAGNOSIS — E119 Type 2 diabetes mellitus without complications: Secondary | ICD-10-CM | POA: Diagnosis not present

## 2023-01-16 DIAGNOSIS — K219 Gastro-esophageal reflux disease without esophagitis: Secondary | ICD-10-CM

## 2023-01-16 DIAGNOSIS — E041 Nontoxic single thyroid nodule: Secondary | ICD-10-CM | POA: Insufficient documentation

## 2023-01-16 DIAGNOSIS — I25810 Atherosclerosis of coronary artery bypass graft(s) without angina pectoris: Secondary | ICD-10-CM | POA: Diagnosis not present

## 2023-01-16 DIAGNOSIS — R12 Heartburn: Secondary | ICD-10-CM

## 2023-01-16 DIAGNOSIS — N400 Enlarged prostate without lower urinary tract symptoms: Secondary | ICD-10-CM | POA: Diagnosis not present

## 2023-01-16 LAB — COMPREHENSIVE METABOLIC PANEL
ALT: 26 U/L (ref 0–53)
AST: 15 U/L (ref 0–37)
Albumin: 4.6 g/dL (ref 3.5–5.2)
Alkaline Phosphatase: 129 U/L — ABNORMAL HIGH (ref 39–117)
BUN: 18 mg/dL (ref 6–23)
CO2: 28 mEq/L (ref 19–32)
Calcium: 9.6 mg/dL (ref 8.4–10.5)
Chloride: 106 mEq/L (ref 96–112)
Creatinine, Ser: 1.27 mg/dL (ref 0.40–1.50)
GFR: 57.64 mL/min — ABNORMAL LOW (ref >60.00)
Glucose, Bld: 106 mg/dL — ABNORMAL HIGH (ref 70–99)
Potassium: 3.9 mEq/L (ref 3.5–5.1)
Sodium: 142 mEq/L (ref 135–145)
Total Bilirubin: 0.6 mg/dL (ref 0.2–1.2)
Total Protein: 7.6 g/dL (ref 6.0–8.3)

## 2023-01-16 LAB — LIPID PANEL
Cholesterol: 143 mg/dL (ref 0–200)
HDL: 58.5 mg/dL (ref >39.00)
LDL Cholesterol: 63 mg/dL (ref 0–99)
NonHDL: 84.4
Total CHOL/HDL Ratio: 2
Triglycerides: 108 mg/dL (ref 0.0–149.0)
VLDL: 21.6 mg/dL (ref 0.0–40.0)

## 2023-01-16 LAB — MICROALBUMIN / CREATININE URINE RATIO
Creatinine,U: 172.5 mg/dL
Microalb Creat Ratio: 0.6 mg/g (ref 0.0–30.0)
Microalb, Ur: 1.1 mg/dL (ref 0.0–1.9)

## 2023-01-16 LAB — PSA: PSA: 6.05 ng/mL — ABNORMAL HIGH (ref 0.10–4.00)

## 2023-01-16 LAB — HEMOGLOBIN A1C: Hgb A1c MFr Bld: 7 % — ABNORMAL HIGH (ref 4.6–6.5)

## 2023-01-16 MED ORDER — CLOPIDOGREL BISULFATE 75 MG PO TABS: ORAL_TABLET | ORAL | 1 refills | Status: DC

## 2023-01-16 MED ORDER — PANTOPRAZOLE SODIUM 40 MG PO TBEC: 40.0000 mg | DELAYED_RELEASE_TABLET | Freq: Every day | ORAL | 2 refills | Status: DC

## 2023-01-16 MED ORDER — TAMSULOSIN HCL 0.4 MG PO CAPS: ORAL_CAPSULE | ORAL | 1 refills | Status: DC

## 2023-01-16 MED ORDER — ATORVASTATIN CALCIUM 40 MG PO TABS: ORAL_TABLET | ORAL | 2 refills | Status: DC

## 2023-01-16 NOTE — Patient Instructions (Addendum)
I will check some labs today to see if any med changes needed. Follow up 3 weeks to review labs/plan.  I will refer you to eye doctor.  I will check PSA today, but call urologist to schedule follow up as 6 months were recommended last February.   Dr.Matthew Junious Silk, Urologist Address: [address], [place], [place] [address] Phone: [phone]  Continue protonix daily. Avoid trigger foods below for heartburn and if continued belching or heartburn. If not improving by next visit I can refer you to gastroenterologist.   Return to the clinic or go to the nearest emergency room if any of your symptoms worsen or new symptoms occur.  Thanks for coming in today!  Type 2 Diabetes Mellitus, Self-Care, Adult Caring for yourself after you have been diagnosed with type 2 diabetes (type 2 diabetes mellitus) means keeping your blood sugar (glucose) under control with a balance of: Nutrition. Exercise. Lifestyle changes. Medicines or insulin, if needed. Support from your team of health care providers and others. What are the risks? Having type 2 diabetes can put you at risk for other long-term (chronic) conditions, such as heart disease and kidney disease. Your health care provider may prescribe medicines to help prevent complications from diabetes. How to monitor your blood glucose  Check your blood glucose every day or as often as told by your health care provider. Have your A1C (hemoglobin A1C) level checked two or more times a year, or as often as told by your health care provider. Your health care provider will set personalized treatment goals for you. Generally, the goal of treatment is to maintain the following blood glucose levels: Before meals: 80-130 mg/dL (4.4-7.2 mmol/L). After meals: below 180 mg/dL (10 mmol/L). A1C level: less than 7%. How to manage hyperglycemia and hypoglycemia Hyperglycemia symptoms Hyperglycemia, also called high blood glucose, occurs when blood  glucose is too high. Make sure you know the early signs of hyperglycemia, such as: Increased thirst. Hunger. Feeling very tired. Needing to urinate more often than usual. Blurry vision. Hypoglycemia symptoms Hypoglycemia, also called low blood glucose, occurs with a blood glucose level at or below 70 mg/dL (3.9 mmol/L). Diabetes medicines lower your blood glucose and can cause hypoglycemia. The risk for hypoglycemia increases during or after exercise, during sleep, during illness, and when skipping meals or not eating for a long time (fasting). It is important to know the symptoms of hypoglycemia and treat it right away. Always have a 15-gram rapid-acting carbohydrate snack with you to treat low blood glucose. Family members and close friends should also know the symptoms and understand how to treat hypoglycemia, in case you are not able to treat yourself. Symptoms may include: Hunger. Anxiety. Sweating and feeling clammy. Dizziness or feeling light-headed. Sleepiness. Increased heart rate. Irritability. Tingling or numbness around the mouth, lips, or tongue. Restless sleep. Severe hypoglycemia is when your blood glucose level is at or below 54 mg/dL (3 mmol/L). Severe hypoglycemia is an emergency. Do not wait to see if the symptoms will go away. Get medical help right away. Call your local emergency services (911 in the [place] Do not drive yourself to the hospital. If you have severe hypoglycemia and you cannot eat or drink, you may need glucagon. A family member or close friend should learn how to check your blood glucose and how to give you glucagon. Ask your health care provider if you need to have an emergency glucagon kit available. Follow these instructions at home: Medicines Take  prescribed insulin or diabetes medicines as told by your health care provider. Do not run out of insulin or other diabetes medicines. Plan ahead so you always have these available. If you use insulin, adjust  your dosage based on your physical activity and what foods you eat. Your health care provider will tell you how to adjust your dosage. Take over-the-counter and prescription medicines only as told by your health care provider. Eating and drinking  What you eat and drink affects your blood glucose and your insulin dosage. Making good choices helps to control your diabetes and prevent other health problems. A healthy meal plan includes eating lean proteins, complex carbohydrates, fresh fruits and vegetables, low-fat dairy products, and healthy fats. Make an appointment to see a registered dietitian to help you create an eating plan that is right for you. Make sure that you: Follow instructions from your health care provider about eating or drinking restrictions. Drink enough fluid to keep your urine pale yellow. Keep a record of the carbohydrates that you eat. Do this by reading food labels and learning the standard serving sizes of foods. Follow your sick-day plan whenever you cannot eat or drink as usual. Make this plan in advance with your health care provider.  Activity Stay active. Exercise regularly, as told by your health care provider. This may include: Stretching and doing strength exercises, such as yoga or weight lifting, two or more times a week. Doing 150 minutes or more of moderate-intensity or vigorous-intensity exercise each week. This could be brisk walking, biking, or water aerobics. Spread out your activity over 3 or more days of the week. Do not go more than 2 days in a row without doing some kind of physical activity. When you start a new exercise or activity, work with your health care provider to adjust your insulin, medicines, or food intake as needed. Lifestyle Do not use any products that contain nicotine or tobacco. These products include cigarettes, chewing tobacco, and vaping devices, such as e-cigarettes. If you need help quitting, ask your health care provider. If you  drink alcohol and your health care provider says that it is safe for you: Limit how much you have to: 0-1 drink a day for women who are not pregnant. 0-2 drinks a day for men. Know how much alcohol is in your drink. In the [place] one drink equals one 12 oz bottle of beer (355 mL), one 5 oz glass of wine (148 mL), or one 1 oz glass of hard liquor (44 mL). Learn to manage stress. If you need help with this, ask your health care provider. Take care of your body  Keep your immunizations up to date. In addition to getting vaccinations as told by your health care provider, it is recommended that you get vaccinated against the following illnesses: The flu (influenza). Get a flu shot every year. Pneumonia. Hepatitis B. Schedule an eye exam soon after your diagnosis, and then one time every year after that. Check your skin and feet every day for cuts, bruises, redness, blisters, or sores. Schedule a foot exam with your health care provider once every year. Brush your teeth and gums two times a day, and floss one or more times a day. Visit your dentist one or more times every 6 months. Maintain a healthy weight. General instructions Share your diabetes management plan with people in your workplace, school, and household. Carry a medical alert card or wear medical alert jewelry. Keep all follow-up visits. This is important. Questions  to ask your health care provider Should I meet with a certified diabetes care and education specialist? Where can I find a support group for people with diabetes? Where to find more information For help and guidance and for more information about diabetes, please visit: American Diabetes Association (ADA): www.diabetes.org American Association of Diabetes Care and Education Specialists (ADCES): www.diabeteseducator.org International Diabetes Federation (IDF): MemberVerification.ca Summary Caring for yourself after you have been diagnosed with type 2 diabetes (type 2 diabetes  mellitus) means keeping your blood sugar (glucose) under control with a balance of nutrition, exercise, lifestyle changes, and medicine. Check your blood glucose every day, as often as told by your health care provider. Having diabetes can put you at risk for other long-term (chronic) conditions, such as heart disease and kidney disease. Your health care provider may prescribe medicines to help prevent complications from diabetes. Share your diabetes management plan with people in your workplace, school, and household. Keep all follow-up visits. This is important. This information is not intended to replace advice given to you by your health care provider. Make sure you discuss any questions you have with your health care provider. Document Revised: 03/14/2021 Document Reviewed: 03/14/2021 Elsevier Patient Education  [place] for Gastroesophageal Reflux Disease, Adult When you have gastroesophageal reflux disease (GERD), the foods you eat and your eating habits are very important. Choosing the right foods can help ease the discomfort of GERD. Consider working with a dietitian to help you make healthy food choices. What are tips for following this plan? Reading food labels Look for foods that are low in saturated fat. Foods that have less than 5% of daily value (DV) of fat and 0 g of trans fats may help with your symptoms. Cooking Cook foods using methods other than frying. This may include baking, steaming, grilling, or broiling. These are all methods that do not need a lot of fat for cooking. To add flavor, try to use herbs that are low in spice and acidity. Meal planning  Choose healthy foods that are low in fat, such as fruits, vegetables, whole grains, low-fat dairy products, lean meats, fish, and poultry. Eat frequent, small meals instead of three large meals each day. Eat your meals slowly, in a relaxed setting. Avoid bending over or lying down until 2-3 hours  after eating. Limit high-fat foods such as fatty meats or fried foods. Limit your intake of fatty foods, such as oils, butter, and shortening. Avoid the following as told by your health care provider: Foods that cause symptoms. These may be different for different people. Keep a food diary to keep track of foods that cause symptoms. Alcohol. Drinking large amounts of liquid with meals. Eating meals during the 2-3 hours before bed. Lifestyle Maintain a healthy weight. Ask your health care provider what weight is healthy for you. If you need to lose weight, work with your health care provider to do so safely. Exercise for at least 30 minutes on 5 or more days each week, or as told by your health care provider. Avoid wearing clothes that fit tightly around your waist and chest. Do not use any products that contain nicotine or tobacco. These products include cigarettes, chewing tobacco, and vaping devices, such as e-cigarettes. If you need help quitting, ask your health care provider. Sleep with the head of your bed raised. Use a wedge under the mattress or blocks under the bed frame to raise the head of the bed. Chew sugar-free gum after  mealtimes. What foods should I eat?  Eat a healthy, well-balanced diet of fruits, vegetables, whole grains, low-fat dairy products, lean meats, fish, and poultry. Each person is different. Foods that may trigger symptoms in one person may not trigger any symptoms in another person. Work with your health care provider to identify foods that are safe for you. The items listed above may not be a complete list of recommended foods and beverages. Contact a dietitian for more information. What foods should I avoid? Limiting some of these foods may help manage the symptoms of GERD. Everyone is different. Consult a dietitian or your health care provider to help you identify the exact foods to avoid, if any. Fruits Any fruits prepared with added fat. Any fruits that cause  symptoms. For some people this may include citrus fruits, such as oranges, grapefruit, pineapple, and lemons. Vegetables Deep-fried vegetables. [place] fries. Any vegetables prepared with added fat. Any vegetables that cause symptoms. For some people, this may include tomatoes and tomato products, chili peppers, onions and garlic, and horseradish. Grains Pastries or quick breads with added fat. Meats and other proteins High-fat meats, such as fatty beef or pork, hot dogs, ribs, ham, sausage, salami, and bacon. Fried meat or protein, including fried fish and fried chicken. Nuts and nut butters, in large amounts. Dairy Whole milk and chocolate milk. Sour cream. Cream. Ice cream. Cream cheese. Milkshakes. Fats and oils Butter. Margarine. Shortening. Ghee. Beverages Coffee and tea, with or without caffeine. Carbonated beverages. Sodas. Energy drinks. Fruit juice made with acidic fruits, such as orange or grapefruit. Tomato juice. Alcoholic drinks. Sweets and desserts Chocolate and cocoa. Donuts. Seasonings and condiments Pepper. Peppermint and spearmint. Added salt. Any condiments, herbs, or seasonings that cause symptoms. For some people, this may include curry, hot sauce, or vinegar-based salad dressings. The items listed above may not be a complete list of foods and beverages to avoid. Contact a dietitian for more information. Questions to ask your health care provider Diet and lifestyle changes are usually the first steps that are taken to manage symptoms of GERD. If diet and lifestyle changes do not improve your symptoms, talk with your health care provider about taking medicines. Where to find more information International Foundation for Gastrointestinal Disorders: aboutgerd.org Summary When you have gastroesophageal reflux disease (GERD), food and lifestyle choices may be very helpful in easing the discomfort of GERD. Eat frequent, small meals instead of three large meals each day. Eat  your meals slowly, in a relaxed setting. Avoid bending over or lying down until 2-3 hours after eating. Limit high-fat foods such as fatty meats or fried foods. This information is not intended to replace advice given to you by your health care provider. Make sure you discuss any questions you have with your health care provider. Document Revised: 04/24/2020 Document Reviewed: 04/24/2020 Elsevier Patient Education  [place].

## 2023-01-16 NOTE — Assessment & Plan Note (Signed)
Stable with flomax, continue same, but follow up with urology stressed.

## 2023-01-16 NOTE — Assessment & Plan Note (Signed)
Asymptomatic, follow up with cardiology as planned, tolerating plavix, Continue same.

## 2023-01-16 NOTE — Assessment & Plan Note (Signed)
Check updated PSA, follow up with urology due - number provided to call.

## 2023-01-16 NOTE — Assessment & Plan Note (Signed)
Continue PPI daily, trigger avoidance and if not improving at follow up consider GI eval.

## 2023-01-16 NOTE — Assessment & Plan Note (Signed)
Check updated labs. No new meds for now, recheck in 3 weeks to discuss labs, plan. Refer to optho for retinopathy screening, foot exam nl. Handout on self care and follow up intervals discussed.

## 2023-01-16 NOTE — Progress Notes (Signed)
Subjective:  Patient ID: Peter Taylor, male    DOB: Dec 24, 1950  Age: 72 y.o. MRN: [id number]  CC:  Chief Complaint  Patient presents with   Gastroesophageal Reflux   Coronary Artery Disease    Refills today    Health Maintenance    Please place a future order for pneumonia vaccine as patient would like to return tomorrow for nurse visit to have this done     HPI Peter Taylor presents for   GERD:  Taking protonix daily. Sometimes feels bloated. No n/v/abd pain.  Some belching with certain foods. No weight loss or night sweats.   Hyperlipidemia: With history of coronary artery disease, cardiology Dr. Irish Lack.  Drug-eluting stent to mid LAD 90% stenotic lesion in February 2021.  Continued on dual antiplatelet therapy.  Aspirin, Plavix.  Target LDL under 55 depending on blood sugar control.  Tolerating Lipitor 40 mg daily, no new side effects.off asa - only on plavix daily. Plans to call cardiology for appointment soon. No CP/dyspnea.   Lab Results  Component Value Date   CHOL 167 04/09/2021   HDL 85 04/09/2021   LDLCALC 69 04/09/2021   TRIG 68 04/09/2021   CHOLHDL 2.0 04/09/2021   Lab Results  Component Value Date   ALT 29 01/02/2022   AST 19 01/02/2022   ALKPHOS 128 (H) 01/02/2022   BILITOT 0.6 01/02/2022    Diabetes: A1c had increased to 7.1 last March.  He has not been seen since visit January 02 2022 with Maximiano Coss, NP.  Recommended dietary changes and exercise initially with 21-month follow-up.  No current meds. No change in diet/exercise. Rare soda, or sweet tea. Few per month, avoiding fast food. Truck driver. Minimal exercise.  Rare blurry vision - not persistent. No n/v/abd pain.  No home readings.  Microalbumin: Due. Optho, foot exam, pneumovax:  Ophthalmology: no recent visit. Needs referral.  Pneumovax, plans at next visit.  Foot exam, today Diabetic Foot Exam - Simple   Simple Foot Form Visual Inspection No deformities, no ulcerations, no other  skin breakdown bilaterally: Yes Sensation Testing Intact to touch and monofilament testing bilaterally: Yes Pulse Check Posterior Tibialis and Dorsalis pulse intact bilaterally: Yes Comments     Lab Results  Component Value Date   HGBA1C 7.1 (H) 01/02/2022   HGBA1C 6.8 (H) 01/17/2021   HGBA1C 6.6 (H) 11/26/2019   Lab Results  Component Value Date   LDLCALC 69 04/09/2021   CREATININE 1.19 01/02/2022    Thyroid nodules Note reviewed from surgery on February 29.  History of multiple thyroid nodules.Prior dysphagia that improved.  Ultrasound recently showing enlarging nodule in the mid left thyroid lobe which was mildly suspicious, plan for ultrasound-guided fine-needle aspiration.  Also plan for TSH.  Follow-up with surgery after FNA. FNA yesterday, cytopathology pending. TSH 2.14 on 12/31/2022, Care Everywhere  Elevated PSA 7.26 in June 2022, then 4.54 in August 2022.Evaluated by urology most recently February 2023.  Note reviewed.  BPH with right greater than left prostate.  Prostate MRI December 2022 -plan for continued surveillance of MRI lesion, with option of repeat MRI or biopsy in the future..  Treated with tamsulosin for BPH.  55-month follow-up recommended with PSA, PVR. Has not seen urology recently.  Flomax working well. No new side effects. No lightheadedness/dizziness.     History Patient Active Problem List   Diagnosis Date Noted   Gastroesophageal reflux disease without esophagitis 01/16/2023   Type 2 diabetes mellitus without complication, without long-term current  use of insulin ([place]) 01/16/2023   Thyroid nodule 01/16/2023   Elevated PSA 01/16/2023   Benign prostatic hyperplasia 01/16/2023   Coronary artery disease    Hyperlipidemia 11/11/2019    Past Medical History:  Diagnosis Date   CAD (coronary artery disease), native coronary artery    a. Cath 12/10/19: 90% mLAD s/p DES, mild non obstructive disease in RCA and Cx   GERD (gastroesophageal reflux  disease)    Hyperlipidemia    Substance abuse ([place])    Tuberculosis       Review of Systems  Constitutional:  Negative for fatigue and unexpected weight change.  Eyes:  Negative for visual disturbance.  Respiratory:  Negative for cough, chest tightness and shortness of breath.   Cardiovascular:  Negative for chest pain, palpitations and leg swelling.  Gastrointestinal:  Negative for abdominal pain and blood in stool.  Neurological:  Negative for dizziness, light-headedness and headaches.   Other per HPI.   Objective:   Vitals:   01/16/23 1004  BP: 122/70  Pulse: 66  Temp: 98.2 F (36.8 C)  TempSrc: Temporal  SpO2: 99%  Weight: 195 lb 6.4 oz (88.6 kg)  Height: 5\' 11"  (1.803 m)     Physical Exam Vitals reviewed.  Constitutional:      Appearance: He is well-developed.  HENT:     Head: Normocephalic and atraumatic.  Neck:     Vascular: No carotid bruit or JVD.  Cardiovascular:     Rate and Rhythm: Normal rate and regular rhythm.     Heart sounds: Normal heart sounds. No murmur heard. Pulmonary:     Effort: Pulmonary effort is normal.     Breath sounds: Normal breath sounds. No rales.  Musculoskeletal:     Right lower leg: No edema.     Left lower leg: No edema.  Skin:    General: Skin is warm and dry.  Neurological:     Mental Status: He is alert and oriented to person, place, and time.  Psychiatric:        Mood and Affect: Mood normal.   65 minutes spent during visit, including chart review, multiple specialist note review, counseling and assimilation of information, exam, discussion of plan, and chart completion.   Assessment & Plan:  Peter Taylor is a 72 y.o. male . Type 2 diabetes mellitus without complication, without long-term current use of insulin ([place]) Assessment & Plan: Check updated labs. No new meds for now, recheck in 3 weeks to discuss labs, plan. Refer to optho for retinopathy screening, foot exam nl. Handout on self care and follow up  intervals discussed.   Orders: -     Ambulatory referral to Ophthalmology -     Comprehensive metabolic panel -     Microalbumin / creatinine urine ratio -     Hemoglobin A1c  Coronary artery disease involving coronary bypass graft of native heart without angina pectoris Assessment & Plan: Asymptomatic, follow up with cardiology as planned, tolerating plavix, Continue same.   Orders: -     Clopidogrel Bisulfate; TAKE 1 TABLET(75 MG) BY MOUTH DAILY WITH BREAKFAST  Dispense: 90 tablet; Refill: 1 -     Comprehensive metabolic panel -     Lipid panel -     Atorvastatin Calcium; TAKE 1 TABLET(40 MG) BY MOUTH DAILY  Dispense: 90 tablet; Refill: 2  Gastroesophageal reflux disease without esophagitis Assessment & Plan: Continue PPI daily, trigger avoidance and if not improving at follow up consider GI eval.   Orders: -  Pantoprazole Sodium; Take 1 tablet (40 mg total) by mouth daily.  Dispense: 90 tablet; Refill: 2  Heartburn  Thyroid nodule Assessment & Plan: TSH normal, recent FNA - follow up with surgeon as planned.    Elevated PSA Assessment & Plan: Check updated PSA, follow up with urology due - number provided to call.   Orders: -     PSA  Benign prostatic hyperplasia, unspecified whether lower urinary tract symptoms present Assessment & Plan: Stable with flomax, continue same, but follow up with urology stressed.   Orders: -     Tamsulosin HCl; TAKE 1 CAPSULE(0.4 MG) BY MOUTH DAILY  Dispense: 90 capsule; Refill: 1 -     PSA    Patient Instructions   I will check some labs today to see if any med changes needed. Follow up 3 weeks to review labs/plan.  I will refer you to eye doctor.  I will check PSA today, but call urologist to schedule follow up as 6 months were recommended last February.   Dr.Matthew Junious Silk, Urologist Address: [address], [place],  [address] Phone: [phone]  Continue protonix daily. Avoid trigger foods below for  heartburn and if continued belching or heartburn. If not improving by next visit I can refer you to gastroenterologist.   Return to the clinic or go to the nearest emergency room if any of your symptoms worsen or new symptoms occur.  Thanks for coming in today!  Type 2 Diabetes Mellitus, Self-Care, Adult Caring for yourself after you have been diagnosed with type 2 diabetes (type 2 diabetes mellitus) means keeping your blood sugar (glucose) under control with a balance of: Nutrition. Exercise. Lifestyle changes. Medicines or insulin, if needed. Support from your team of health care providers and others. What are the risks? Having type 2 diabetes can put you at risk for other long-term (chronic) conditions, such as heart disease and kidney disease. Your health care provider may prescribe medicines to help prevent complications from diabetes. How to monitor your blood glucose  Check your blood glucose every day or as often as told by your health care provider. Have your A1C (hemoglobin A1C) level checked two or more times a year, or as often as told by your health care provider. Your health care provider will set personalized treatment goals for you. Generally, the goal of treatment is to maintain the following blood glucose levels: Before meals: 80-130 mg/dL (4.4-7.2 mmol/L). After meals: below 180 mg/dL (10 mmol/L). A1C level: less than 7%. How to manage hyperglycemia and hypoglycemia Hyperglycemia symptoms Hyperglycemia, also called high blood glucose, occurs when blood glucose is too high. Make sure you know the early signs of hyperglycemia, such as: Increased thirst. Hunger. Feeling very tired. Needing to urinate more often than usual. Blurry vision. Hypoglycemia symptoms Hypoglycemia, also called low blood glucose, occurs with a blood glucose level at or below 70 mg/dL (3.9 mmol/L). Diabetes medicines lower your blood glucose and can cause hypoglycemia. The risk for hypoglycemia  increases during or after exercise, during sleep, during illness, and when skipping meals or not eating for a long time (fasting). It is important to know the symptoms of hypoglycemia and treat it right away. Always have a 15-gram rapid-acting carbohydrate snack with you to treat low blood glucose. Family members and close friends should also know the symptoms and understand how to treat hypoglycemia, in case you are not able to treat yourself. Symptoms may include: Hunger. Anxiety. Sweating and feeling clammy. Dizziness or  feeling light-headed. Sleepiness. Increased heart rate. Irritability. Tingling or numbness around the mouth, lips, or tongue. Restless sleep. Severe hypoglycemia is when your blood glucose level is at or below 54 mg/dL (3 mmol/L). Severe hypoglycemia is an emergency. Do not wait to see if the symptoms will go away. Get medical help right away. Call your local emergency services (911 in the [place] Do not drive yourself to the hospital. If you have severe hypoglycemia and you cannot eat or drink, you may need glucagon. A family member or close friend should learn how to check your blood glucose and how to give you glucagon. Ask your health care provider if you need to have an emergency glucagon kit available. Follow these instructions at home: Medicines Take prescribed insulin or diabetes medicines as told by your health care provider. Do not run out of insulin or other diabetes medicines. Plan ahead so you always have these available. If you use insulin, adjust your dosage based on your physical activity and what foods you eat. Your health care provider will tell you how to adjust your dosage. Take over-the-counter and prescription medicines only as told by your health care provider. Eating and drinking  What you eat and drink affects your blood glucose and your insulin dosage. Making good choices helps to control your diabetes and prevent other health problems. A healthy  meal plan includes eating lean proteins, complex carbohydrates, fresh fruits and vegetables, low-fat dairy products, and healthy fats. Make an appointment to see a registered dietitian to help you create an eating plan that is right for you. Make sure that you: Follow instructions from your health care provider about eating or drinking restrictions. Drink enough fluid to keep your urine pale yellow. Keep a record of the carbohydrates that you eat. Do this by reading food labels and learning the standard serving sizes of foods. Follow your sick-day plan whenever you cannot eat or drink as usual. Make this plan in advance with your health care provider.  Activity Stay active. Exercise regularly, as told by your health care provider. This may include: Stretching and doing strength exercises, such as yoga or weight lifting, two or more times a week. Doing 150 minutes or more of moderate-intensity or vigorous-intensity exercise each week. This could be brisk walking, biking, or water aerobics. Spread out your activity over 3 or more days of the week. Do not go more than 2 days in a row without doing some kind of physical activity. When you start a new exercise or activity, work with your health care provider to adjust your insulin, medicines, or food intake as needed. Lifestyle Do not use any products that contain nicotine or tobacco. These products include cigarettes, chewing tobacco, and vaping devices, such as e-cigarettes. If you need help quitting, ask your health care provider. If you drink alcohol and your health care provider says that it is safe for you: Limit how much you have to: 0-1 drink a day for women who are not pregnant. 0-2 drinks a day for men. Know how much alcohol is in your drink. In the [place] one drink equals one 12 oz bottle of beer (355 mL), one 5 oz glass of wine (148 mL), or one 1 oz glass of hard liquor (44 mL). Learn to manage stress. If you need help with this, ask your  health care provider. Take care of your body  Keep your immunizations up to date. In addition to getting vaccinations as told by your health care provider, it  is recommended that you get vaccinated against the following illnesses: The flu (influenza). Get a flu shot every year. Pneumonia. Hepatitis B. Schedule an eye exam soon after your diagnosis, and then one time every year after that. Check your skin and feet every day for cuts, bruises, redness, blisters, or sores. Schedule a foot exam with your health care provider once every year. Brush your teeth and gums two times a day, and floss one or more times a day. Visit your dentist one or more times every 6 months. Maintain a healthy weight. General instructions Share your diabetes management plan with people in your workplace, school, and household. Carry a medical alert card or wear medical alert jewelry. Keep all follow-up visits. This is important. Questions to ask your health care provider Should I meet with a certified diabetes care and education specialist? Where can I find a support group for people with diabetes? Where to find more information For help and guidance and for more information about diabetes, please visit: American Diabetes Association (ADA): www.diabetes.org American Association of Diabetes Care and Education Specialists (ADCES): www.diabeteseducator.org International Diabetes Federation (IDF): MemberVerification.ca Summary Caring for yourself after you have been diagnosed with type 2 diabetes (type 2 diabetes mellitus) means keeping your blood sugar (glucose) under control with a balance of nutrition, exercise, lifestyle changes, and medicine. Check your blood glucose every day, as often as told by your health care provider. Having diabetes can put you at risk for other long-term (chronic) conditions, such as heart disease and kidney disease. Your health care provider may prescribe medicines to help prevent complications from  diabetes. Share your diabetes management plan with people in your workplace, school, and household. Keep all follow-up visits. This is important. This information is not intended to replace advice given to you by your health care provider. Make sure you discuss any questions you have with your health care provider. Document Revised: 03/14/2021 Document Reviewed: 03/14/2021 Elsevier Patient Education  [place] for Gastroesophageal Reflux Disease, Adult When you have gastroesophageal reflux disease (GERD), the foods you eat and your eating habits are very important. Choosing the right foods can help ease the discomfort of GERD. Consider working with a dietitian to help you make healthy food choices. What are tips for following this plan? Reading food labels Look for foods that are low in saturated fat. Foods that have less than 5% of daily value (DV) of fat and 0 g of trans fats may help with your symptoms. Cooking Cook foods using methods other than frying. This may include baking, steaming, grilling, or broiling. These are all methods that do not need a lot of fat for cooking. To add flavor, try to use herbs that are low in spice and acidity. Meal planning  Choose healthy foods that are low in fat, such as fruits, vegetables, whole grains, low-fat dairy products, lean meats, fish, and poultry. Eat frequent, small meals instead of three large meals each day. Eat your meals slowly, in a relaxed setting. Avoid bending over or lying down until 2-3 hours after eating. Limit high-fat foods such as fatty meats or fried foods. Limit your intake of fatty foods, such as oils, butter, and shortening. Avoid the following as told by your health care provider: Foods that cause symptoms. These may be different for different people. Keep a food diary to keep track of foods that cause symptoms. Alcohol. Drinking large amounts of liquid with meals. Eating meals during the 2-3 hours  before  bed. Lifestyle Maintain a healthy weight. Ask your health care provider what weight is healthy for you. If you need to lose weight, work with your health care provider to do so safely. Exercise for at least 30 minutes on 5 or more days each week, or as told by your health care provider. Avoid wearing clothes that fit tightly around your waist and chest. Do not use any products that contain nicotine or tobacco. These products include cigarettes, chewing tobacco, and vaping devices, such as e-cigarettes. If you need help quitting, ask your health care provider. Sleep with the head of your bed raised. Use a wedge under the mattress or blocks under the bed frame to raise the head of the bed. Chew sugar-free gum after mealtimes. What foods should I eat?  Eat a healthy, well-balanced diet of fruits, vegetables, whole grains, low-fat dairy products, lean meats, fish, and poultry. Each person is different. Foods that may trigger symptoms in one person may not trigger any symptoms in another person. Work with your health care provider to identify foods that are safe for you. The items listed above may not be a complete list of recommended foods and beverages. Contact a dietitian for more information. What foods should I avoid? Limiting some of these foods may help manage the symptoms of GERD. Everyone is different. Consult a dietitian or your health care provider to help you identify the exact foods to avoid, if any. Fruits Any fruits prepared with added fat. Any fruits that cause symptoms. For some people this may include citrus fruits, such as oranges, grapefruit, pineapple, and lemons. Vegetables Deep-fried vegetables. [place] fries. Any vegetables prepared with added fat. Any vegetables that cause symptoms. For some people, this may include tomatoes and tomato products, chili peppers, onions and garlic, and horseradish. Grains Pastries or quick breads with added fat. Meats and other  proteins High-fat meats, such as fatty beef or pork, hot dogs, ribs, ham, sausage, salami, and bacon. Fried meat or protein, including fried fish and fried chicken. Nuts and nut butters, in large amounts. Dairy Whole milk and chocolate milk. Sour cream. Cream. Ice cream. Cream cheese. Milkshakes. Fats and oils Butter. Margarine. Shortening. Ghee. Beverages Coffee and tea, with or without caffeine. Carbonated beverages. Sodas. Energy drinks. Fruit juice made with acidic fruits, such as orange or grapefruit. Tomato juice. Alcoholic drinks. Sweets and desserts Chocolate and cocoa. Donuts. Seasonings and condiments Pepper. Peppermint and spearmint. Added salt. Any condiments, herbs, or seasonings that cause symptoms. For some people, this may include curry, hot sauce, or vinegar-based salad dressings. The items listed above may not be a complete list of foods and beverages to avoid. Contact a dietitian for more information. Questions to ask your health care provider Diet and lifestyle changes are usually the first steps that are taken to manage symptoms of GERD. If diet and lifestyle changes do not improve your symptoms, talk with your health care provider about taking medicines. Where to find more information International Foundation for Gastrointestinal Disorders: aboutgerd.org Summary When you have gastroesophageal reflux disease (GERD), food and lifestyle choices may be very helpful in easing the discomfort of GERD. Eat frequent, small meals instead of three large meals each day. Eat your meals slowly, in a relaxed setting. Avoid bending over or lying down until 2-3 hours after eating. Limit high-fat foods such as fatty meats or fried foods. This information is not intended to replace advice given to you by your health care provider. Make sure you discuss any questions you  have with your health care provider. Document Revised: 04/24/2020 Document Reviewed: 04/24/2020 Elsevier Patient Education   [place],   Merri Ray, MD [place], [facility] 01/16/23 12:16 PM

## 2023-01-16 NOTE — Assessment & Plan Note (Signed)
TSH normal, recent FNA - follow up with surgeon as planned.

## 2023-01-17 LAB — CYTOLOGY - NON PAP

## 2023-01-23 NOTE — Progress Notes (Signed)
USN guided FNA biopsy benign.  Result called to patient.  No complaints of dysphagia at this time.  Will plan follow up in one year with USN and TSH level followed by office visit for exam.  Patient in agreement.  Armandina Gemma, MD [facility] Office: [phone]

## 2023-02-05 DIAGNOSIS — H25813 Combined forms of age-related cataract, bilateral: Secondary | ICD-10-CM | POA: Diagnosis not present

## 2023-02-05 DIAGNOSIS — E119 Type 2 diabetes mellitus without complications: Secondary | ICD-10-CM | POA: Diagnosis not present

## 2023-02-05 DIAGNOSIS — H35411 Lattice degeneration of retina, right eye: Secondary | ICD-10-CM | POA: Diagnosis not present

## 2023-02-05 LAB — HM DIABETES EYE EXAM

## 2023-02-06 ENCOUNTER — Ambulatory Visit (INDEPENDENT_AMBULATORY_CARE_PROVIDER_SITE_OTHER): Payer: Medicare PPO | Admitting: Family Medicine

## 2023-02-06 ENCOUNTER — Encounter: Payer: Self-pay | Admitting: Family Medicine

## 2023-02-06 VITALS — BP 122/60 | HR 55 | Temp 98.6°F | Ht 71.0 in | Wt 194.6 lb

## 2023-02-06 DIAGNOSIS — N5201 Erectile dysfunction due to arterial insufficiency: Secondary | ICD-10-CM | POA: Diagnosis not present

## 2023-02-06 DIAGNOSIS — R140 Abdominal distension (gaseous): Secondary | ICD-10-CM | POA: Diagnosis not present

## 2023-02-06 DIAGNOSIS — R3915 Urgency of urination: Secondary | ICD-10-CM | POA: Diagnosis not present

## 2023-02-06 DIAGNOSIS — R3912 Poor urinary stream: Secondary | ICD-10-CM | POA: Diagnosis not present

## 2023-02-06 DIAGNOSIS — K219 Gastro-esophageal reflux disease without esophagitis: Secondary | ICD-10-CM

## 2023-02-06 DIAGNOSIS — R972 Elevated prostate specific antigen [PSA]: Secondary | ICD-10-CM

## 2023-02-06 DIAGNOSIS — N401 Enlarged prostate with lower urinary tract symptoms: Secondary | ICD-10-CM | POA: Diagnosis not present

## 2023-02-06 NOTE — Patient Instructions (Signed)
Glad you are feeling better. Continue same meds, and follow up with specialists as planned. Keep up the good work with diet!

## 2023-02-06 NOTE — Progress Notes (Signed)
Subjective:  Patient ID: Peter Taylor, male    DOB: Sep 07, 1951  Age: 72 y.o. MRN: [id number]  CC:  Chief Complaint  Patient presents with   Gastroesophageal Reflux    Pt states he gets really bloated after eating. 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 Her for lab review as well     HPI [name] presents for   GERD, bloating History of GERD treated with Protonix. Discussed last visit.  As above has adjusted how he eats and improves when not eating while driving.more salads, avoiding trigger foods. Bloating better, heartburn better, lost some weight.  Still on daily PPI.  Denies unexplained weight loss, night sweats, fevers or current abdominal pain.  No melena/hematochezia.  BPH with elevated PSA Followed by urology, treated with tamsulosin for BPH.  Plan for follow-up PSA, PVR at discussion with urology in 2023, follow-up recommended at his last visit.  PSA was elevated at 6.05, 4.54 in August 2022, 7.26 in June 2022. Appt with urology today.     History Patient Active Problem List   Diagnosis Date Noted   Gastroesophageal reflux disease without esophagitis 01/16/2023   Type 2 diabetes mellitus without complication, without long-term current use of insulin 01/16/2023   Thyroid nodule 01/16/2023   Elevated PSA 01/16/2023   Benign prostatic hyperplasia 01/16/2023   Coronary artery disease    Hyperlipidemia 11/11/2019   Past Medical History:  Diagnosis Date   CAD (coronary artery disease), native coronary artery    a. Cath 12/10/19: 90% mLAD s/p DES, mild non obstructive disease in RCA and Cx   GERD (gastroesophageal reflux disease)    Hyperlipidemia    Substance abuse    Tuberculosis    Past Surgical History:  Procedure Laterality Date   CORONARY STENT INTERVENTION N/A 12/10/2019   Procedure: CORONARY STENT INTERVENTION;  Surgeon: Corky Crafts, MD;  Location: [facility] INVASIVE CV LAB;  Service: Cardiovascular;   Laterality: N/A;   LEFT HEART CATH AND CORONARY ANGIOGRAPHY N/A 12/10/2019   Procedure: LEFT HEART CATH AND CORONARY ANGIOGRAPHY;  Surgeon: Corky Crafts, MD;  Location: [facility] INVASIVE CV LAB;  Service: Cardiovascular;  Laterality: N/A;   NO PAST SURGERIES     Allergies  Allergen Reactions   Penicillins Hives    Did it involve swelling of the face/tongue/throat, SOB, or low BP? No Did it involve sudden or severe rash/hives, skin peeling, or any reaction on the inside of your mouth or nose? No Did you need to seek medical attention at a hospital or doctor's office? No When did it last happen?      35+ years If all above answers are "NO", may proceed with cephalosporin use.    Prior to Admission medications   Medication Sig Start Date End Date Taking? Authorizing Provider  atorvastatin (LIPITOR) 40 MG tablet TAKE 1 TABLET(40 MG) BY MOUTH DAILY 01/16/23  Yes Shade Flood, MD  clopidogrel (PLAVIX) 75 MG tablet TAKE 1 TABLET(75 MG) BY MOUTH DAILY WITH BREAKFAST 01/16/23  Yes Shade Flood, MD  pantoprazole (PROTONIX) 40 MG tablet Take 1 tablet (40 mg total) by mouth daily. 01/16/23  Yes Shade Flood, MD  tamsulosin (FLOMAX) 0.4 MG CAPS capsule TAKE 1 CAPSULE(0.4 MG) BY MOUTH DAILY 01/16/23  Yes Shade Flood, MD   Social History   Socioeconomic History   Marital status: Single    Spouse name: Not on file   Number of children: 8  Years of education: Not on file   Highest education level: Not on file  Occupational History   Occupation: truck driver  Tobacco Use   Smoking status: Former   Smokeless tobacco: Former  Building services engineerVaping Use   Vaping Use: Never used  Substance and Sexual Activity   Alcohol use: Yes    Comment: occ   Drug use: No   Sexual activity: Not on file  Other Topics Concern   Not on file  Social History Narrative   Not on file   Social Determinants of Health   Financial Resource Strain: Low Risk  (04/18/2022)   Overall Financial Resource Strain  (CARDIA)    Difficulty of Paying Living Expenses: Not hard at all  Food Insecurity: No Food Insecurity (06/14/2022)   Hunger Vital Sign    Worried About Running Out of Food in the Last Year: Never true    Ran Out of Food in the Last Year: Never true  Transportation Needs: No Transportation Needs (06/14/2022)   PRAPARE - Administrator, Civil ServiceTransportation    Lack of Transportation (Medical): No    Lack of Transportation (Non-Medical): No  Physical Activity: Insufficiently Active (04/18/2022)   Exercise Vital Sign    Days of Exercise per Week: 3 days    Minutes of Exercise per Session: 30 min  Stress: No Stress Concern Present (01/11/2020)   [employer] - Occupational Stress Questionnaire    Feeling of Stress : Not at all  Social Connections: Moderately Integrated (04/18/2022)   Social Connection and Isolation Panel [NHANES]    Frequency of Communication with Friends and Family: Three times a week    Frequency of Social Gatherings with Friends and Family: Three times a week    Attends Religious Services: More than 4 times per year    Active Member of Clubs or Organizations: Yes    Attends BankerClub or Organization Meetings: 1 to 4 times per year    Marital Status: Never married  Intimate Partner Violence: Not At Risk (06/14/2022)   Humiliation, Afraid, Rape, and Kick questionnaire    Fear of Current or Ex-Partner: No    Emotionally Abused: No    Physically Abused: No    Sexually Abused: No    Review of Systems   Objective:   Vitals:   02/06/23 0935  BP: 122/60  Pulse: (!) 55  Temp: 98.6 F (37 C)  TempSrc: Temporal  SpO2: 99%  Weight: 194 lb 9.6 oz (88.3 kg)  Height: 5\' 11"  (1.803 m)     Physical Exam Vitals reviewed.  Constitutional:      Appearance: He is well-developed.  HENT:     Head: Normocephalic and atraumatic.  Neck:     Vascular: No carotid bruit or JVD.  Cardiovascular:     Rate and Rhythm: Normal rate and regular rhythm.     Heart sounds: Normal  heart sounds. No murmur heard. Pulmonary:     Effort: Pulmonary effort is normal.     Breath sounds: Normal breath sounds. No rales.  Abdominal:     General: Abdomen is flat. There is no distension.     Tenderness: There is no abdominal tenderness.  Musculoskeletal:     Right lower leg: No edema.     Left lower leg: No edema.  Skin:    General: Skin is warm and dry.  Neurological:     Mental Status: He is alert and oriented to person, place, and time.  Psychiatric:  Mood and Affect: Mood normal.     Assessment & Plan:  LEEMAN KERWICK is a 72 y.o. male . Gastroesophageal reflux disease without esophagitis Abdominal bloating  -Improved with dietary adjustments, continue proton pump inhibitor, dietary adherence with RTC precautions.  Reassuring exam at this time.  No red flags on history, exam.  Keep follow-up in the next few months as planned.  Elevated PSA  -Urology appointment today.  Will watch for any recommended changes.  No orders of the defined types were placed in this encounter.  Patient Instructions  Glad you are feeling better. Continue same meds, and follow up with specialists as planned. Keep up the good work with diet!      Signed,   Meredith Staggers, MD [facility] 02/06/23 10:34 AM

## 2023-02-17 ENCOUNTER — Ambulatory Visit: Payer: Medicare PPO | Attending: Nurse Practitioner | Admitting: Nurse Practitioner

## 2023-02-17 ENCOUNTER — Encounter: Payer: Self-pay | Admitting: Nurse Practitioner

## 2023-02-17 VITALS — BP 120/78 | HR 56 | Ht 71.0 in | Wt 190.0 lb

## 2023-02-17 DIAGNOSIS — I2510 Atherosclerotic heart disease of native coronary artery without angina pectoris: Secondary | ICD-10-CM

## 2023-02-17 DIAGNOSIS — E785 Hyperlipidemia, unspecified: Secondary | ICD-10-CM | POA: Diagnosis not present

## 2023-02-17 DIAGNOSIS — N400 Enlarged prostate without lower urinary tract symptoms: Secondary | ICD-10-CM | POA: Diagnosis not present

## 2023-02-17 DIAGNOSIS — E119 Type 2 diabetes mellitus without complications: Secondary | ICD-10-CM | POA: Diagnosis not present

## 2023-02-17 DIAGNOSIS — K219 Gastro-esophageal reflux disease without esophagitis: Secondary | ICD-10-CM | POA: Diagnosis not present

## 2023-02-17 NOTE — Progress Notes (Unsigned)
Office Visit    Patient Name: Peter Taylor Date of Encounter: 02/17/2023  Primary Care Provider:  Shade Flood, MD Primary Cardiologist:  Lance Muss, MD  Chief Complaint    72 year old male with a history of CAD s/p DES-LAD in 2021, hyperlipidemia, type 2 diabetes, BPH, and GERD who presents for follow-up related to CAD.  Past Medical History    Past Medical History:  Diagnosis Date   CAD (coronary artery disease), native coronary artery    a. Cath 12/10/19: 90% mLAD s/p DES, mild non obstructive disease in RCA and Cx   GERD (gastroesophageal reflux disease)    Hyperlipidemia    Substance abuse    Tuberculosis    Past Surgical History:  Procedure Laterality Date   CORONARY STENT INTERVENTION N/A 12/10/2019   Procedure: CORONARY STENT INTERVENTION;  Surgeon: Corky Crafts, MD;  Location: [facility] INVASIVE CV LAB;  Service: Cardiovascular;  Laterality: N/A;   LEFT HEART CATH AND CORONARY ANGIOGRAPHY N/A 12/10/2019   Procedure: LEFT HEART CATH AND CORONARY ANGIOGRAPHY;  Surgeon: Corky Crafts, MD;  Location: [facility] INVASIVE CV LAB;  Service: Cardiovascular;  Laterality: N/A;   NO PAST SURGERIES      Allergies  Allergies  Allergen Reactions   Penicillins Hives    Did it involve swelling of the face/tongue/throat, SOB, or low BP? No Did it involve sudden or severe rash/hives, skin peeling, or any reaction on the inside of your mouth or nose? No Did you need to seek medical attention at a hospital or doctor's office? No When did it last happen?      35+ years If all above answers are "NO", may proceed with cephalosporin use.      Labs/Other Studies Reviewed    The following studies were reviewed today: LHC January 02, 2020: Mid LAD lesion is 90% stenosed. A drug-eluting stent was successfully placed using a STENT RESOLUTE ONYX 3.0X22. Post intervention, there is a 0% residual stenosis. The left ventricular systolic function is normal. LV end diastolic pressure  is normal. The left ventricular ejection fraction is 55-65% by visual estimate. There is no aortic valve stenosis. Mild, nonobstructive disease in the RCA and circumflex.   Continue dual antiplatelet therapy along with aggressive secondary prevention.    Plan for same day discharge.    Before leaving the room, the patient became nauseated and vomited his Plavix.  A bolus only of tirofiban was given and then he will be given a dose of crushed Plavix 300 mg PO x 1 in the holding area.  Will continue to plan for same day PCI.   Recent Labs: 01/16/2023: ALT 26; BUN 18; Creatinine, Ser 1.27; Potassium 3.9; Sodium 142  Recent Lipid Panel    Component Value Date/Time   CHOL 143 01/16/2023 1104   CHOL 167 04/09/2021 1019   TRIG 108.0 01/16/2023 1104   HDL 58.50 01/16/2023 1104   HDL 85 04/09/2021 1019   CHOLHDL 2 01/16/2023 1104   VLDL 21.6 01/16/2023 1104   LDLCALC 63 01/16/2023 1104   LDLCALC 69 04/09/2021 1019    History of Present Illness    72 year old male with the above past medical history including CAD s/p DES-LAD in 2021, hyperlipidemia, type 2 diabetes, BPH, and GERD.  Coronary CT angiogram in 01/02/2020 in the setting of progressive chest pain showed multivessel CAD, positive FFR in the proximal to mid LAD.  Cardiac catheterization revealed mid LAD 90% stenosis s/p DES, mild nonobstructive disease in the RCA and left circumflex, EF  55 to 60% by visual estimate.  He was last seen in the office on 03/05/2022 and was stable from a cardiac standpoint.  He denied symptoms concerning for angina.  He presents today for follow-up.  Since his last visit he has been stable from a cardiac standpoint.  He denies symptoms concerning for angina.  Overall, he reports feeling well.  Home Medications    Current Outpatient Medications  Medication Sig Dispense Refill   atorvastatin (LIPITOR) 40 MG tablet TAKE 1 TABLET(40 MG) BY MOUTH DAILY 90 tablet 2   clopidogrel (PLAVIX) 75 MG tablet TAKE 1  TABLET(75 MG) BY MOUTH DAILY WITH BREAKFAST 90 tablet 1   pantoprazole (PROTONIX) 40 MG tablet Take 1 tablet (40 mg total) by mouth daily. 90 tablet 2   tamsulosin (FLOMAX) 0.4 MG CAPS capsule TAKE 1 CAPSULE(0.4 MG) BY MOUTH DAILY 90 capsule 1   No current facility-administered medications for this visit.     Review of Systems    He denies chest pain, palpitations, dyspnea, pnd, orthopnea, n, v, dizziness, syncope, edema, weight gain, or early satiety. All other systems reviewed and are otherwise negative except as noted above.   Physical Exam    VS:  BP 120/78   Pulse (!) 56   Ht  (1.803 m)   Wt 190 lb (86.2 kg)   SpO2 97%   BMI 26.50 kg/m   GEN: Well nourished, well developed, in no acute distress. HEENT: normal. Neck: Supple, no JVD, carotid bruits, or masses. Cardiac: RRR, no murmurs, rubs, or gallops. No clubbing, cyanosis, edema.  Radials/DP/PT 2+ and equal bilaterally.  Respiratory:  Respirations regular and unlabored, clear to auscultation bilaterally. GI: Soft, nontender, nondistended, BS + x 4. MS: no deformity or atrophy. Skin: warm and dry, no rash. Neuro:  Strength and sensation are intact. Psych: Normal affect.  Accessory Clinical Findings    ECG personally reviewed by me today -sinus bradycardia, 56 bpm- no acute changes.   Lab Results  Component Value Date   WBC 5.0 01/02/2022   HGB 14.0 01/02/2022   HCT 42.3 01/02/2022   MCV 87.9 01/02/2022   PLT 230.0 01/02/2022   Lab Results  Component Value Date   CREATININE 1.27 01/16/2023   BUN 18 01/16/2023   NA 142 01/16/2023   K 3.9 01/16/2023   CL 106 01/16/2023   CO2 28 01/16/2023   Lab Results  Component Value Date   ALT 26 01/16/2023   AST 15 01/16/2023   ALKPHOS 129 (H) 01/16/2023   BILITOT 0.6 01/16/2023   Lab Results  Component Value Date   CHOL 143 01/16/2023   HDL 58.50 01/16/2023   LDLCALC 63 01/16/2023   TRIG 108.0 01/16/2023   CHOLHDL 2 01/16/2023    Lab Results  Component  Value Date   HGBA1C 7.0 (H) 01/16/2023    Assessment & Plan    1. CAD: S/p DES-LAD in 2021. Stable with no anginal symptoms. No indication for ischemic evaluation.  DOT form completed in office today.  Continue Plavix, Lipitor.  2. Hyperlipidemia: LDL was 63 in 12/2022.  Continue Lipitor.  3. Type 2 diabetes: A1c was 7.0 in 12/2022.  Monitored and managed per PCP.  4. BPH: Follows with urology.  5. GERD:  Stable on Protonix.  Managed per PCP.  6. Disposition: Follow-up in 1 year.      Joylene Grapes, NP 02/17/2023, 2:30 PM

## 2023-02-17 NOTE — Patient Instructions (Signed)
Medication Instructions:  Your physician recommends that you continue on your current medications as directed. Please refer to the Current Medication list given to you today.  *If you need a refill on your cardiac medications before your next appointment, please call your pharmacy*   Lab Work: NONE ordered at this time of appointment   If you have labs (blood work) drawn today and your tests are completely normal, you will receive your results only by: MyChart Message (if you have MyChart) OR A paper copy in the mail If you have any lab test that is abnormal or we need to change your treatment, we will call you to review the results.   Testing/Procedures: NONE ordered at this time of appointment     Follow-Up: At [facility], you and your health needs are our priority.  As part of our continuing mission to provide you with exceptional heart care, we have created designated Provider Care Teams.  These Care Teams include your primary Cardiologist (physician) and Advanced Practice Providers (APPs -  Physician Assistants and Nurse Practitioners) who all work together to provide you with the care you need, when you need it.  We recommend signing up for the patient portal called "MyChart".  Sign up information is provided on this After Visit Summary.  MyChart is used to connect with patients for Virtual Visits (Telemedicine).  Patients are able to view lab/test results, encounter notes, upcoming appointments, etc.  Non-urgent messages can be sent to your provider as well.   To learn more about what you can do with MyChart, go to [email or web address].    Your next appointment:   1 year(s)  Provider:   Lance Muss, MD     Other Instructions

## 2023-02-19 ENCOUNTER — Encounter: Payer: Self-pay | Admitting: Nurse Practitioner

## 2023-03-16 ENCOUNTER — Emergency Department (HOSPITAL_COMMUNITY)
Admission: EM | Admit: 2023-03-16 | Discharge: 2023-03-17 | Discharge status: Against Medical Advice | Payer: Medicare PPO | Attending: Emergency Medicine | Admitting: Emergency Medicine

## 2023-03-16 ENCOUNTER — Other Ambulatory Visit: Payer: Self-pay

## 2023-03-16 ENCOUNTER — Encounter (HOSPITAL_COMMUNITY): Payer: Self-pay | Admitting: Emergency Medicine

## 2023-03-16 DIAGNOSIS — Z5321 Procedure and treatment not carried out due to patient leaving prior to being seen by health care provider: Secondary | ICD-10-CM | POA: Diagnosis not present

## 2023-03-16 DIAGNOSIS — R0602 Shortness of breath: Secondary | ICD-10-CM | POA: Diagnosis not present

## 2023-03-16 DIAGNOSIS — R059 Cough, unspecified: Secondary | ICD-10-CM | POA: Insufficient documentation

## 2023-03-16 MED ORDER — ALBUTEROL SULFATE HFA 108 (90 BASE) MCG/ACT IN AERS: 2.0000 | INHALATION_SPRAY | RESPIRATORY_TRACT | Status: DC | PRN

## 2023-03-16 NOTE — ED Triage Notes (Signed)
Pt c/o cough, sweats, dizziness and just feeling poorly.  Pt states he has not been able to sleep b/c of the cough.  Pt states it felt like he had a cold a week ago, all they symptoms have resolved except the cough.

## 2023-03-17 ENCOUNTER — Encounter: Payer: Self-pay | Admitting: Family Medicine

## 2023-03-17 ENCOUNTER — Ambulatory Visit (INDEPENDENT_AMBULATORY_CARE_PROVIDER_SITE_OTHER): Payer: Medicare PPO | Admitting: Family Medicine

## 2023-03-17 ENCOUNTER — Emergency Department (HOSPITAL_COMMUNITY): Payer: Medicare PPO

## 2023-03-17 VITALS — BP 116/58 | HR 71 | Temp 99.6°F | Wt 185.2 lb

## 2023-03-17 DIAGNOSIS — J22 Unspecified acute lower respiratory infection: Secondary | ICD-10-CM | POA: Diagnosis not present

## 2023-03-17 DIAGNOSIS — R0981 Nasal congestion: Secondary | ICD-10-CM

## 2023-03-17 DIAGNOSIS — R0602 Shortness of breath: Secondary | ICD-10-CM | POA: Diagnosis not present

## 2023-03-17 DIAGNOSIS — R059 Cough, unspecified: Secondary | ICD-10-CM | POA: Diagnosis not present

## 2023-03-17 DIAGNOSIS — J029 Acute pharyngitis, unspecified: Secondary | ICD-10-CM | POA: Diagnosis not present

## 2023-03-17 DIAGNOSIS — R509 Fever, unspecified: Secondary | ICD-10-CM

## 2023-03-17 LAB — CBC
HCT: 40.4 % (ref 39.0–52.0)
Hemoglobin: 13.7 g/dL (ref 13.0–17.0)
MCHC: 33.9 g/dL (ref 30.0–36.0)
MCV: 87.4 fl (ref 78.0–100.0)
Platelets: 231 10*3/uL (ref 150.0–400.0)
RBC: 4.63 Mil/uL (ref 4.22–5.81)
RDW: 15.1 % (ref 11.5–15.5)
WBC: 6.3 10*3/uL (ref 4.0–10.5)

## 2023-03-17 LAB — POCT RAPID STREP A (OFFICE): Rapid Strep A Screen: NEGATIVE

## 2023-03-17 MED ORDER — BENZONATATE 100 MG PO CAPS: 100.0000 mg | ORAL_CAPSULE | Freq: Three times a day (TID) | ORAL | 0 refills | Status: DC | PRN

## 2023-03-17 MED ORDER — DOXYCYCLINE HYCLATE 100 MG PO TABS: 100.0000 mg | ORAL_TABLET | Freq: Two times a day (BID) | ORAL | 0 refills | Status: DC

## 2023-03-17 NOTE — ED Notes (Signed)
Pt stated that the wait was too long and he was going to go to his doctor. Pt also stated he is "reporting [facility] to his insurance company." Pt seen leaving the ED.

## 2023-03-17 NOTE — Patient Instructions (Addendum)
Start antibiotic, Mucinex or Mucinex DM over-the-counter as needed.  Tessalon Perles if needed for cough, recheck in the next 3 to 4 days if not significantly better, sooner if worse. Cough, Adult Coughing is a reflex that clears your throat and airways (respiratory system). It helps heal and protect your lungs. It is normal to cough from time to time. A cough that happens with other symptoms or that lasts a long time may be a sign of a condition that needs treatment. A short-term (acute) cough may only last 2-3 weeks. A long-term (chronic) cough may last 8 or more weeks. Coughing is often caused by: Diseases, such as: An infection of the respiratory system. Asthma or other heart or lung diseases. Gastroesophageal reflux. This is when acid comes back up from the stomach. Breathing in things that irritate your lungs. Allergies. Postnasal drip. This is when mucus runs down the back of your throat. Smoking. Some medicines. Follow these instructions at home: Medicines Take over-the-counter and prescription medicines only as told by your health care provider. Talk with your provider before you take cough medicine (cough suppressants). Eating and drinking Do not drink alcohol. Avoid caffeine. Drink enough fluid to keep your pee (urine) pale yellow. Lifestyle Avoid cigarette smoke. Do not use any products that contain nicotine or tobacco. These products include cigarettes, chewing tobacco, and vaping devices, such as e-cigarettes. If you need help quitting, ask your provider. Avoid things that make you cough. These may include perfumes, candles, cleaning products, or campfire smoke. General instructions  Watch for any changes to your cough. Tell your provider about them. Always cover your mouth when you cough. If the air is dry in your bedroom or home, use a cool mist vaporizer or humidifier. If your cough is worse at night, try to sleep in a semi-upright position. Rest as needed. Contact a  health care provider if: You have new symptoms, or your symptoms get worse. You cough up pus. You have a fever that does not go away or a cough that does not get better after 2-3 weeks. You cannot control your cough with medicine, and you are losing sleep. You have pain that gets worse or is not helped with medicine. You lose weight for no clear reason. You have night sweats. Get help right away if: You cough up blood. You have trouble breathing. Your heart is beating very fast. These symptoms may be an emergency. Get help right away. Call 911. Do not wait to see if the symptoms will go away. Do not drive yourself to the hospital. This information is not intended to replace advice given to you by your health care provider. Make sure you discuss any questions you have with your health care provider. Document Revised: 06/14/2022 Document Reviewed: 06/14/2022 Elsevier Patient Education  2023 [employer].

## 2023-03-17 NOTE — Progress Notes (Unsigned)
Subjective:  Patient ID: Peter Taylor, male    DOB: 06/12/1951  Age: 72 y.o. MRN: [id number]  CC:  Chief Complaint  Patient presents with   Cough    Pt c/o sx of sore throat, fever , nasal congestion and coughing started last Tuesday. Worsen when laying down. Took robitussin.  Coughing up yellow mucus with blood spot.    Sore Throat    With hoarseness.    Nasal Congestion    HPI Peter Taylor presents for   Cough, sore throat, congestion Started 6 days ago.  Sore throat. Felt like better last Thursday, then more sinus congestion and worse with yellow mucus with some blood tinge at times.  Some sore throat with hoarseness. Some dyspnea with activity only.  Fever last night - subjective only.  Drinking fluids, no confusion, no chest pain.  More cough with lying down, PND with congestion.   Home treatments: robitussin otc - helped last week.   Went to ER last night, had some testing but did not wait to be seen - there 5 hrs.  CXR  - no active cardiopulmonary disease.   History Patient Active Problem List   Diagnosis Date Noted   Gastroesophageal reflux disease without esophagitis 01/16/2023   Type 2 diabetes mellitus without complication, without long-term current use of insulin (HCC) 01/16/2023   Thyroid nodule 01/16/2023   Elevated PSA 01/16/2023   Benign prostatic hyperplasia 01/16/2023   Coronary artery disease    Hyperlipidemia 11/11/2019   Past Medical History:  Diagnosis Date   CAD (coronary artery disease), native coronary artery    a. Cath 12/10/19: 90% mLAD s/p DES, mild non obstructive disease in RCA and Cx   GERD (gastroesophageal reflux disease)    Hyperlipidemia    Substance abuse (HCC)    Tuberculosis    Past Surgical History:  Procedure Laterality Date   CORONARY STENT INTERVENTION N/A 12/10/2019   Procedure: CORONARY STENT INTERVENTION;  Surgeon: Corky Crafts, MD;  Location: [facility] INVASIVE [facility];  Service: Cardiovascular;  Laterality: N/A;    LEFT HEART CATH AND CORONARY ANGIOGRAPHY N/A 12/10/2019   Procedure: LEFT HEART CATH AND CORONARY ANGIOGRAPHY;  Surgeon: Corky Crafts, MD;  Location: [facility] INVASIVE CV LAB;  Service: Cardiovascular;  Laterality: N/A;   NO PAST SURGERIES     Allergies  Allergen Reactions   Penicillins Hives    Did it involve swelling of the face/tongue/throat, SOB, or low BP? No Did it involve sudden or severe rash/hives, skin peeling, or any reaction on the inside of your mouth or nose? No Did you need to seek medical attention at a hospital or doctor's office? No When did it last happen?      35+ years If all above answers are "NO", may proceed with cephalosporin use.    Prior to Admission medications   Medication Sig Start Date End Date Taking? Authorizing Provider  atorvastatin (LIPITOR) 40 MG tablet TAKE 1 TABLET(40 MG) BY MOUTH DAILY 01/16/23  Yes Shade Flood, MD  clopidogrel (PLAVIX) 75 MG tablet TAKE 1 TABLET(75 MG) BY MOUTH DAILY WITH BREAKFAST 01/16/23  Yes Shade Flood, MD  pantoprazole (PROTONIX) 40 MG tablet Take 1 tablet (40 mg total) by mouth daily. 01/16/23  Yes Shade Flood, MD  tamsulosin (FLOMAX) 0.4 MG CAPS capsule TAKE 1 CAPSULE(0.4 MG) BY MOUTH DAILY 01/16/23  Yes Shade Flood, MD   Social History   Socioeconomic History   Marital status: Single  Spouse name: Not on file   Number of children: 8   Years of education: Not on file   Highest education level: Not on file  Occupational History   Occupation: truck driver  Tobacco Use   Smoking status: Former   Smokeless tobacco: Former  Building services engineer Use: Never used  Substance and Sexual Activity   Alcohol use: Yes    Comment: occ   Drug use: No   Sexual activity: Not on file  Other Topics Concern   Not on file  Social History Narrative   Not on file   Social Determinants of Health   Financial Resource Strain: Low Risk  (04/18/2022)   Overall Financial Resource Strain (CARDIA)    Difficulty  of Paying Living Expenses: Not hard at all  Food Insecurity: No Food Insecurity (06/14/2022)   Hunger Vital Sign    Worried About Running Out of Food in the Last Year: Never true    Ran Out of Food in the Last Year: Never true  Transportation Needs: No Transportation Needs (06/14/2022)   PRAPARE - Administrator, Civil Service (Medical): No    Lack of Transportation (Non-Medical): No  Physical Activity: Insufficiently Active (04/18/2022)   Exercise Vital Sign    Days of Exercise per Week: 3 days    Minutes of Exercise per Session: 30 min  Stress: No Stress Concern Present (01/11/2020)   [employer] of Occupational Health - Occupational Stress Questionnaire    Feeling of Stress : Not at all  Social Connections: Moderately Integrated (04/18/2022)   Social Connection and Isolation Panel [NHANES]    Frequency of Communication with Friends and Family: Three times a week    Frequency of Social Gatherings with Friends and Family: Three times a week    Attends Religious Services: More than 4 times per year    Active Member of Clubs or Organizations: Yes    Attends Banker Meetings: 1 to 4 times per year    Marital Status: Never married  Intimate Partner Violence: Not At Risk (06/14/2022)   Humiliation, Afraid, Rape, and Kick questionnaire    Fear of Current or Ex-Partner: No    Emotionally Abused: No    Physically Abused: No    Sexually Abused: No    Review of Systems  Per HPI.  Objective:   Vitals:   03/17/23 1114  BP: (!) 116/58  Pulse: 71  Temp: 99.6 F (37.6 C)  TempSrc: Temporal  SpO2: 96%  Weight: 185 lb 3.2 oz (84 kg)     Physical Exam Vitals reviewed.  Constitutional:      Appearance: He is well-developed.  HENT:     Head: Normocephalic and atraumatic.     Right Ear: Tympanic membrane, ear canal and external ear normal.     Left Ear: Tympanic membrane, ear canal and external ear normal.     Nose: No rhinorrhea.     Comments: Sinuses  nontender.    Mouth/Throat:     Mouth: Mucous membranes are moist.     Pharynx: No oropharyngeal exudate or posterior oropharyngeal erythema.  Eyes:     Conjunctiva/sclera: Conjunctivae normal.     Pupils: Pupils are equal, round, and reactive to light.  Cardiovascular:     Rate and Rhythm: Normal rate and regular rhythm.     Heart sounds: Normal heart sounds. No murmur heard. Pulmonary:     Effort: Pulmonary effort is normal.     Breath sounds: Rhonchi (  Few coarse breath sounds on the right greater than left lower lobe but good effort, no distress, no wheeze.) present. No wheezing or rales.  Abdominal:     Palpations: Abdomen is soft.     Tenderness: There is no abdominal tenderness.  Musculoskeletal:     Cervical back: Neck supple.  Lymphadenopathy:     Cervical: No cervical adenopathy.  Skin:    General: Skin is warm and dry.     Findings: No rash.  Neurological:     Mental Status: He is alert and oriented to person, place, and time.  Psychiatric:        Behavior: Behavior normal.       Results for orders placed or performed in visit on 03/17/23  POC Rapid Strep A  Result Value Ref Range   Rapid Strep A Screen Negative Negative    Assessment & Plan:  CLAIR DIAB is a 72 y.o. male . Sore throat - Plan: POC Rapid Strep A  Cough, unspecified type  LRTI (lower respiratory tract infection) - Plan: CBC, doxycycline (VIBRA-TABS) 100 MG tablet  Sinus congestion - Plan: doxycycline (VIBRA-TABS) 100 MG tablet  Fever, unspecified fever cause  Check CBC with fever but recent chest x-ray yesterday through the ER did not show any infiltrate.  Few coarse breath sounds on exam, possible bronchitis or other lower respiratory tract infection, but did have some initial improvements last week with secondary sickening, suspicious for bacterial cause.  Sinusitis also in the differential.  -With penicillin allergy we will start doxycycline with close follow-up, Tessalon Perles or  Mucinex for cough, symptomatic care with RTC precautions.  ER precautions.  Meds ordered this encounter  Medications   doxycycline (VIBRA-TABS) 100 MG tablet    Sig: Take 1 tablet (100 mg total) by mouth 2 (two) times daily.    Dispense:  20 tablet    Refill:  0   benzonatate (TESSALON) 100 MG capsule    Sig: Take 1 capsule (100 mg total) by mouth 3 (three) times daily as needed for cough.    Dispense:  20 capsule    Refill:  0   Patient Instructions  Start antibiotic, Mucinex or Mucinex DM over-the-counter as needed.  Tessalon Perles if needed for cough, recheck in the next 3 to 4 days if not significantly better, sooner if worse. Cough, Adult Coughing is a reflex that clears your throat and airways (respiratory system). It helps heal and protect your lungs. It is normal to cough from time to time. A cough that happens with other symptoms or that lasts a long time may be a sign of a condition that needs treatment. A short-term (acute) cough may only last 2-3 weeks. A long-term (chronic) cough may last 8 or more weeks. Coughing is often caused by: Diseases, such as: An infection of the respiratory system. Asthma or other heart or lung diseases. Gastroesophageal reflux. This is when acid comes back up from the stomach. Breathing in things that irritate your lungs. Allergies. Postnasal drip. This is when mucus runs down the back of your throat. Smoking. Some medicines. Follow these instructions at home: Medicines Take over-the-counter and prescription medicines only as told by your health care provider. Talk with your provider before you take cough medicine (cough suppressants). Eating and drinking Do not drink alcohol. Avoid caffeine. Drink enough fluid to keep your pee (urine) pale yellow. Lifestyle Avoid cigarette smoke. Do not use any products that contain nicotine or tobacco. These products include cigarettes, chewing tobacco,  and vaping devices, such as e-cigarettes. If you  need help quitting, ask your provider. Avoid things that make you cough. These may include perfumes, candles, cleaning products, or campfire smoke. General instructions  Watch for any changes to your cough. Tell your provider about them. Always cover your mouth when you cough. If the air is dry in your bedroom or home, use a cool mist vaporizer or humidifier. If your cough is worse at night, try to sleep in a semi-upright position. Rest as needed. Contact a health care provider if: You have new symptoms, or your symptoms get worse. You cough up pus. You have a fever that does not go away or a cough that does not get better after 2-3 weeks. You cannot control your cough with medicine, and you are losing sleep. You have pain that gets worse or is not helped with medicine. You lose weight for no clear reason. You have night sweats. Get help right away if: You cough up blood. You have trouble breathing. Your heart is beating very fast. These symptoms may be an emergency. Get help right away. Call 911. Do not wait to see if the symptoms will go away. Do not drive yourself to the hospital. This information is not intended to replace advice given to you by your health care provider. Make sure you discuss any questions you have with your health care provider. Document Revised: 06/14/2022 Document Reviewed: 06/14/2022 Elsevier Patient Education  2023 Elsevier Inc.     Signed,   Meredith Staggers, MD [facility], [facility] 03/17/23 11:50 AM

## 2023-04-03 ENCOUNTER — Ambulatory Visit (INDEPENDENT_AMBULATORY_CARE_PROVIDER_SITE_OTHER): Payer: Medicare PPO | Admitting: Family Medicine

## 2023-04-03 ENCOUNTER — Encounter: Payer: Self-pay | Admitting: Family Medicine

## 2023-04-03 VITALS — BP 130/74 | HR 56 | Temp 98.2°F | Ht 71.0 in | Wt 188.4 lb

## 2023-04-03 DIAGNOSIS — K219 Gastro-esophageal reflux disease without esophagitis: Secondary | ICD-10-CM

## 2023-04-03 DIAGNOSIS — N400 Enlarged prostate without lower urinary tract symptoms: Secondary | ICD-10-CM | POA: Diagnosis not present

## 2023-04-03 DIAGNOSIS — I25810 Atherosclerosis of coronary artery bypass graft(s) without angina pectoris: Secondary | ICD-10-CM | POA: Diagnosis not present

## 2023-04-03 DIAGNOSIS — E119 Type 2 diabetes mellitus without complications: Secondary | ICD-10-CM

## 2023-04-03 MED ORDER — TAMSULOSIN HCL 0.4 MG PO CAPS: 0.8000 mg | ORAL_CAPSULE | Freq: Every day | ORAL | 1 refills | Status: DC

## 2023-04-03 MED ORDER — CLOPIDOGREL BISULFATE 75 MG PO TABS: ORAL_TABLET | ORAL | 1 refills | Status: DC

## 2023-04-03 NOTE — Patient Instructions (Signed)
Thanks for coming in today.  Increase walking, continue to watch diet choices.  We can recheck labs after follow-up visit in 3 months.  No medication changes today.  I did send in the higher dose of the tamsulosin.  Let me know if there are questions and take care.

## 2023-04-03 NOTE — Progress Notes (Signed)
Subjective:  Patient ID: Peter Taylor, male    DOB: [birth year]  Age: 72 y.o. MRN: [id number]  CC:  Chief Complaint  Patient presents with   Medical Management of Chronic Issues   Health Maintenance    Patient would like to discuss pneumonia vaccine     HPI [name] presents for  Follow up. Feeling better since last visit with sore throat - congestion improved.   Hyperlipidemia: With history of CAD, followed by cardiology, drug-eluting stent to LAD in 2021.  On Plavix.  Lipitor 40 mg daily.  Stable LDL in March. No chest pains/dyspnea or myalgias.   Lab Results  Component Value Date   CHOL 143 01/16/2023   HDL 58.50 01/16/2023   LDLCALC 63 01/16/2023   TRIG 108.0 01/16/2023   CHOLHDL 2 01/16/2023   Lab Results  Component Value Date   ALT 26 01/16/2023   AST 15 01/16/2023   ALKPHOS 129 (H) 01/16/2023   BILITOT 0.6 01/16/2023   Elevated PSA See prior notes, urology follow-up recommended and continued on Flomax for BPH.  Saw urology in April. Doubled flomax dose - improved. 3 month follow up planned.  No lightheadedness or dizziness.   GERD Protonix daily.  Improved at his April visit with dietary adjustments.  Continuing on PPI daily. Still doing well.   Diabetes: Dietary changes, exercise approach, borderline A1c in March. Diet the same. Some exercise at times. Plans increased walking.   Microalbumin: normal ratio in March.  Optho, foot exam, pneumovax:  Declines tdap, pneumovax, shingrix and pneumonia vaccine.   Lab Results  Component Value Date   HGBA1C 7.0 (H) 01/16/2023   HGBA1C 7.1 (H) 01/02/2022   HGBA1C 6.8 (H) 01/17/2021   Lab Results  Component Value Date   MICROALBUR 1.1 01/16/2023   LDLCALC 63 01/16/2023   CREATININE 1.27 01/16/2023    History Patient Active Problem List   Diagnosis Date Noted   Gastroesophageal reflux disease without esophagitis 01/16/2023   Type 2 diabetes mellitus without complication, without long-term current  use of insulin (HCC) 01/16/2023   Thyroid nodule 01/16/2023   Elevated PSA 01/16/2023   Benign prostatic hyperplasia 01/16/2023   Coronary artery disease    Hyperlipidemia 11/11/2019   Past Medical History:  Diagnosis Date   CAD (coronary artery disease), native coronary artery    a. Cath 12/10/19: 90% mLAD s/p DES, mild non obstructive disease in RCA and Cx   GERD (gastroesophageal reflux disease)    Hyperlipidemia    Substance abuse (HCC)    Tuberculosis    Past Surgical History:  Procedure Laterality Date   CORONARY STENT INTERVENTION N/A 12/10/2019   Procedure: CORONARY STENT INTERVENTION;  Surgeon: Corky Crafts, MD;  Location: [facility] INVASIVE CV LAB;  Service: Cardiovascular;  Laterality: N/A;   LEFT HEART CATH AND CORONARY ANGIOGRAPHY N/A 12/10/2019   Procedure: LEFT HEART CATH AND CORONARY ANGIOGRAPHY;  Surgeon: Corky Crafts, MD;  Location[facility] INVASIVE CV LAB;  Service: Cardiovascular;  Laterality: N/A;   NO PAST SURGERIES     Allergies  Allergen Reactions   Penicillins Hives    Did it involve swelling of the face/tongue/throat, SOB, or low BP? No Did it involve sudden or severe rash/hives, skin peeling, or any reaction on the inside of your mouth or nose? No Did you need to seek medical attention at a hospital or doctor's office? No When did it last happen?      35+ years If all above answers are "NO",  may proceed with cephalosporin use.    Prior to Admission medications   Medication Sig Start Date End Date Taking? Authorizing Provider  atorvastatin (LIPITOR) 40 MG tablet TAKE 1 TABLET(40 MG) BY MOUTH DAILY 01/16/23  Yes Shade Flood, MD  clopidogrel (PLAVIX) 75 MG tablet TAKE 1 TABLET(75 MG) BY MOUTH DAILY WITH BREAKFAST 01/16/23  Yes Shade Flood, MD  pantoprazole (PROTONIX) 40 MG tablet Take 1 tablet (40 mg total) by mouth daily. 01/16/23  Yes Shade Flood, MD  tamsulosin (FLOMAX) 0.4 MG CAPS capsule TAKE 1 CAPSULE(0.4 MG) BY MOUTH DAILY 01/16/23   Yes Shade Flood, MD  benzonatate (TESSALON) 100 MG capsule Take 1 capsule (100 mg total) by mouth 3 (three) times daily as needed for cough. Patient not taking: Reported on 04/03/2023 03/17/23   Shade Flood, MD  doxycycline (VIBRA-TABS) 100 MG tablet Take 1 tablet (100 mg total) by mouth 2 (two) times daily. Patient not taking: Reported on 04/03/2023 03/17/23   Shade Flood, MD   Social History   Socioeconomic History   Marital status: Single    Spouse name: Not on file   Number of children: 8   Years of education: Not on file   Highest education level: Not on file  Occupational History   Occupation: truck driver  Tobacco Use   Smoking status: Former   Smokeless tobacco: Former  Building services engineer Use: Never used  Substance and Sexual Activity   Alcohol use: Yes    Comment: occ   Drug use: No   Sexual activity: Not on file  Other Topics Concern   Not on file  Social History Narrative   Not on file   Social Determinants of Health   Financial Resource Strain: Low Risk  (04/18/2022)   Overall Financial Resource Strain (CARDIA)    Difficulty of Paying Living Expenses: Not hard at all  Food Insecurity: No Food Insecurity (06/14/2022)   Hunger Vital Sign    Worried About Running Out of Food in the Last Year: Never true    Ran Out of Food in the Last Year: Never true  Transportation Needs: No Transportation Needs (06/14/2022)   PRAPARE - Administrator, Civil Service (Medical): No    Lack of Transportation (Non-Medical): No  Physical Activity: Insufficiently Active (04/18/2022)   Exercise Vital Sign    Days of Exercise per Week: 3 days    Minutes of Exercise per Session: 30 min  Stress: No Stress Concern Present (01/11/2020)   [employer] of Occupational Health - Occupational Stress Questionnaire    Feeling of Stress : Not at all  Social Connections: Moderately Integrated (04/18/2022)   Social Connection and Isolation Panel [NHANES]    Frequency of  Communication with Friends and Family: Three times a week    Frequency of Social Gatherings with Friends and Family: Three times a week    Attends Religious Services: More than 4 times per year    Active Member of Clubs or Organizations: Yes    Attends Banker Meetings: 1 to 4 times per year    Marital Status: Never married  Intimate Partner Violence: Not At Risk (06/14/2022)   Humiliation, Afraid, Rape, and Kick questionnaire    Fear of Current or Ex-Partner: No    Emotionally Abused: No    Physically Abused: No    Sexually Abused: No    Review of Systems  Constitutional:  Negative for fatigue and unexpected weight change.  Eyes:  Negative for visual disturbance.  Respiratory:  Negative for cough, chest tightness and shortness of breath.   Cardiovascular:  Negative for chest pain, palpitations and leg swelling.  Gastrointestinal:  Negative for abdominal pain and blood in stool.  Neurological:  Negative for dizziness, light-headedness and headaches.     Objective:   Vitals:   04/03/23 0838  BP: 130/74  Pulse: (!) 56  Temp: 98.2 F (36.8 C)  TempSrc: Temporal  SpO2: 99%  Weight: 188 lb 6.4 oz (85.5 kg)  Height: 5\' 11"  (1.803 m)     Physical Exam Vitals reviewed.  Constitutional:      Appearance: He is well-developed.  HENT:     Head: Normocephalic and atraumatic.  Neck:     Vascular: No carotid bruit or JVD.  Cardiovascular:     Rate and Rhythm: Normal rate and regular rhythm.     Heart sounds: Normal heart sounds. No murmur heard. Pulmonary:     Effort: Pulmonary effort is normal.     Breath sounds: Normal breath sounds. No rales.  Musculoskeletal:     Right lower leg: No edema.     Left lower leg: No edema.  Skin:    General: Skin is warm and dry.  Neurological:     Mental Status: He is alert and oriented to person, place, and time.  Psychiatric:        Mood and Affect: Mood normal.        Assessment & Plan:  JUSTYN SAGONA is a 72  y.o. male . Gastroesophageal reflux disease without esophagitis  -Stable, continue PPI, trigger avoidance.  Benign prostatic hyperplasia, unspecified whether lower urinary tract symptoms present - Plan: tamsulosin (FLOMAX) 0.4 MG CAPS capsule  -Improved with higher dose of Flomax, denies any side effects, continue follow-up as planned with urology for recheck PSA, refilled Flomax at higher dose for now.  Coronary artery disease involving coronary bypass graft of native heart without angina pectoris - Plan: clopidogrel (PLAVIX) 75 MG tablet  -Asymptomatic, tolerating Plavix without new bleeding, continue same.  Previous lipids reassuring, continue same dose and 47-month follow-up for labs.  Type 2 diabetes mellitus without complication, without long-term current use of insulin (HCC)  -Has increased walking, will defer labs for 3 months as borderline A1c previously.  Hold on new meds for now.  Meds ordered this encounter  Medications   tamsulosin (FLOMAX) 0.4 MG CAPS capsule    Sig: Take 2 capsules (0.8 mg total) by mouth daily. TAKE 1 CAPSULE(0.4 MG) BY MOUTH DAILY    Dispense:  180 capsule    Refill:  1   clopidogrel (PLAVIX) 75 MG tablet    Sig: TAKE 1 TABLET(75 MG) BY MOUTH DAILY WITH BREAKFAST    Dispense:  90 tablet    Refill:  1   Patient Instructions  Thanks for coming in today.  Increase walking, continue to watch diet choices.  We can recheck labs after follow-up visit in 3 months.  No medication changes today.  I did send in the higher dose of the tamsulosin.  Let me know if there are questions and take care.     Signed,   Meredith Staggers, MD [facility], [facility] 04/03/23 9:25 AM

## 2023-04-09 ENCOUNTER — Other Ambulatory Visit: Payer: Self-pay | Admitting: Family Medicine

## 2023-04-09 DIAGNOSIS — N400 Enlarged prostate without lower urinary tract symptoms: Secondary | ICD-10-CM

## 2023-04-24 ENCOUNTER — Ambulatory Visit (INDEPENDENT_AMBULATORY_CARE_PROVIDER_SITE_OTHER): Payer: Medicare PPO | Admitting: *Deleted

## 2023-04-24 DIAGNOSIS — Z0000 Encounter for general adult medical examination without abnormal findings: Secondary | ICD-10-CM

## 2023-04-24 NOTE — Patient Instructions (Signed)
Peter Taylor , Thank you for taking time to come for your Medicare Wellness Visit. I appreciate your ongoing commitment to your health goals. Please review the following plan we discussed and let me know if I can assist you in the future.   Screening recommendations/referrals: Colonoscopy: up to date Recommended yearly ophthalmology/optometry visit for glaucoma screening and checkup Recommended yearly dental visit for hygiene and checkup  Vaccinations: Influenza vaccine: up to date Pneumococcal vaccine: Education provided Tdap vaccine: Education provided Shingles vaccine: Education provided    Advanced directives: Education provided      Preventive Care 65 Years and Older, Male Preventive care refers to lifestyle choices and visits with your health care provider that can promote health and wellness. What does preventive care include? A yearly physical exam. This is also called an annual well check. Dental exams once or twice a year. Routine eye exams. Ask your health care provider how often you should have your eyes checked. Personal lifestyle choices, including: Daily care of your teeth and gums. Regular physical activity. Eating a healthy diet. Avoiding tobacco and drug use. Limiting alcohol use. Practicing safe sex. Taking low doses of aspirin every day. Taking vitamin and mineral supplements as recommended by your health care provider. What happens during an annual well check? The services and screenings done by your health care provider during your annual well check will depend on your age, overall health, lifestyle risk factors, and family history of disease. Counseling  Your health care provider may ask you questions about your: Alcohol use. Tobacco use. Drug use. Emotional well-being. Home and relationship well-being. Sexual activity. Eating habits. History of falls. Memory and ability to understand (cognition). Work and work Astronomer. Screening  You may  have the following tests or measurements: Height, weight, and BMI. Blood pressure. Lipid and cholesterol levels. These may be checked every 5 years, or more frequently if you are over 54 years old. Skin check. Lung cancer screening. You may have this screening every year starting at age 66 if you have a 30-pack-year history of smoking and currently smoke or have quit within the past 15 years. Fecal occult blood test (FOBT) of the stool. You may have this test every year starting at age 29. Flexible sigmoidoscopy or colonoscopy. You may have a sigmoidoscopy every 5 years or a colonoscopy every 10 years starting at age 69. Prostate cancer screening. Recommendations will vary depending on your family history and other risks. Hepatitis C blood test. Hepatitis B blood test. Sexually transmitted disease (STD) testing. Diabetes screening. This is done by checking your blood sugar (glucose) after you have not eaten for a while (fasting). You may have this done every 1-3 years. Abdominal aortic aneurysm (AAA) screening. You may need this if you are a current or former smoker. Osteoporosis. You may be screened starting at age 19 if you are at high risk. Talk with your health care provider about your test results, treatment options, and if necessary, the need for more tests. Vaccines  Your health care provider may recommend certain vaccines, such as: Influenza vaccine. This is recommended every year. Tetanus, diphtheria, and acellular pertussis (Tdap, Td) vaccine. You may need a Td booster every 10 years. Zoster vaccine. You may need this after age 52. Pneumococcal 13-valent conjugate (PCV13) vaccine. One dose is recommended after age 48. Pneumococcal polysaccharide (PPSV23) vaccine. One dose is recommended after age 33. Talk to your health care provider about which screenings and vaccines you need and how often you need them. This  information is not intended to replace advice given to you by your  health care provider. Make sure you discuss any questions you have with your health care provider. Document Released: 11/10/2015 Document Revised: 07/03/2016 Document Reviewed: 08/15/2015 Elsevier Interactive Patient Education  2017 [place] Prevention in the Home Falls can cause injuries. They can happen to people of all ages. There are many things you can do to make your home safe and to help prevent falls. What can I do on the outside of my home? Regularly fix the edges of walkways and driveways and fix any cracks. Remove anything that might make you trip as you walk through a door, such as a raised step or threshold. Trim any bushes or trees on the path to your home. Use bright outdoor lighting. Clear any walking paths of anything that might make someone trip, such as rocks or tools. Regularly check to see if handrails are loose or broken. Make sure that both sides of any steps have handrails. Any raised decks and porches should have guardrails on the edges. Have any leaves, snow, or ice cleared regularly. Use sand or salt on walking paths during winter. Clean up any spills in your garage right away. This includes oil or grease spills. What can I do in the bathroom? Use night lights. Install grab bars by the toilet and in the tub and shower. Do not use towel bars as grab bars. Use non-skid mats or decals in the tub or shower. If you need to sit down in the shower, use a plastic, non-slip stool. Keep the floor dry. Clean up any water that spills on the floor as soon as it happens. Remove soap buildup in the tub or shower regularly. Attach bath mats securely with double-sided non-slip rug tape. Do not have throw rugs and other things on the floor that can make you trip. What can I do in the bedroom? Use night lights. Make sure that you have a light by your bed that is easy to reach. Do not use any sheets or blankets that are too big for your bed. They should not hang down  onto the floor. Have a firm chair that has side arms. You can use this for support while you get dressed. Do not have throw rugs and other things on the floor that can make you trip. What can I do in the kitchen? Clean up any spills right away. Avoid walking on wet floors. Keep items that you use a lot in easy-to-reach places. If you need to reach something above you, use a strong step stool that has a grab bar. Keep electrical cords out of the way. Do not use floor polish or wax that makes floors slippery. If you must use wax, use non-skid floor wax. Do not have throw rugs and other things on the floor that can make you trip. What can I do with my stairs? Do not leave any items on the stairs. Make sure that there are handrails on both sides of the stairs and use them. Fix handrails that are broken or loose. Make sure that handrails are as long as the stairways. Check any carpeting to make sure that it is firmly attached to the stairs. Fix any carpet that is loose or worn. Avoid having throw rugs at the top or bottom of the stairs. If you do have throw rugs, attach them to the floor with carpet tape. Make sure that you have a light switch at the top  of the stairs and the bottom of the stairs. If you do not have them, ask someone to add them for you. What else can I do to help prevent falls? Wear shoes that: Do not have high heels. Have rubber bottoms. Are comfortable and fit you well. Are closed at the toe. Do not wear sandals. If you use a stepladder: Make sure that it is fully opened. Do not climb a closed stepladder. Make sure that both sides of the stepladder are locked into place. Ask someone to hold it for you, if possible. Clearly mark and make sure that you can see: Any grab bars or handrails. First and last steps. Where the edge of each step is. Use tools that help you move around (mobility aids) if they are needed. These include: Canes. Walkers. Scooters. Crutches. Turn  on the lights when you go into a dark area. Replace any light bulbs as soon as they burn out. Set up your furniture so you have a clear path. Avoid moving your furniture around. If any of your floors are uneven, fix them. If there are any pets around you, be aware of where they are. Review your medicines with your doctor. Some medicines can make you feel dizzy. This can increase your chance of falling. Ask your doctor what other things that you can do to help prevent falls. This information is not intended to replace advice given to you by your health care provider. Make sure you discuss any questions you have with your health care provider. Document Released: 08/10/2009 Document Revised: 03/21/2016 Document Reviewed: 11/18/2014 Elsevier Interactive Patient Education  2017 [employer].

## 2023-04-24 NOTE — Progress Notes (Signed)
Subjective:   Peter Taylor is a 72 y.o. male who presents for Medicare Annual/Subsequent preventive examination.  Visit Complete: Virtual  I connected with  [name] on 04/24/23 by a audio enabled telemedicine application and verified that I am speaking with the correct person using two identifiers.  Patient Location: Home  Provider Location: Home Office  I discussed the limitations of evaluation and management by telemedicine. The patient expressed understanding and agreed to proceed.    Review of Systems     Cardiac Risk Factors include: advanced age (>35men, >39 women);male gender     Objective:    Today's Vitals   There is no height or weight on file to calculate BMI.     04/24/2023    1:29 PM 06/14/2022    9:16 AM 04/18/2022    8:54 AM 04/04/2021    9:03 AM 01/11/2020    9:33 AM 12/22/2019    4:08 PM 12/22/2019   11:44 AM  Advanced Directives  Does Patient Have a Medical Advance Directive? No No No No No No No  Would patient like information on creating a medical advance directive? No - Patient declined No - Patient declined No - Patient declined No - Patient declined Yes (MAU/Ambulatory/Procedural Areas - Information given) No - Patient declined No - Patient declined    Current Medications (verified) Outpatient Encounter Medications as of 04/24/2023  Medication Sig   atorvastatin (LIPITOR) 40 MG tablet TAKE 1 TABLET(40 MG) BY MOUTH DAILY   clopidogrel (PLAVIX) 75 MG tablet TAKE 1 TABLET(75 MG) BY MOUTH DAILY WITH BREAKFAST   pantoprazole (PROTONIX) 40 MG tablet Take 1 tablet (40 mg total) by mouth daily.   tamsulosin (FLOMAX) 0.4 MG CAPS capsule Take 2 capsules (0.8 mg total) by mouth daily. TAKE 1 CAPSULE(0.4 MG) BY MOUTH DAILY   benzonatate (TESSALON) 100 MG capsule Take 1 capsule (100 mg total) by mouth 3 (three) times daily as needed for cough. (Patient not taking: Reported on 04/03/2023)   doxycycline (VIBRA-TABS) 100 MG tablet Take 1 tablet (100 mg total)  by mouth 2 (two) times daily. (Patient not taking: Reported on 04/24/2023)   No facility-administered encounter medications on file as of 04/24/2023.    Allergies (verified) Penicillins   History: Past Medical History:  Diagnosis Date   CAD (coronary artery disease), native coronary artery    a. Cath 12/10/19: 90% mLAD s/p DES, mild non obstructive disease in RCA and Cx   GERD (gastroesophageal reflux disease)    Hyperlipidemia    Substance abuse (HCC)    Tuberculosis    Past Surgical History:  Procedure Laterality Date   CORONARY STENT INTERVENTION N/A 12/10/2019   Procedure: CORONARY STENT INTERVENTION;  Surgeon: Corky Crafts, MD;  Location: [facility] INVASIVE CV LAB;  Service: Cardiovascular;  Laterality: N/A;   LEFT HEART CATH AND CORONARY ANGIOGRAPHY N/A 12/10/2019   Procedure: LEFT HEART CATH AND CORONARY ANGIOGRAPHY;  Surgeon: Corky Crafts, MD;  Location: [facility] INVASIVE CV LAB;  Service: Cardiovascular;  Laterality: N/A;   NO PAST SURGERIES     Family History  Problem Relation Age of Onset   Cancer Mother        unsure type   Cancer Brother        pancreatic   Ovarian cancer Sister    Ovarian cancer Sister    Ovarian cancer Sister    Social History   Socioeconomic History   Marital status: Single    Spouse name: Not on file  Number of children: 8   Years of education: Not on file   Highest education level: Not on file  Occupational History   Occupation: truck driver  Tobacco Use   Smoking status: Former   Smokeless tobacco: Former  Building services engineer Use: Never used  Substance and Sexual Activity   Alcohol use: Yes    Comment: occ   Drug use: No   Sexual activity: Not Currently  Other Topics Concern   Not on file  Social History Narrative   Not on file   Social Determinants of Health   Financial Resource Strain: Low Risk  (04/24/2023)   Overall Financial Resource Strain (CARDIA)    Difficulty of Paying Living Expenses: Not hard at all  Food  Insecurity: No Food Insecurity (04/24/2023)   Hunger Vital Sign    Worried About Running Out of Food in the Last Year: Never true    Ran Out of Food in the Last Year: Never true  Transportation Needs: No Transportation Needs (04/24/2023)   PRAPARE - Administrator, Civil Service (Medical): No    Lack of Transportation (Non-Medical): No  Physical Activity: Inactive (04/24/2023)   Exercise Vital Sign    Days of Exercise per Week: 0 days    Minutes of Exercise per Session: 0 min  Stress: No Stress Concern Present (04/24/2023)   [employer] of Occupational Health - Occupational Stress Questionnaire    Feeling of Stress : Not at all  Social Connections: Unknown (04/24/2023)   Social Connection and Isolation Panel [NHANES]    Frequency of Communication with Friends and Family: More than three times a week    Frequency of Social Gatherings with Friends and Family: Never    Attends Religious Services: More than 4 times per year    Active Member of [employer] or Organizations: No    Attends Engineer, structural: Never    Marital Status: Not on file    Tobacco Counseling Counseling given: Not Answered   Clinical Intake:  Pre-visit preparation completed: Yes  Pain : No/denies pain     Diabetes: No  How often do you need to have someone help you when you read instructions, pamphlets, or other written materials from your doctor or pharmacy?: 1 - Never     Information entered by :: Remi Haggard LPN   Activities of Daily Living    04/24/2023    1:29 PM  In your present state of health, do you have any difficulty performing the following activities:  Hearing? 0  Vision? 0  Difficulty concentrating or making decisions? 0  Walking or climbing stairs? 0  Dressing or bathing? 0  Doing errands, shopping? 0  Preparing Food and eating ? N  Using the Toilet? N  In the past six months, have you accidently leaked urine? N  Do you have problems with loss of bowel  control? N  Managing your Medications? N  Managing your Finances? N  Housekeeping or managing your Housekeeping? N    Patient Care Team: Shade Flood, MD as PCP - General (Family Medicine) Corky Crafts, MD as PCP - Cardiology (Cardiology)  Indicate any recent Medical Services you may have received from other than [facility] providers in the past year (date may be approximate).     Assessment:   This is a routine wellness examination for Arsenio.  Hearing/Vision screen Hearing Screening - Comments:: No trouble hearing Vision Screening - Comments:: Not up to date   Dietary  issues and exercise activities discussed:     Goals Addressed   None    Depression Screen    04/24/2023    1:34 PM 04/03/2023    8:49 AM 02/06/2023    9:37 AM 01/16/2023   10:00 AM 06/14/2022    9:15 AM 04/18/2022    8:55 AM 04/18/2022    8:52 AM  PHQ 2/9 Scores  PHQ - 2 Score 0 0 0 0 0 0 0  PHQ- 9 Score 0 0 0 0       Fall Risk    04/24/2023    1:28 PM 04/03/2023    8:48 AM 02/06/2023    9:37 AM 01/16/2023    9:59 AM 04/18/2022    8:55 AM  Fall Risk   Falls in the past year? 0 0 0 0 0  Number falls in past yr: 0 0 0 0 0  Injury with Fall? 0 0 0 0 0  Risk for fall due to :  No Fall Risks No Fall Risks No Fall Risks   Follow up Falls evaluation completed;Education provided;Falls prevention discussed Falls evaluation completed  Falls evaluation completed Falls evaluation completed;Education provided    MEDICARE RISK AT HOME:  Medicare Risk at Home - 04/24/23 1328     Any stairs in or around the home? No    If so, are there any without handrails? No    Home free of loose throw rugs in walkways, pet beds, electrical cords, etc? Yes    Adequate lighting in your home to reduce risk of falls? Yes    Life alert? No    Use of a cane, walker or w/c? No    Grab bars in the bathroom? No    Shower chair or bench in shower? No    Elevated toilet seat or a handicapped toilet? No             TIMED  UP AND GO:  Was the test performed?  No    Cognitive Function:        04/24/2023    1:30 PM  6CIT Screen  What Year? 0 points  What month? 0 points  What time? 0 points  Count back from 20 2 points  Months in reverse 0 points  Repeat phrase 2 points  Total Score 4 points    Immunizations Immunization History  Administered Date(s) Administered   PFIZER(Purple Top)SARS-COV-2 Vaccination 01/01/2020, 01/22/2020, 10/03/2020    TDAP status: Due, Education has been provided regarding the importance of this vaccine. Advised may receive this vaccine at local pharmacy or Health Dept. Aware to provide a copy of the vaccination record if obtained from local pharmacy or Health Dept. Verbalized acceptance and understanding.  Flu Vaccine status: Up to date  Pneumococcal vaccine status: Due, Education has been provided regarding the importance of this vaccine. Advised may receive this vaccine at local pharmacy or Health Dept. Aware to provide a copy of the vaccination record if obtained from local pharmacy or Health Dept. Verbalized acceptance and understanding.  Covid-19 vaccine status: Information provided on how to obtain vaccines.   Qualifies for Shingles Vaccine? Yes   Zostavax completed No   Shingrix Completed?: No.    Education has been provided regarding the importance of this vaccine. Patient has been advised to call insurance company to determine out of pocket expense if they have not yet received this vaccine. Advised may also receive vaccine at local pharmacy or Health Dept. Verbalized acceptance and understanding.  Screening  Tests Health Maintenance  Topic Date Due   DTaP/Tdap/Td (1 - Tdap) Never done   Zoster Vaccines- Shingrix (1 of 2) Never done   Pneumonia Vaccine 5+ Years old (1 of 1 - PCV) Never done   COVID-19 Vaccine (4 - 2023-24 season) 06/28/2022   INFLUENZA VACCINE  05/29/2023   HEMOGLOBIN A1C  07/19/2023   Diabetic kidney evaluation - eGFR measurement   01/16/2024   Diabetic kidney evaluation - Urine ACR  01/16/2024   OPHTHALMOLOGY EXAM  02/05/2024   FOOT EXAM  02/06/2024   Medicare Annual Wellness (AWV)  04/23/2024   Colonoscopy  12/06/2026   Hepatitis C Screening  Completed   HPV VACCINES  Aged Out    Health Maintenance  Health Maintenance Due  Topic Date Due   DTaP/Tdap/Td (1 - Tdap) Never done   Zoster Vaccines- Shingrix (1 of 2) Never done   Pneumonia Vaccine 44+ Years old (1 of 1 - PCV) Never done   COVID-19 Vaccine (4 - 2023-24 season) 06/28/2022    Colorectal cancer screening: Type of screening: Colonoscopy. Completed 2021. Repeat every 7 years  Lung Cancer Screening: (Low Dose CT Chest recommended if Age 77-80 years, 20 pack-year currently smoking OR have quit w/in 15years.) does not qualify.   Lung Cancer Screening Referral:   Additional Screening:  Hepatitis C Screening: does not qualify; Completed 2022  Vision Screening: Recommended annual ophthalmology exams for early detection of glaucoma and other disorders of the eye. Is the patient up to date with their annual eye exam?  No  Who is the provider or what is the name of the office in which the patient attends annual eye exams?  If pt is not established with a provider, would they like to be referred to a provider to establish care? .   Dental Screening: Recommended annual dental exams for proper oral hygiene    Community Resource Referral / Chronic Care Management: CRR required this visit?  No   CCM required this visit?  No     Plan:     I have personally reviewed and noted the following in the patient's chart:   Medical and social history Use of alcohol, tobacco or illicit drugs  Current medications and supplements including opioid prescriptions. Patient is not currently taking opioid prescriptions. Functional ability and status Nutritional status Physical activity Advanced directives List of other physicians Hospitalizations, surgeries, and  ER visits in previous 12 months Vitals Screenings to include cognitive, depression, and falls Referrals and appointments  In addition, I have reviewed and discussed with patient certain preventive protocols, quality metrics, and best practice recommendations. A written personalized care plan for preventive services as well as general preventive health recommendations were provided to patient.     Remi Haggard, LPN   [id number]   After Visit Summary: (MyChart) Due to this being a telephonic visit, the after visit summary with patients personalized plan was offered to patient via MyChart   Nurse Notes:

## 2023-05-22 DIAGNOSIS — R972 Elevated prostate specific antigen [PSA]: Secondary | ICD-10-CM | POA: Diagnosis not present

## 2023-05-29 DIAGNOSIS — N401 Enlarged prostate with lower urinary tract symptoms: Secondary | ICD-10-CM | POA: Diagnosis not present

## 2023-05-29 DIAGNOSIS — N5201 Erectile dysfunction due to arterial insufficiency: Secondary | ICD-10-CM | POA: Diagnosis not present

## 2023-05-29 DIAGNOSIS — R3915 Urgency of urination: Secondary | ICD-10-CM | POA: Diagnosis not present

## 2023-05-29 DIAGNOSIS — R972 Elevated prostate specific antigen [PSA]: Secondary | ICD-10-CM | POA: Diagnosis not present

## 2023-06-12 ENCOUNTER — Encounter (INDEPENDENT_AMBULATORY_CARE_PROVIDER_SITE_OTHER): Payer: Self-pay

## 2023-07-04 ENCOUNTER — Ambulatory Visit: Payer: Medicare PPO | Admitting: Family Medicine

## 2023-07-07 ENCOUNTER — Ambulatory Visit (INDEPENDENT_AMBULATORY_CARE_PROVIDER_SITE_OTHER): Payer: Medicare PPO | Admitting: Family Medicine

## 2023-07-07 ENCOUNTER — Encounter: Payer: Self-pay | Admitting: Family Medicine

## 2023-07-07 VITALS — BP 128/72 | HR 52 | Temp 98.2°F | Ht 71.0 in | Wt 189.4 lb

## 2023-07-07 DIAGNOSIS — I25810 Atherosclerosis of coronary artery bypass graft(s) without angina pectoris: Secondary | ICD-10-CM

## 2023-07-07 DIAGNOSIS — N400 Enlarged prostate without lower urinary tract symptoms: Secondary | ICD-10-CM

## 2023-07-07 DIAGNOSIS — E782 Mixed hyperlipidemia: Secondary | ICD-10-CM

## 2023-07-07 DIAGNOSIS — K219 Gastro-esophageal reflux disease without esophagitis: Secondary | ICD-10-CM

## 2023-07-07 DIAGNOSIS — H5711 Ocular pain, right eye: Secondary | ICD-10-CM | POA: Diagnosis not present

## 2023-07-07 DIAGNOSIS — E041 Nontoxic single thyroid nodule: Secondary | ICD-10-CM | POA: Diagnosis not present

## 2023-07-07 DIAGNOSIS — E119 Type 2 diabetes mellitus without complications: Secondary | ICD-10-CM | POA: Diagnosis not present

## 2023-07-07 DIAGNOSIS — H0520 Unspecified exophthalmos: Secondary | ICD-10-CM | POA: Diagnosis not present

## 2023-07-07 LAB — COMPREHENSIVE METABOLIC PANEL
ALT: 42 U/L (ref 0–53)
AST: 27 U/L (ref 0–37)
Albumin: 4.4 g/dL (ref 3.5–5.2)
Alkaline Phosphatase: 176 U/L — ABNORMAL HIGH (ref 39–117)
BUN: 13 mg/dL (ref 6–23)
CO2: 30 meq/L (ref 19–32)
Calcium: 9.8 mg/dL (ref 8.4–10.5)
Chloride: 102 meq/L (ref 96–112)
Creatinine, Ser: 1.15 mg/dL (ref 0.40–1.50)
GFR: 64.71 mL/min (ref >60.00)
Glucose, Bld: 95 mg/dL (ref 70–99)
Potassium: 4.2 meq/L (ref 3.5–5.1)
Sodium: 140 meq/L (ref 135–145)
Total Bilirubin: 0.7 mg/dL (ref 0.2–1.2)
Total Protein: 7.8 g/dL (ref 6.0–8.3)

## 2023-07-07 LAB — LIPID PANEL
Cholesterol: 137 mg/dL (ref 0–200)
HDL: 48.3 mg/dL (ref >39.00)
LDL Cholesterol: 72 mg/dL (ref 0–99)
NonHDL: 88.61
Total CHOL/HDL Ratio: 3
Triglycerides: 84 mg/dL (ref 0.0–149.0)
VLDL: 16.8 mg/dL (ref 0.0–40.0)

## 2023-07-07 LAB — TSH: TSH: 1.94 u[IU]/mL (ref 0.35–5.50)

## 2023-07-07 LAB — HEMOGLOBIN A1C: Hgb A1c MFr Bld: 7.1 % — ABNORMAL HIGH (ref 4.6–6.5)

## 2023-07-07 MED ORDER — TAMSULOSIN HCL 0.4 MG PO CAPS: 0.8000 mg | ORAL_CAPSULE | Freq: Every day | ORAL | 1 refills | Status: DC

## 2023-07-07 MED ORDER — CLOPIDOGREL BISULFATE 75 MG PO TABS
ORAL_TABLET | ORAL | 1 refills | Status: DC
Start: 2023-07-07 — End: 2024-01-05

## 2023-07-07 MED ORDER — PANTOPRAZOLE SODIUM 40 MG PO TBEC: 40.0000 mg | DELAYED_RELEASE_TABLET | Freq: Every day | ORAL | 1 refills | Status: DC

## 2023-07-07 MED ORDER — ATORVASTATIN CALCIUM 40 MG PO TABS: ORAL_TABLET | ORAL | 1 refills | Status: DC

## 2023-07-07 NOTE — Progress Notes (Signed)
Subjective:  Patient ID: Peter Taylor, male    DOB: 10/07/1951  Age: 72 y.o. MRN: [id number]  CC:  Chief Complaint  Patient presents with   Medical Management of Chronic Issues    Pt is doing well,    Facial Swelling    Pt notes facial swelling on and off over the last 3 years, notes started after he was put on several medications doesn't know which directly correlates, wants to know if there is anything he can do for this     HPI Peter Taylor presents for  Follow-up, concerns above.  Eye swelling Intermittent symptoms past few years, R eye seems to be larger than left at times. No eyelid swelling, redness, or rash. Eye is irritated at times, watering at times.  Some soreness in R eye at times. No change in vision. Some blurring of vision at times for a few seconds after taking meds, then normalizes, no problems on left eye. No corrective lenses. No treatments. Has seen optho in past 6 months. Has not discussed with that provider. [employer]. Dr. Jenene Slicker. 02/11/23 exam.  Normal TSH in 2022. Followed by Dr. Gerrit Friends for multiple thyroid nodules. Appt 11/2022. FNA with granulomatous thyroiditis.   BPH Discussed in June, improved with higher dosing of Flomax at that time without side effects.  Plan for continued follow-up with urology, continue Flomax 0.8 mg daily.  Chronic stable PSA elevation, urology visit in August with 1 year follow-up planned.  Continue surveillance.  Diabetes: Diet/exercise was discussed at his last visit, borderline A1c in March.  No new meds at last visit.  He is on statin, Lipitor 40 mg daily.  No ACE inhibitor or ARB.no myalgias/side effects of meds.  History of CAD, on Plavix -DES in 2021, LAD.  Followed by cardiology. No home readings.   Microalbumin: nl ration 01/16/23 Optho, foot exam, pneumovax: up to date.   Lab Results  Component Value Date   HGBA1C 7.0 (H) 01/16/2023   HGBA1C 7.1 (H) 01/02/2022   HGBA1C 6.8 (H) 01/17/2021   Lab  Results  Component Value Date   MICROALBUR 1.1 01/16/2023   LDLCALC 63 01/16/2023   CREATININE 1.27 01/16/2023      History Patient Active Problem List   Diagnosis Date Noted   Gastroesophageal reflux disease without esophagitis 01/16/2023   Type 2 diabetes mellitus without complication, without long-term current use of insulin (HCC) 01/16/2023   Thyroid nodule 01/16/2023   Elevated PSA 01/16/2023   Benign prostatic hyperplasia 01/16/2023   Coronary artery disease    Hyperlipidemia 11/11/2019   Past Medical History:  Diagnosis Date   CAD (coronary artery disease), native coronary artery    a. Cath 12/10/19: 90% mLAD s/p DES, mild non obstructive disease in RCA and Cx   GERD (gastroesophageal reflux disease)    Hyperlipidemia    Substance abuse (HCC)    Tuberculosis    Past Surgical History:  Procedure Laterality Date   CORONARY STENT INTERVENTION N/A 12/10/2019   Procedure: CORONARY STENT INTERVENTION;  Surgeon: Corky Crafts, MD;  Location: [facility]AS[facility];  Service: Cardiovascular;  Laterality: N/A;   LEFT HEART CATH AND CORONARY ANGIOGRAPHY N/A 12/10/2019   Procedure: LEFT HEART CATH AND CORONARY ANGIOGRAPHY;  Surgeon: Corky Crafts, MD;  Location: [facility] INVASIVE CV LAB;  Service: Cardiovascular;  Laterality: N/A;   NO PAST SURGERIES     Allergies  Allergen Reactions   Penicillins Hives    Did it involve swelling of  the face/tongue/throat, SOB, or low BP? No Did it involve sudden or severe rash/hives, skin peeling, or any reaction on the inside of your mouth or nose? No Did you need to seek medical attention at a hospital or doctor's office? No When did it last happen?      35+ years If all above answers are "NO", may proceed with cephalosporin use.    Prior to Admission medications   Medication Sig Start Date End Date Taking? Authorizing Provider  atorvastatin (LIPITOR) 40 MG tablet TAKE 1 TABLET(40 MG) BY MOUTH DAILY 01/16/23   Shade Flood, MD   clopidogrel (PLAVIX) 75 MG tablet TAKE 1 TABLET(75 MG) BY MOUTH DAILY WITH BREAKFAST 04/03/23   Shade Flood, MD  pantoprazole (PROTONIX) 40 MG tablet Take 1 tablet (40 mg total) by mouth daily. 01/16/23   Shade Flood, MD  tamsulosin (FLOMAX) 0.4 MG CAPS capsule Take 2 capsules (0.8 mg total) by mouth daily. TAKE 1 CAPSULE(0.4 MG) BY MOUTH DAILY 04/03/23   Shade Flood, MD   Social History   Socioeconomic History   Marital status: Single    Spouse name: Not on file   Number of children: 8   Years of education: Not on file   Highest education level: Not on file  Occupational History   Occupation: truck driver  Tobacco Use   Smoking status: Former   Smokeless tobacco: Former  Building services engineer status: Never Used  Substance and Sexual Activity   Alcohol use: Yes    Comment: occ   Drug use: No   Sexual activity: Not Currently  Other Topics Concern   Not on file  Social History Narrative   Not on file   Social Determinants of Health   Financial Resource Strain: Low Risk  (04/24/2023)   Overall Financial Resource Strain (CARDIA)    Difficulty of Paying Living Expenses: Not hard at all  Food Insecurity: No Food Insecurity (04/24/2023)   Hunger Vital Sign    Worried About Running Out of Food in the Last Year: Never true    Ran Out of Food in the Last Year: Never true  Transportation Needs: No Transportation Needs (04/24/2023)   PRAPARE - Administrator, Civil Service (Medical): No    Lack of Transportation (Non-Medical): No  Physical Activity: Inactive (04/24/2023)   Exercise Vital Sign    Days of Exercise per Week: 0 days    Minutes of Exercise per Session: 0 min  Stress: No Stress Concern Present (04/24/2023)   [employer] of Occupational Health - Occupational Stress Questionnaire    Feeling of Stress : Not at all  Social Connections: Unknown (04/24/2023)   Social Connection and Isolation Panel [NHANES]    Frequency of Communication with Friends  and Family: More than three times a week    Frequency of Social Gatherings with Friends and Family: Never    Attends Religious Services: More than 4 times per year    Active Member of [employer] or Organizations: No    Attends Banker Meetings: Never    Marital Status: Not on file  Intimate Partner Violence: Not At Risk (04/24/2023)   Humiliation, Afraid, Rape, and Kick questionnaire    Fear of Current or Ex-Partner: No    Emotionally Abused: No    Physically Abused: No    Sexually Abused: No    Review of Systems   Objective:   Vitals:   07/07/23 1007  BP: 128/72  Pulse: Marland Kitchen)  52  Temp: 98.2 F (36.8 C)  TempSrc: Temporal  SpO2: 97%  Weight: 189 lb 6.4 oz (85.9 kg)  Height: 5\' 11"  (1.803 m)     Physical Exam Vitals reviewed.  Constitutional:      Appearance: He is well-developed.  HENT:     Head: Normocephalic and atraumatic.  Eyes:     Extraocular Movements: Extraocular movements intact.     Conjunctiva/sclera: Conjunctivae normal.     Pupils: Pupils are equal, round, and reactive to light.     Comments: Possible slight proptosis bilateral  Neck:     Vascular: No carotid bruit or JVD.  Cardiovascular:     Rate and Rhythm: Normal rate and regular rhythm.     Heart sounds: Normal heart sounds. No murmur heard. Pulmonary:     Effort: Pulmonary effort is normal.     Breath sounds: Normal breath sounds. No rales.  Musculoskeletal:     Right lower leg: No edema.     Left lower leg: No edema.  Skin:    General: Skin is warm and dry.  Neurological:     Mental Status: He is alert and oriented to person, place, and time.  Psychiatric:        Mood and Affect: Mood normal.        Assessment & Plan:  Peter Taylor is a 72 y.o. male . Type 2 diabetes mellitus without complication, without long-term current use of insulin (HCC) - Plan: Comprehensive metabolic panel, Hemoglobin A1c  -No current meds, check A1c, adjustment of plan accordingly.  Continue  statin.  Mixed hyperlipidemia - Plan: Comprehensive metabolic panel, Lipid panel  -Tolerating Lipitor, continue same dose, check labs and adjust plan accordingly.  Benign prostatic hyperplasia, unspecified whether lower urinary tract symptoms present - Plan: tamsulosin (FLOMAX) 0.4 MG CAPS capsule  -Stable symptom control without new side effects at 0.8 mg dosing of Flomax, continue same, recent urology eval as above with ongoing surveillance of elevated PSA.  Denies new symptoms.  Coronary artery disease involving coronary bypass graft of native heart without angina pectoris - Plan: atorvastatin (LIPITOR) 40 MG tablet, clopidogrel (PLAVIX) 75 MG tablet  -Asymptomatic, continue Plavix and follow-up with cardiology.  Gastroesophageal reflux disease without esophagitis - Plan: pantoprazole (PROTONIX) 40 MG tablet  -Stable, continue Protonix.  Denies new symptoms.  Eye pain, right Proptosis - Plan: TSH Thyroid nodule - Plan: TSH  -Possible slight proptosis bilaterally.  Intermittent eye discomfort on the right, exam appears normal today.  History of thyroid nodule with granulomatous tissue noted on FNA.  Recommended follow-up with his general surgeon, and ophthalmology for evaluation, check TSH.  Meds ordered this encounter  Medications   tamsulosin (FLOMAX) 0.4 MG CAPS capsule    Sig: Take 2 capsules (0.8 mg total) by mouth daily. TAKE 1 CAPSULE(0.4 MG) BY MOUTH DAILY    Dispense:  180 capsule    Refill:  1   atorvastatin (LIPITOR) 40 MG tablet    Sig: TAKE 1 TABLET(40 MG) BY MOUTH DAILY    Dispense:  90 tablet    Refill:  1   clopidogrel (PLAVIX) 75 MG tablet    Sig: TAKE 1 TABLET(75 MG) BY MOUTH DAILY WITH BREAKFAST    Dispense:  90 tablet    Refill:  1   pantoprazole (PROTONIX) 40 MG tablet    Sig: Take 1 tablet (40 mg total) by mouth daily.    Dispense:  90 tablet    Refill:  1   Patient  Instructions  Call eye specialist for appointment to discuss the right eye issues, and  for exam to see if they also think that eyes are pushing forward or more prominent.  I will check thyroid test today, and can also discuss this further with your general surgeon if needed.  I will also check your other labs, no new meds for now.  Eye specialist: Dr. Jenene Slicker - [phone]    Signed,   Meredith Staggers, MD [facility], [facility] 07/07/23 10:58 AM

## 2023-07-07 NOTE — Patient Instructions (Addendum)
Call eye specialist for appointment to discuss the right eye issues, and for exam to see if they also think that eyes are pushing forward or more prominent.  I will check thyroid test today, and can also discuss this further with your general surgeon if needed.  I will also check your other labs, no new meds for now.  Eye specialist: Dr. Jenene Slicker - [phone]

## 2023-07-16 ENCOUNTER — Ambulatory Visit: Payer: Medicare PPO | Admitting: Family Medicine

## 2023-07-17 ENCOUNTER — Ambulatory Visit (INDEPENDENT_AMBULATORY_CARE_PROVIDER_SITE_OTHER): Payer: Medicare PPO | Admitting: Family Medicine

## 2023-07-17 ENCOUNTER — Encounter: Payer: Self-pay | Admitting: Family Medicine

## 2023-07-17 ENCOUNTER — Telehealth: Payer: Self-pay

## 2023-07-17 ENCOUNTER — Other Ambulatory Visit: Payer: Self-pay

## 2023-07-17 VITALS — BP 124/72 | HR 69 | Temp 98.1°F | Wt 189.2 lb

## 2023-07-17 DIAGNOSIS — S00412A Abrasion of left ear, initial encounter: Secondary | ICD-10-CM | POA: Diagnosis not present

## 2023-07-17 DIAGNOSIS — R748 Abnormal levels of other serum enzymes: Secondary | ICD-10-CM

## 2023-07-17 DIAGNOSIS — H0520 Unspecified exophthalmos: Secondary | ICD-10-CM

## 2023-07-17 MED ORDER — NEOMYCIN-POLYMYXIN-HC 3.5-10000-1 OT SOLN
3.0000 [drp] | Freq: Three times a day (TID) | OTIC | 0 refills | Status: AC
Start: 2023-07-17 — End: 2023-07-24

## 2023-07-17 NOTE — Progress Notes (Signed)
Subjective:  Patient ID: Peter Taylor, male    DOB: October 30, 1950  Age: 72 y.o. MRN: [id number]  CC:  Chief Complaint  Patient presents with   Ear Concern    Noticed bump inside of L ear about a week ago; Pt states it is very painful when touched. Pt has been taking tylenol for pain, when med wears off, pain comes back.     HPI Peter Taylor presents for   Lump inside left ear Present for past approx 10 days. Bumped with Qtip when cleaning ear - sore but no bleeding, discharge. No change in hearing.  Hearing ok.  Tx: none, tylenol for pain with some relief.   Proptosis: Possible slight proptosis noted at his last visit, recommended ophthalmology follow-up, he did have an eye provider but states that they do not follow-up on that condition.  Also recommended following up with his general surgeon regarding history of thyroid nodules.  Normal TSH last visit. Lab Results  Component Value Date   TSH 1.94 07/07/2023    History Patient Active Problem List   Diagnosis Date Noted   Gastroesophageal reflux disease without esophagitis 01/16/2023   Type 2 diabetes mellitus without complication, without long-term current use of insulin (HCC) 01/16/2023   Thyroid nodule 01/16/2023   Elevated PSA 01/16/2023   Benign prostatic hyperplasia 01/16/2023   Coronary artery disease    Hyperlipidemia 11/11/2019   Past Medical History:  Diagnosis Date   CAD (coronary artery disease), native coronary artery    a. Cath 12/10/19: 90% mLAD s/p DES, mild non obstructive disease in RCA and Cx   GERD (gastroesophageal reflux disease)    Hyperlipidemia    Substance abuse (HCC)    Tuberculosis    Past Surgical History:  Procedure Laterality Date   CORONARY STENT INTERVENTION N/A 12/10/2019   Procedure: CORONARY STENT INTERVENTION;  Surgeon: Corky Crafts, MD;  Location: [facility] INVASIVE CV LAB;  Service: Cardiovascular;  Laterality: N/A;   LEFT HEART CATH AND CORONARY ANGIOGRAPHY N/A 12/10/2019    Procedure: LEFT HEART CATH AND CORONARY ANGIOGRAPHY;  Surgeon: Corky Crafts, MD;  Location: [facility] INVASIVE CV LAB;  Service: Cardiovascular;  Laterality: N/A;   NO PAST SURGERIES     Allergies  Allergen Reactions   Penicillins Hives    Did it involve swelling of the face/tongue/throat, SOB, or low BP? No Did it involve sudden or severe rash/hives, skin peeling, or any reaction on the inside of your mouth or nose? No Did you need to seek medical attention at a hospital or doctor's office? No When did it last happen?      35+ years If all above answers are "NO", may proceed with cephalosporin use.    Prior to Admission medications   Medication Sig Start Date End Date Taking? Authorizing Provider  atorvastatin (LIPITOR) 40 MG tablet TAKE 1 TABLET(40 MG) BY MOUTH DAILY 07/07/23  Yes Shade Flood, MD  clopidogrel (PLAVIX) 75 MG tablet TAKE 1 TABLET(75 MG) BY MOUTH DAILY WITH BREAKFAST 07/07/23  Yes Shade Flood, MD  pantoprazole (PROTONIX) 40 MG tablet Take 1 tablet (40 mg total) by mouth daily. 07/07/23  Yes Shade Flood, MD  tamsulosin (FLOMAX) 0.4 MG CAPS capsule Take 2 capsules (0.8 mg total) by mouth daily. TAKE 1 CAPSULE(0.4 MG) BY MOUTH DAILY 07/07/23  Yes Shade Flood, MD   Social History   Socioeconomic History   Marital status: Single    Spouse name: Not on file  Number of children: 8   Years of education: Not on file   Highest education level: Not on file  Occupational History   Occupation: truck driver  Tobacco Use   Smoking status: Former   Smokeless tobacco: Former  Building services engineer status: Never Used  Substance and Sexual Activity   Alcohol use: Yes    Comment: occ   Drug use: No   Sexual activity: Not Currently  Other Topics Concern   Not on file  Social History Narrative   Not on file   Social Determinants of Health   Financial Resource Strain: Low Risk  (04/24/2023)   Overall Financial Resource Strain (CARDIA)    Difficulty of Paying  Living Expenses: Not hard at all  Food Insecurity: No Food Insecurity (04/24/2023)   Hunger Vital Sign    Worried About Running Out of Food in the Last Year: Never true    Ran Out of Food in the Last Year: Never true  Transportation Needs: No Transportation Needs (04/24/2023)   PRAPARE - Administrator, Civil Service (Medical): No    Lack of Transportation (Non-Medical): No  Physical Activity: Inactive (04/24/2023)   Exercise Vital Sign    Days of Exercise per Week: 0 days    Minutes of Exercise per Session: 0 min  Stress: No Stress Concern Present (04/24/2023)   [employer] of Occupational Health - Occupational Stress Questionnaire    Feeling of Stress : Not at all  Social Connections: Unknown (04/24/2023)   Social Connection and Isolation Panel [NHANES]    Frequency of Communication with Friends and Family: More than three times a week    Frequency of Social Gatherings with Friends and Family: Never    Attends Religious Services: More than 4 times per year    Active Member of [employer] or Organizations: No    Attends Banker Meetings: Never    Marital Status: Not on file  Intimate Partner Violence: Not At Risk (04/24/2023)   Humiliation, Afraid, Rape, and Kick questionnaire    Fear of Current or Ex-Partner: No    Emotionally Abused: No    Physically Abused: No    Sexually Abused: No    Review of Systems Per HPI.   Objective:   Vitals:   07/17/23 0817  BP: 124/72  Pulse: 69  Temp: 98.1 F (36.7 C)  SpO2: 98%  Weight: 189 lb 3.2 oz (85.8 kg)     Physical Exam Constitutional:      General: He is not in acute distress.    Appearance: Normal appearance. He is well-developed.  HENT:     Head: Normocephalic and atraumatic.     Right Ear: Tympanic membrane, ear canal and external ear normal. There is no impacted cerumen.     Left Ear: Tympanic membrane and external ear normal. There is no impacted cerumen.     Ears:     Comments: Minimal erythema  of left canal with possible minimal edema but borderline.  No wounds, no lesions identified.  No surrounding lymphadenopathy.  Minimal discomfort with traction of pinna. Cardiovascular:     Rate and Rhythm: Normal rate.  Pulmonary:     Effort: Pulmonary effort is normal.  Neurological:     Mental Status: He is alert and oriented to person, place, and time.  Psychiatric:        Mood and Affect: Mood normal.     Assessment & Plan:  Peter Taylor is a 72 y.o.  male . Ear canal abrasion, left, initial encounter - Plan: neomycin-polymyxin-hydrocortisone (CORTISPORIN) OTIC solution  -Minimal irritation as above, avoid use of Q-tips, start Cortisporin otic solution for 5 to 7 days with RTC precautions.  Proptosis  -Appears to have slight proptosis bilaterally, discussed last visit.  Reportedly his ophthalmologist would not evaluate for this condition, will need to reach out to their office and check into this further to determine next step.  Meds ordered this encounter  Medications   neomycin-polymyxin-hydrocortisone (CORTISPORIN) OTIC solution    Sig: Place 3 drops into the left ear 3 (three) times daily for 7 days.    Dispense:  10 mL    Refill:  0   Patient Instructions  Overall the left ear canal appears okay.  I do not see a specific bump but possible slight irritation or swelling of the canal which may prevent a small abrasion.  The eardrum looks okay.  Avoid use of Q-tips within the ear canals.  Can use the Cortisporin otic drops few times per day for the next 5 to 7 days to see if that helps.  If any worsening pain, discharge, or change in hearing be seen.  If symptoms or not improving, be seen.   I will need to check with your ophthalmology office to find out about the concern with evaluation of your eyes and we can let you know once we find out more info.  Thanks for coming in today.    Signed,   Meredith Staggers, MD [facility], [facility] 07/17/23 8:55 AM

## 2023-07-17 NOTE — Telephone Encounter (Signed)
-----   Message from Shade Flood sent at 07/16/2023  7:58 PM EDT ----- Results sent by MyChart, but please call and make sure he received results and schedule lab visit for repeat LFTs in the next few weeks.  Diagnosis elevated alkaline phosphatase.  Let me know if there are questions

## 2023-07-17 NOTE — Patient Instructions (Signed)
Overall the left ear canal appears okay.  I do not see a specific bump but possible slight irritation or swelling of the canal which may prevent a small abrasion.  The eardrum looks okay.  Avoid use of Q-tips within the ear canals.  Can use the Cortisporin otic drops few times per day for the next 5 to 7 days to see if that helps.  If any worsening pain, discharge, or change in hearing be seen.  If symptoms or not improving, be seen.   I will need to check with your ophthalmology office to find out about the concern with evaluation of your eyes and we can let you know once we find out more info.  Thanks for coming in today.

## 2023-07-28 ENCOUNTER — Other Ambulatory Visit (INDEPENDENT_AMBULATORY_CARE_PROVIDER_SITE_OTHER): Payer: Medicare PPO

## 2023-07-28 DIAGNOSIS — R748 Abnormal levels of other serum enzymes: Secondary | ICD-10-CM | POA: Diagnosis not present

## 2023-07-28 LAB — ALKALINE PHOSPHATASE: Alkaline Phosphatase: 285 U/L — ABNORMAL HIGH (ref 39–117)

## 2023-08-05 ENCOUNTER — Telehealth: Payer: Self-pay

## 2023-08-05 ENCOUNTER — Other Ambulatory Visit: Payer: Self-pay | Admitting: Family Medicine

## 2023-08-05 DIAGNOSIS — R748 Abnormal levels of other serum enzymes: Secondary | ICD-10-CM

## 2023-08-05 NOTE — Telephone Encounter (Signed)
-----   Message from Shade Flood sent at 08/05/2023  8:09 AM EDT ----- Call patient.  Alkaline phosphatase was higher than previous reading, which appears to be trending upward.  This can be elevated for various reasons, sometimes from liver, sometimes from bone.  Will need to initially make that determination.  Please schedule a lab only visit for a GGT level and if that is elevated we will then proceed with ultrasound of the liver.  If it is normal we can discuss other testing at that time.  If he has any new symptoms should be seen.  Let me know if there are questions.

## 2023-08-05 NOTE — Progress Notes (Signed)
See lab notes

## 2023-08-05 NOTE — Telephone Encounter (Signed)
Spoke to patient he is aware of his labs and states no new symptoms at this time. He did not have any questions. I scheduled him for a lab visit and confirmed that future orders were in

## 2023-08-06 ENCOUNTER — Other Ambulatory Visit (INDEPENDENT_AMBULATORY_CARE_PROVIDER_SITE_OTHER): Payer: Medicare PPO

## 2023-08-06 DIAGNOSIS — R748 Abnormal levels of other serum enzymes: Secondary | ICD-10-CM | POA: Diagnosis not present

## 2023-08-06 LAB — GAMMA GT: GGT: 201 U/L — ABNORMAL HIGH (ref 7–51)

## 2023-08-08 ENCOUNTER — Other Ambulatory Visit: Payer: Self-pay | Admitting: Family Medicine

## 2023-08-08 DIAGNOSIS — R748 Abnormal levels of other serum enzymes: Secondary | ICD-10-CM

## 2023-08-08 NOTE — Progress Notes (Signed)
See labs, elevated GGT with alk phos, check right upper quadrant ultrasound.

## 2023-08-13 ENCOUNTER — Ambulatory Visit
Admission: RE | Admit: 2023-08-13 | Discharge: 2023-08-13 | Disposition: A | Discharge status: Home / Self Care | Payer: Medicare PPO | Source: Ambulatory Visit | Attending: Family Medicine | Admitting: Family Medicine

## 2023-08-13 DIAGNOSIS — K769 Liver disease, unspecified: Secondary | ICD-10-CM | POA: Diagnosis not present

## 2023-08-13 DIAGNOSIS — R748 Abnormal levels of other serum enzymes: Secondary | ICD-10-CM

## 2023-08-18 ENCOUNTER — Other Ambulatory Visit: Payer: Self-pay

## 2023-08-18 ENCOUNTER — Encounter (HOSPITAL_COMMUNITY): Payer: Self-pay | Admitting: Emergency Medicine

## 2023-08-18 ENCOUNTER — Emergency Department (HOSPITAL_COMMUNITY): Payer: Medicare PPO

## 2023-08-18 ENCOUNTER — Emergency Department (HOSPITAL_COMMUNITY)
Admission: EM | Admit: 2023-08-18 | Discharge: 2023-08-18 | Disposition: A | Discharge status: Home / Self Care | Payer: Medicare PPO | Attending: Emergency Medicine | Admitting: Emergency Medicine

## 2023-08-18 DIAGNOSIS — K5730 Diverticulosis of large intestine without perforation or abscess without bleeding: Secondary | ICD-10-CM | POA: Diagnosis not present

## 2023-08-18 DIAGNOSIS — Z7902 Long term (current) use of antithrombotics/antiplatelets: Secondary | ICD-10-CM | POA: Diagnosis not present

## 2023-08-18 DIAGNOSIS — N400 Enlarged prostate without lower urinary tract symptoms: Secondary | ICD-10-CM | POA: Insufficient documentation

## 2023-08-18 DIAGNOSIS — M62830 Muscle spasm of back: Secondary | ICD-10-CM | POA: Insufficient documentation

## 2023-08-18 DIAGNOSIS — M545 Low back pain, unspecified: Secondary | ICD-10-CM | POA: Diagnosis present

## 2023-08-18 DIAGNOSIS — Y990 Civilian activity done for income or pay: Secondary | ICD-10-CM | POA: Insufficient documentation

## 2023-08-18 DIAGNOSIS — W1830XA Fall on same level, unspecified, initial encounter: Secondary | ICD-10-CM | POA: Diagnosis not present

## 2023-08-18 DIAGNOSIS — I2510 Atherosclerotic heart disease of native coronary artery without angina pectoris: Secondary | ICD-10-CM | POA: Insufficient documentation

## 2023-08-18 DIAGNOSIS — E119 Type 2 diabetes mellitus without complications: Secondary | ICD-10-CM | POA: Insufficient documentation

## 2023-08-18 DIAGNOSIS — R109 Unspecified abdominal pain: Secondary | ICD-10-CM | POA: Diagnosis not present

## 2023-08-18 MED ORDER — NAPROXEN 375 MG PO TABS: 375.0000 mg | ORAL_TABLET | Freq: Two times a day (BID) | ORAL | 0 refills | Status: DC

## 2023-08-18 MED ORDER — METHOCARBAMOL 500 MG PO TABS
500.0000 mg | ORAL_TABLET | Freq: Two times a day (BID) | ORAL | 0 refills | Status: DC | PRN
Start: 2023-08-18 — End: 2023-09-10

## 2023-08-18 MED ORDER — KETOROLAC TROMETHAMINE 60 MG/2ML IM SOLN
30.0000 mg | Freq: Once | INTRAMUSCULAR | Status: AC
  Administered 2023-08-18: 30 mg via INTRAMUSCULAR
  Filled 2023-08-18: qty 2

## 2023-08-18 NOTE — Discharge Instructions (Signed)
In addition to your prescribed naproxen and Robaxin, you may use over-the-counter Acetaminophen (Tylenol), topical muscle creams such as SalonPas, [employer]ogul, Bengay, etc. Please stretch, apply ice or heat (whichever helps), and have massage therapy for additional assistance.

## 2023-08-18 NOTE — ED Provider Notes (Signed)
[facility] AT [facility] Provider Note  CSN: [id number] Arrival date & time: 08/18/23 4098  Chief Complaint(s) Back Pain  HPI Peter Taylor is a 72 y.o. male with a past medical history listed below who who presents for 2 weeks of right sided mid and lower back pain.  This occurred after a mechanical fall while at work.  Initially the pain was mild and nagging, improving with over-the-counter medicine.  Several days ago the pain became more severe.  Patient states that the pain is worse while lying flat and certain movements.  Improves by sitting up and standing.  He denies any bladder/bowel incontinence.  No lower extremity weakness or loss of sensation.  On review of records, MRI in 2022 showed enlarged prostate with lesion concerning for cancer.  Patient has had chronically elevated PSAs.  Noted to have elevated alk phos recently.   Back Pain   Past Medical History Past Medical History:  Diagnosis Date   CAD (coronary artery disease), native coronary artery    a. Cath 12/10/19: 90% mLAD s/p DES, mild non obstructive disease in RCA and Cx   GERD (gastroesophageal reflux disease)    Hyperlipidemia    Substance abuse (HCC)    Tuberculosis    Patient Active Problem List   Diagnosis Date Noted   Gastroesophageal reflux disease without esophagitis 01/16/2023   Type 2 diabetes mellitus without complication, without long-term current use of insulin (HCC) 01/16/2023   Thyroid nodule 01/16/2023   Elevated PSA 01/16/2023   Benign prostatic hyperplasia 01/16/2023   Coronary artery disease    Hyperlipidemia 11/11/2019   Home Medication(s) Prior to Admission medications   Medication Sig Start Date End Date Taking? Authorizing Provider  methocarbamol (ROBAXIN) 500 MG tablet Take 1-2 tablets (500-1,000 mg total) by mouth 2 (two) times daily as needed for muscle spasms. 08/18/23  Yes Johnwesley Lederman, Amadeo Garnet, MD  naproxen (NAPROSYN) 375 MG tablet Take 1  tablet (375 mg total) by mouth 2 (two) times daily. 08/18/23  Yes Jamarea Selner, Amadeo Garnet, MD  atorvastatin (LIPITOR) 40 MG tablet TAKE 1 TABLET(40 MG) BY MOUTH DAILY 07/07/23   Shade Flood, MD  clopidogrel (PLAVIX) 75 MG tablet TAKE 1 TABLET(75 MG) BY MOUTH DAILY WITH BREAKFAST 07/07/23   Shade Flood, MD  pantoprazole (PROTONIX) 40 MG tablet Take 1 tablet (40 mg total) by mouth daily. 07/07/23   Shade Flood, MD  tamsulosin (FLOMAX) 0.4 MG CAPS capsule Take 2 capsules (0.8 mg total) by mouth daily. TAKE 1 CAPSULE(0.4 MG) BY MOUTH DAILY 07/07/23   Shade Flood, MD                                                                                                                                    Allergies Penicillins  Review of Systems Review of Systems  Musculoskeletal:  Positive for back pain.   As noted in HPI  Physical Exam Vital Signs  I have reviewed the triage vital signs BP (!) 156/93   Pulse 89   Temp 98.2 F (36.8 C)   Resp 17   Wt 85.8 kg   SpO2 98%   BMI 26.38 kg/m   Physical Exam Vitals reviewed.  Constitutional:      General: He is not in acute distress.    Appearance: He is well-developed. He is not diaphoretic.  HENT:     Head: Normocephalic and atraumatic.     Right Ear: External ear normal.     Left Ear: External ear normal.     Nose: Nose normal.     Mouth/Throat:     Mouth: Mucous membranes are moist.  Eyes:     General: No scleral icterus.    Conjunctiva/sclera: Conjunctivae normal.  Neck:     Trachea: Phonation normal.  Cardiovascular:     Rate and Rhythm: Normal rate and regular rhythm.  Pulmonary:     Effort: Pulmonary effort is normal. No respiratory distress.     Breath sounds: No stridor.  Abdominal:     General: There is no distension.  Musculoskeletal:        General: Normal range of motion.     Cervical back: Normal range of motion.     Thoracic back: Spasms present. No tenderness or bony tenderness.     Lumbar back:  Spasms present. No tenderness or bony tenderness.     Comments: Pain with twisting.  Spine Exam: Strength: 5/5 throughout LE bilaterally  Sensation: Intact to light touch in proximal and distal LE bilaterally  Neurological:     Mental Status: He is alert and oriented to person, place, and time.  Psychiatric:        Behavior: Behavior normal.     ED Results and Treatments Labs (all labs ordered are listed, but only abnormal results are displayed) Labs Reviewed - No data to display                                                                                                                       EKG  EKG Interpretation Date/Time:    Ventricular Rate:    PR Interval:    QRS Duration:    QT Interval:    QTC Calculation:   R Axis:      Text Interpretation:         Radiology CT Renal Stone Study  Result Date: 08/18/2023 CLINICAL DATA:  Abdominal/flank pain after fall on rocks 2 weeks ago. On Plavix EXAM: CT ABDOMEN AND PELVIS WITHOUT CONTRAST TECHNIQUE: Multidetector CT imaging of the abdomen and pelvis was performed following the standard protocol without IV contrast. RADIATION DOSE REDUCTION: This exam was performed according to the departmental dose-optimization program which includes automated exposure control, adjustment of the mA and/or kV according to patient size and/or use of iterative reconstruction technique. COMPARISON:  None Available. FINDINGS: Lower chest: Streaky density in the lower lungs, greater on the right and consistent  with atelectasis. Hepatobiliary: No focal liver abnormality.No evidence of biliary obstruction or stone. Pancreas: Unremarkable. Spleen: Unremarkable. Adrenals/Urinary Tract: Negative adrenals. No hydronephrosis or stone. Unremarkable bladder. Stomach/Bowel: No obstruction. No appendicitis. Generalized colonic diverticulosis. Vascular/Lymphatic: No acute vascular abnormality. No mass or adenopathy. Reproductive:Very enlarged prostate up lifting  the bladder base, nearly 6 cm in anterior to posterior span. Other: No ascites or pneumoperitoneum. Musculoskeletal: No acute abnormalities. Generalized spondylitic spurring. L3-4 and L4-5 foraminal impingement. IMPRESSION: 1. No acute finding.  No hydronephrosis or nephrolithiasis. 2. Enlarged prostate, atherosclerosis, and innumerable colonic diverticula. Electronically Signed   By: Tiburcio Pea M.D.   On: 08/18/2023 04:53    Medications Ordered in ED Medications  ketorolac (TORADOL) injection 30 mg (30 mg Intramuscular Given 08/18/23 0519)   Procedures Procedures  (including critical care time) Medical Decision Making / ED Course   Medical Decision Making Amount and/or Complexity of Data Reviewed Radiology: ordered and independent interpretation performed. Decision-making details documented in ED Course.  Risk Prescription drug management.    Patient presents with mid and lower back pain.  No radicular symptoms.  No evidence concerning for cauda equina.  Given patient's history of concerning prostate MRI with elevated PSAs and alk phos, will get imaging to rule out bony lesions/pathologic fracture.  This will also assess for renal stones.  CT negative for any acute lytic lesions.  No renal stones. Significant improvement in pain with IM Toradol.  Favor muscle strain/spasm.    Final Clinical Impression(s) / ED Diagnoses Final diagnoses:  Muscle spasm of back   The patient appears reasonably screened and/or stabilized for discharge and I doubt any other medical condition or other [facility] requiring further screening, evaluation, or treatment in the ED at this time. I have discussed the findings, Dx and Tx plan with the patient/family who expressed understanding and agree(s) with the plan. Discharge instructions discussed at length. The patient/family was given strict return precautions who verbalized understanding of the instructions. No further questions at time of  discharge.  Disposition: Discharge  Condition: Good  ED Discharge Orders          Ordered    naproxen (NAPROSYN) 375 MG tablet  2 times daily        08/18/23 0552    methocarbamol (ROBAXIN) 500 MG tablet  2 times daily PRN        08/18/23 1610             Follow Up: Shade Flood, MD [address] [place] [address] [phone]  Call  to schedule an appointment for close follow up  Jerilee Field, MD [address] [place] [address] [phone]  Call  to schedule an appointment for close follow up    This chart was dictated using voice recognition software.  Despite best efforts to proofread,  errors can occur which can change the documentation meaning.    Nira Conn, MD 08/18/23 [phone]

## 2023-08-18 NOTE — ED Triage Notes (Signed)
Pt in with pain to R lower ribs after fall on rocks 2 wks ago. Pt takes Plavix, states the pain began today, denies any sob

## 2023-08-20 ENCOUNTER — Telehealth: Payer: Self-pay

## 2023-08-20 DIAGNOSIS — K769 Liver disease, unspecified: Secondary | ICD-10-CM

## 2023-08-20 NOTE — Telephone Encounter (Signed)
-----   Message from Shade Flood sent at 08/20/2023 10:28 AM EDT ----- Results sent by MyChart, but appears patient has not yet reviewed those results.  Please call and make sure they have either seen note or discuss result note.  Thanks.

## 2023-08-21 NOTE — Telephone Encounter (Signed)
Left vm to call our office

## 2023-08-21 NOTE — Telephone Encounter (Signed)
Response note received: "Pt reports fall on RT side Friday before Ultra sound  Pt would like you to call him  Pt would like a Ultra sound in 6 months"  I do see the ER visit on October 21 and medication was prescribed for back pain at that time.  I do not see any information about a recent fall.  Please clarify with patient, has he had a fall since that ER visit?   Plan from ER visit was also to follow up with me.  Next appointment on the schedule appears to be in March.  Please schedule appointment to be seen within the next 1 week for that ER follow-up but if any acute issues or recent fall with new injuries should be seen sooner.

## 2023-08-22 NOTE — Telephone Encounter (Signed)
Pt states he was seen in ER due to fall. Pt was questioning if the Ultra sound results could of had something to do with his fall? Pt is scheduled for 08/27/2023

## 2023-08-22 NOTE — Telephone Encounter (Signed)
Pt called back and stated he doesn't have MyChart

## 2023-08-22 NOTE — Addendum Note (Signed)
Addended by: Meredith Staggers R on: 08/22/2023 01:37 PM   Modules accepted: Orders

## 2023-08-22 NOTE — Telephone Encounter (Signed)
Ultrasound results should not have anything to do with his fall.  Can discuss further if needed at his October 30 visit.  I have ordered the ultrasound to repeat in 6 months.  Thanks

## 2023-08-27 ENCOUNTER — Encounter: Payer: Self-pay | Admitting: Family Medicine

## 2023-08-27 ENCOUNTER — Ambulatory Visit (INDEPENDENT_AMBULATORY_CARE_PROVIDER_SITE_OTHER): Payer: Medicare PPO | Admitting: Family Medicine

## 2023-08-27 VITALS — BP 126/62 | HR 64 | Temp 98.4°F | Ht 71.0 in | Wt 188.4 lb

## 2023-08-27 DIAGNOSIS — M545 Low back pain, unspecified: Secondary | ICD-10-CM | POA: Diagnosis not present

## 2023-08-27 DIAGNOSIS — E119 Type 2 diabetes mellitus without complications: Secondary | ICD-10-CM

## 2023-08-27 DIAGNOSIS — M546 Pain in thoracic spine: Secondary | ICD-10-CM

## 2023-08-27 MED ORDER — LANCET DEVICE MISC: 1.0000 | Freq: Every day | 0 refills | Status: AC

## 2023-08-27 MED ORDER — LANCETS MISC. MISC: 1.0000 | Freq: Every day | 0 refills | Status: AC

## 2023-08-27 MED ORDER — BLOOD GLUCOSE MONITORING SUPPL DEVI: 1.0000 | Freq: Every day | 0 refills | Status: AC

## 2023-08-27 MED ORDER — PREDNISONE 20 MG PO TABS
40.0000 mg | ORAL_TABLET | Freq: Every day | ORAL | 0 refills | Status: DC
Start: 2023-08-27 — End: 2023-09-10

## 2023-08-27 MED ORDER — BLOOD GLUCOSE TEST VI STRP: 1.0000 | ORAL_STRIP | Freq: Every day | 0 refills | Status: AC

## 2023-08-27 NOTE — Progress Notes (Signed)
Subjective:  Patient ID: Peter Taylor, male    DOB: 16-Nov-1950  Age: 72 y.o. MRN: [id number]  CC:  Chief Complaint  Patient presents with   Hospitalization Follow-up    Pt reports Rt sided back and abdomen pain was given some medication at the ER and notes those do not work, hot showers seem to help relieve some of the pain, sitting is the best position for the patient notes trying to lay down is impossible  Started with a fall onto his Rt side     HPI JOSEAN SEGOVIA presents for   Hospital/ER follow-up. Chart reviewed. ER visit on 08/18/2023.  Reported 2 weeks of right-sided mid and lower back pain after mechanical fall while at work.  Had reported that time the pain was mild and improved with over-the-counter medicine until few days prior to ER visit where it became more severe.  Worse with lying flat and certain movements, improved by sitting and standing.  Denied any lower extremity weakness, bowel or bladder incontinence.  In ER there was a mention of his history of enlarged prostate, elevated alk phos, but did have elevated GGT indicating likely liver source.  1.6 cm echogenic mass in left hepatic lobe of the liver on October 16 ultrasound, possible hemangioma, plan for recheck ultrasound in 6 months.  Increased echogenicity suggestive of hepatic steatosis.  Has been seen by urology with ongoing monitoring PSA and prostate exam, visit in August.  In ER he underwent a CT renal stone study.  No acute findings, hydronephrosis or nephrolithiasis.  No acute lytic lesions.  They did note generalized spondylitic spurring with L3-4 and L4-5 foraminal impingement but no acute abnormalities.  Pain improved in ER with IM Toradol, suspected muscle strain or spasm of the back.  Discharged on naproxen and Robaxin.  Since ER visit - still having some pain in R back - moves form shoulder blade, upper back, to R hip. No leg radiation. Sore to go to sleep.  Tried heating pad, hot shower that has  helped. No relief with mm relaxer. No change in pain with naproxen.  Toradol helped back pain in ER.  No weakness.   Pursuing workers comp for injury. Not covered yet.   No bowel or bladder incontinence, no saddle anesthesia, no lower extremity weakness.  No fevers, weight loss or night sweats.  Pain moves around to the side at times. Lab Results  Component Value Date   HGBA1C 7.1 (H) 07/07/2023  Glucose 95 on 07/07/23.    History Patient Active Problem List   Diagnosis Date Noted   Gastroesophageal reflux disease without esophagitis 01/16/2023   Type 2 diabetes mellitus without complication, without long-term current use of insulin (HCC) 01/16/2023   Thyroid nodule 01/16/2023   Elevated PSA 01/16/2023   Benign prostatic hyperplasia 01/16/2023   Coronary artery disease    Hyperlipidemia 11/11/2019   Past Medical History:  Diagnosis Date   CAD (coronary artery disease), native coronary artery    a. Cath 12/10/19: 90% mLAD s/p DES, mild non obstructive disease in RCA and Cx   GERD (gastroesophageal reflux disease)    Hyperlipidemia    Substance abuse (HCC)    Tuberculosis    Past Surgical History:  Procedure Laterality Date   CORONARY STENT INTERVENTION N/A 12/10/2019   Procedure: CORONARY STENT INTERVENTION;  Surgeon: Corky Crafts, MD;  Location: [facility] INVASIVE CV LAB;  Service: Cardiovascular;  Laterality: N/A;   LEFT HEART CATH AND CORONARY ANGIOGRAPHY N/A 12/10/2019  Procedure: LEFT HEART CATH AND CORONARY ANGIOGRAPHY;  Surgeon: Corky Crafts, MD;  Location: [facility] INVASIVE CV LAB;  Service: Cardiovascular;  Laterality: N/A;   NO PAST SURGERIES     Allergies  Allergen Reactions   Penicillins Hives    Did it involve swelling of the face/tongue/throat, SOB, or low BP? No Did it involve sudden or severe rash/hives, skin peeling, or any reaction on the inside of your mouth or nose? No Did you need to seek medical attention at a hospital or doctor's office? No When did  it last happen?      35+ years If all above answers are "NO", may proceed with cephalosporin use.    Prior to Admission medications   Medication Sig Start Date End Date Taking? Authorizing Provider  atorvastatin (LIPITOR) 40 MG tablet TAKE 1 TABLET(40 MG) BY MOUTH DAILY 07/07/23  Yes Shade Flood, MD  clopidogrel (PLAVIX) 75 MG tablet TAKE 1 TABLET(75 MG) BY MOUTH DAILY WITH BREAKFAST 07/07/23  Yes Shade Flood, MD  methocarbamol (ROBAXIN) 500 MG tablet Take 1-2 tablets (500-1,000 mg total) by mouth 2 (two) times daily as needed for muscle spasms. 08/18/23  Yes Cardama, Amadeo Garnet, MD  naproxen (NAPROSYN) 375 MG tablet Take 1 tablet (375 mg total) by mouth 2 (two) times daily. 08/18/23  Yes Cardama, Amadeo Garnet, MD  pantoprazole (PROTONIX) 40 MG tablet Take 1 tablet (40 mg total) by mouth daily. 07/07/23  Yes Shade Flood, MD  tamsulosin (FLOMAX) 0.4 MG CAPS capsule Take 2 capsules (0.8 mg total) by mouth daily. TAKE 1 CAPSULE(0.4 MG) BY MOUTH DAILY 07/07/23  Yes Shade Flood, MD   Social History   Socioeconomic History   Marital status: Single    Spouse name: Not on file   Number of children: 8   Years of education: Not on file   Highest education level: Not on file  Occupational History   Occupation: truck driver  Tobacco Use   Smoking status: Former   Smokeless tobacco: Former  Building services engineer status: Never Used  Substance and Sexual Activity   Alcohol use: Yes    Comment: occ   Drug use: No   Sexual activity: Not Currently  Other Topics Concern   Not on file  Social History Narrative   Not on file   Social Determinants of Health   Financial Resource Strain: Low Risk  (04/24/2023)   Overall Financial Resource Strain (CARDIA)    Difficulty of Paying Living Expenses: Not hard at all  Food Insecurity: No Food Insecurity (04/24/2023)   Hunger Vital Sign    Worried About Running Out of Food in the Last Year: Never true    Ran Out of Food in the Last  Year: Never true  Transportation Needs: No Transportation Needs (04/24/2023)   PRAPARE - Administrator, Civil Service (Medical): No    Lack of Transportation (Non-Medical): No  Physical Activity: Inactive (04/24/2023)   Exercise Vital Sign    Days of Exercise per Week: 0 days    Minutes of Exercise per Session: 0 min  Stress: No Stress Concern Present (04/24/2023)   [employer] of Occupational Health - Occupational Stress Questionnaire    Feeling of Stress : Not at all  Social Connections: Unknown (04/24/2023)   Social Connection and Isolation Panel [NHANES]    Frequency of Communication with Friends and Family: More than three times a week    Frequency of Social Gatherings with Friends and  Family: Never    Attends Religious Services: More than 4 times per year    Active Member of Clubs or Organizations: No    Attends Banker Meetings: Never    Marital Status: Not on file  Intimate Partner Violence: Not At Risk (04/24/2023)   Humiliation, Afraid, Rape, and Kick questionnaire    Fear of Current or Ex-Partner: No    Emotionally Abused: No    Physically Abused: No    Sexually Abused: No    Review of Systems   Objective:   Vitals:   08/27/23 1121  BP: 126/62  Pulse: 64  Temp: 98.4 F (36.9 C)  TempSrc: Temporal  SpO2: 99%  Weight: 188 lb 6.4 oz (85.5 kg)  Height: 5\' 11"  (1.803 m)     Physical Exam Constitutional:      General: He is not in acute distress.    Appearance: Normal appearance. He is well-developed.  HENT:     Head: Normocephalic and atraumatic.  Cardiovascular:     Rate and Rhythm: Normal rate.  Pulmonary:     Effort: Pulmonary effort is normal.  Musculoskeletal:     Comments: Thoracic and lumbar spine, no midline bony tenderness.  Reproducible discomfort over the rhomboid and just inferior to the scapula on the right.  Paraspinal tenderness inferior to the right paraspinals.  No focal rib tenderness.  No crepitus.  Negative  seated straight leg raise, ambulating without assistive device.  Slight decreased lumbar flexion, reproducible discomfort with right greater than left lateral flexion and right rotation of the lumbar and thoracic spine.  Neurological:     Mental Status: He is alert and oriented to person, place, and time.  Psychiatric:        Mood and Affect: Mood normal.        Assessment & Plan:  JARAAD FUGITT is a 72 y.o. male . Acute right-sided thoracic back pain - Plan: predniSONE (DELTASONE) 20 MG tablet, Ambulatory referral to Physical Therapy  Acute right-sided low back pain without sciatica - Plan: predniSONE (DELTASONE) 20 MG tablet, Ambulatory referral to Physical Therapy  Type 2 diabetes mellitus without complication, without long-term current use of insulin (HCC) - Plan: Blood Glucose Monitoring Suppl DEVI, Glucose Blood (BLOOD GLUCOSE TEST STRIPS) STRP, Lancet Device MISC, Lancets Misc. MISC   Upper, lower back pain as above, no concerning symptoms or findings on exam.  Hold on further imaging at this time.  Previous imaging with likely neuronal impingement.  Trial of prednisone with potential side effects discussed, short course.  Close monitoring of blood sugars with history of diabetes with RTC precautions.  Refer to physical therapy, consider Ortho eval with RTC precautions if any worsening.  Recheck 1 month.  Meds ordered this encounter  Medications   predniSONE (DELTASONE) 20 MG tablet    Sig: Take 2 tablets (40 mg total) by mouth daily with breakfast.    Dispense:  10 tablet    Refill:  0   Blood Glucose Monitoring Suppl DEVI    Sig: 1 each by Does not apply route daily in the afternoon. May substitute to any manufacturer covered by patient's insurance.    Dispense:  1 each    Refill:  0   Glucose Blood (BLOOD GLUCOSE TEST STRIPS) STRP    Sig: 1 each by In Vitro route daily. May substitute to any manufacturer covered by patient's insurance.    Dispense:  100 strip    Refill:   0   Lancet Device  MISC    Sig: 1 each by Does not apply route daily. May substitute to any manufacturer covered by patient's insurance.    Dispense:  1 each    Refill:  0   Lancets Misc. MISC    Sig: 1 each by Does not apply route daily. May substitute to any manufacturer covered by patient's insurance.    Dispense:  100 each    Refill:  0   Patient Instructions  Back pain could be related to a pinched nerve and spasm from your fall.  Since the muscle relaxant is not helping I think that would be okay to stop at this time.  Can also stop the naproxen that was prescribed by the emergency room and start prednisone 2 pills/day for the next 5 days.  That can sometimes increase appetite as well as increased blood sugars to check her blood sugar once per day and if any readings over 300 let me know right away.  I do not expect that to happen.  Once you have completed the prednisone, can return to the naproxen sodium if that is helpful.  I am referring you to physical therapy.  Recheck in 1 month, sooner if any new or worsening symptoms.  Hang in there.  Acute Back Pain, Adult Acute back pain is sudden and usually short-lived. It is often caused by an injury to the muscles and tissues in the back. The injury may result from: A muscle, tendon, or ligament getting overstretched or torn. Ligaments are tissues that connect bones to each other. Lifting something improperly can cause a back strain. Wear and tear (degeneration) of the spinal disks. Spinal disks are circular tissue that provide cushioning between the bones of the spine (vertebrae). Twisting motions, such as while playing sports or doing yard work. A hit to the back. Arthritis. You may have a physical exam, lab tests, and imaging tests to find the cause of your pain. Acute back pain usually goes away with rest and home care. Follow these instructions at home: Managing pain, stiffness, and swelling Take over-the-counter and prescription  medicines only as told by your health care provider. Treatment may include medicines for pain and inflammation that are taken by mouth or applied to the skin, or muscle relaxants. Your health care provider may recommend applying ice during the first 24-48 hours after your pain starts. To do this: Put ice in a plastic bag. Place a towel between your skin and the bag. Leave the ice on for 20 minutes, 2-3 times a day. Remove the ice if your skin turns bright red. This is very important. If you cannot feel pain, heat, or cold, you have a greater risk of damage to the area. If directed, apply heat to the affected area as often as told by your health care provider. Use the heat source that your health care provider recommends, such as a moist heat pack or a heating pad. Place a towel between your skin and the heat source. Leave the heat on for 20-30 minutes. Remove the heat if your skin turns bright red. This is especially important if you are unable to feel pain, heat, or cold. You have a greater risk of getting burned. Activity  Do not stay in bed. Staying in bed for more than 1-2 days can delay your recovery. Sit up and stand up straight. Avoid leaning forward when you sit or hunching over when you stand. If you work at a desk, sit close to it so you do  not need to lean over. Keep your chin tucked in. Keep your neck drawn back, and keep your elbows bent at a 90-degree angle (right angle). Sit high and close to the steering wheel when you drive. Add lower back (lumbar) support to your car seat, if needed. Take short walks on even surfaces as soon as you are able. Try to increase the length of time you walk each day. Do not sit, drive, or stand in one place for more than 30 minutes at a time. Sitting or standing for long periods of time can put stress on your back. Do not drive or use heavy machinery while taking prescription pain medicine. Use proper lifting techniques. When you bend and lift, use  positions that put less stress on your back: [place] your knees. Keep the load close to your body. Avoid twisting. Exercise regularly as told by your health care provider. Exercising helps your back heal faster and helps prevent back injuries by keeping muscles strong and flexible. Work with a physical therapist to make a safe exercise program, as recommended by your health care provider. Do any exercises as told by your physical therapist. Lifestyle Maintain a healthy weight. Extra weight puts stress on your back and makes it difficult to have good posture. Avoid activities or situations that make you feel anxious or stressed. Stress and anxiety increase muscle tension and can make back pain worse. Learn ways to manage anxiety and stress, such as through exercise. General instructions Sleep on a firm mattress in a comfortable position. Try lying on your side with your knees slightly bent. If you lie on your back, put a pillow under your knees. Keep your head and neck in a straight line with your spine (neutral position) when using electronic equipment like smartphones or pads. To do this: Raise your smartphone or pad to look at it instead of bending your head or neck to look down. Put the smartphone or pad at the level of your face while looking at the screen. Follow your treatment plan as told by your health care provider. This may include: Cognitive or behavioral therapy. Acupuncture or massage therapy. Meditation or yoga. Contact a health care provider if: You have pain that is not relieved with rest or medicine. You have increasing pain going down into your legs or buttocks. Your pain does not improve after 2 weeks. You have pain at night. You lose weight without trying. You have a fever or chills. You develop nausea or vomiting. You develop abdominal pain. Get help right away if: You develop new bowel or bladder control problems. You have unusual weakness or numbness in your arms or  legs. You feel faint. These symptoms may represent a serious problem that is an emergency. Do not wait to see if the symptoms will go away. Get medical help right away. Call your local emergency services (911 in the [place] Do not drive yourself to the hospital. Summary Acute back pain is sudden and usually short-lived. Use proper lifting techniques. When you bend and lift, use positions that put less stress on your back. Take over-the-counter and prescription medicines only as told by your health care provider, and apply heat or ice as told. This information is not intended to replace advice given to you by your health care provider. Make sure you discuss any questions you have with your health care provider. Document Revised: 01/05/2021 Document Reviewed: 01/05/2021 Elsevier Patient Education  2024 [employer].     Signed,   Meredith Staggers,  MD [facility], [facility] 08/27/23 11:38 AM

## 2023-08-27 NOTE — Patient Instructions (Signed)
Back pain could be related to a pinched nerve and spasm from your fall.  Since the muscle relaxant is not helping I think that would be okay to stop at this time.  Can also stop the naproxen that was prescribed by the emergency room and start prednisone 2 pills/day for the next 5 days.  That can sometimes increase appetite as well as increased blood sugars to check her blood sugar once per day and if any readings over 300 let me know right away.  I do not expect that to happen.  Once you have completed the prednisone, can return to the naproxen sodium if that is helpful.  I am referring you to physical therapy.  Recheck in 1 month, sooner if any new or worsening symptoms.  Hang in there.  Acute Back Pain, Adult Acute back pain is sudden and usually short-lived. It is often caused by an injury to the muscles and tissues in the back. The injury may result from: A muscle, tendon, or ligament getting overstretched or torn. Ligaments are tissues that connect bones to each other. Lifting something improperly can cause a back strain. Wear and tear (degeneration) of the spinal disks. Spinal disks are circular tissue that provide cushioning between the bones of the spine (vertebrae). Twisting motions, such as while playing sports or doing yard work. A hit to the back. Arthritis. You may have a physical exam, lab tests, and imaging tests to find the cause of your pain. Acute back pain usually goes away with rest and home care. Follow these instructions at home: Managing pain, stiffness, and swelling Take over-the-counter and prescription medicines only as told by your health care provider. Treatment may include medicines for pain and inflammation that are taken by mouth or applied to the skin, or muscle relaxants. Your health care provider may recommend applying ice during the first 24-48 hours after your pain starts. To do this: Put ice in a plastic bag. Place a towel between your skin and the bag. Leave the  ice on for 20 minutes, 2-3 times a day. Remove the ice if your skin turns bright red. This is very important. If you cannot feel pain, heat, or cold, you have a greater risk of damage to the area. If directed, apply heat to the affected area as often as told by your health care provider. Use the heat source that your health care provider recommends, such as a moist heat pack or a heating pad. Place a towel between your skin and the heat source. Leave the heat on for 20-30 minutes. Remove the heat if your skin turns bright red. This is especially important if you are unable to feel pain, heat, or cold. You have a greater risk of getting burned. Activity  Do not stay in bed. Staying in bed for more than 1-2 days can delay your recovery. Sit up and stand up straight. Avoid leaning forward when you sit or hunching over when you stand. If you work at a desk, sit close to it so you do not need to lean over. Keep your chin tucked in. Keep your neck drawn back, and keep your elbows bent at a 90-degree angle (right angle). Sit high and close to the steering wheel when you drive. Add lower back (lumbar) support to your car seat, if needed. Take short walks on even surfaces as soon as you are able. Try to increase the length of time you walk each day. Do not sit, drive, or stand in one  place for more than 30 minutes at a time. Sitting or standing for long periods of time can put stress on your back. Do not drive or use heavy machinery while taking prescription pain medicine. Use proper lifting techniques. When you bend and lift, use positions that put less stress on your back: [place] your knees. Keep the load close to your body. Avoid twisting. Exercise regularly as told by your health care provider. Exercising helps your back heal faster and helps prevent back injuries by keeping muscles strong and flexible. Work with a physical therapist to make a safe exercise program, as recommended by your health care  provider. Do any exercises as told by your physical therapist. Lifestyle Maintain a healthy weight. Extra weight puts stress on your back and makes it difficult to have good posture. Avoid activities or situations that make you feel anxious or stressed. Stress and anxiety increase muscle tension and can make back pain worse. Learn ways to manage anxiety and stress, such as through exercise. General instructions Sleep on a firm mattress in a comfortable position. Try lying on your side with your knees slightly bent. If you lie on your back, put a pillow under your knees. Keep your head and neck in a straight line with your spine (neutral position) when using electronic equipment like smartphones or pads. To do this: Raise your smartphone or pad to look at it instead of bending your head or neck to look down. Put the smartphone or pad at the level of your face while looking at the screen. Follow your treatment plan as told by your health care provider. This may include: Cognitive or behavioral therapy. Acupuncture or massage therapy. Meditation or yoga. Contact a health care provider if: You have pain that is not relieved with rest or medicine. You have increasing pain going down into your legs or buttocks. Your pain does not improve after 2 weeks. You have pain at night. You lose weight without trying. You have a fever or chills. You develop nausea or vomiting. You develop abdominal pain. Get help right away if: You develop new bowel or bladder control problems. You have unusual weakness or numbness in your arms or legs. You feel faint. These symptoms may represent a serious problem that is an emergency. Do not wait to see if the symptoms will go away. Get medical help right away. Call your local emergency services (911 in the [place] Do not drive yourself to the hospital. Summary Acute back pain is sudden and usually short-lived. Use proper lifting techniques. When you bend and lift, use  positions that put less stress on your back. Take over-the-counter and prescription medicines only as told by your health care provider, and apply heat or ice as told. This information is not intended to replace advice given to you by your health care provider. Make sure you discuss any questions you have with your health care provider. Document Revised: 01/05/2021 Document Reviewed: 01/05/2021 Elsevier Patient Education  2024 [employer].

## 2023-08-28 ENCOUNTER — Ambulatory Visit
Admission: RE | Admit: 2023-08-28 | Discharge: 2023-08-28 | Disposition: A | Discharge status: Home / Self Care | Payer: Medicare PPO | Source: Ambulatory Visit | Attending: Family Medicine | Admitting: Family Medicine

## 2023-08-28 DIAGNOSIS — D1803 Hemangioma of intra-abdominal structures: Secondary | ICD-10-CM | POA: Diagnosis not present

## 2023-08-28 DIAGNOSIS — K769 Liver disease, unspecified: Secondary | ICD-10-CM

## 2023-09-05 ENCOUNTER — Telehealth: Payer: Self-pay | Admitting: Family Medicine

## 2023-09-05 NOTE — Telephone Encounter (Signed)
Should patient present for another visit?

## 2023-09-05 NOTE — Telephone Encounter (Addendum)
Caller name: RUFFIN BERZINS  On DPR?: Yes  Call back number: [phone] (mobile)  Provider they see: Shade Flood, MD  Reason for call:  Pt stated that the prednisone prescribed helped and he felt some better but now the pain in back and side is back and he wants to know what he should do. Pt also stated that after finishing the 5 days, he started getting sick on his stomach, he threw up once and the a few times after that he felt nauseous.

## 2023-09-05 NOTE — Telephone Encounter (Signed)
Yes, I do think you should be seen again, especially if he had those issues with taking prednisone.  Please schedule visit within next week if possible.  Sooner if any return of nausea or vomiting.

## 2023-09-08 ENCOUNTER — Other Ambulatory Visit: Payer: Self-pay

## 2023-09-08 DIAGNOSIS — K769 Liver disease, unspecified: Secondary | ICD-10-CM

## 2023-09-08 NOTE — Telephone Encounter (Signed)
Pt has been scheduled with Dr Beverely Low

## 2023-09-09 ENCOUNTER — Encounter: Payer: Self-pay | Admitting: Physical Therapy

## 2023-09-09 ENCOUNTER — Ambulatory Visit: Payer: Medicare PPO | Admitting: Physical Therapy

## 2023-09-09 ENCOUNTER — Other Ambulatory Visit: Payer: Self-pay

## 2023-09-09 DIAGNOSIS — M6281 Muscle weakness (generalized): Secondary | ICD-10-CM | POA: Diagnosis not present

## 2023-09-09 DIAGNOSIS — R29898 Other symptoms and signs involving the musculoskeletal system: Secondary | ICD-10-CM | POA: Diagnosis not present

## 2023-09-09 DIAGNOSIS — R262 Difficulty in walking, not elsewhere classified: Secondary | ICD-10-CM | POA: Diagnosis not present

## 2023-09-09 DIAGNOSIS — M5459 Other low back pain: Secondary | ICD-10-CM | POA: Diagnosis not present

## 2023-09-09 DIAGNOSIS — M546 Pain in thoracic spine: Secondary | ICD-10-CM

## 2023-09-09 NOTE — Therapy (Signed)
OUTPATIENT PHYSICAL THERAPY THORACOLUMBAR EVALUATION   Patient Name: Peter Taylor MRN: [id number] DOB:09-Sep-1951, 72 y.o., male Today's Date: 09/09/2023  END OF SESSION:  PT End of Session - 09/09/23 1024     Visit Number 1    Number of Visits 17    Date for PT Re-Evaluation 11/04/23    Authorization Type [employer] MCR    Authorization Time Period 09/09/23 to 11/04/23    Progress Note Due on Visit 10    PT Start Time 0935    PT Stop Time 1009   time on MHP not included in billing   PT Time Calculation (min) 34 min    Activity Tolerance Patient tolerated treatment well;Patient limited by pain    Behavior During Therapy [facility] for tasks assessed/performed             Past Medical History:  Diagnosis Date   CAD (coronary artery disease), native coronary artery    a. Cath 12/10/19: 90% mLAD s/p DES, mild non obstructive disease in RCA and Cx   GERD (gastroesophageal reflux disease)    Hyperlipidemia    Substance abuse (HCC)    Tuberculosis    Past Surgical History:  Procedure Laterality Date   CORONARY STENT INTERVENTION N/A 12/10/2019   Procedure: CORONARY STENT INTERVENTION;  Surgeon: Corky Crafts, MD;  Location: [facility] INVASIVE CV LAB;  Service: Cardiovascular;  Laterality: N/A;   LEFT HEART CATH AND CORONARY ANGIOGRAPHY N/A 12/10/2019   Procedure: LEFT HEART CATH AND CORONARY ANGIOGRAPHY;  Surgeon: Corky Crafts, MD;  Location: [facility] INVASIVE CV LAB;  Service: Cardiovascular;  Laterality: N/A;   NO PAST SURGERIES     Patient Active Problem List   Diagnosis Date Noted   Gastroesophageal reflux disease without esophagitis 01/16/2023   Type 2 diabetes mellitus without complication, without long-term current use of insulin (HCC) 01/16/2023   Thyroid nodule 01/16/2023   Elevated PSA 01/16/2023   Benign prostatic hyperplasia 01/16/2023   Coronary artery disease    Hyperlipidemia 11/11/2019    PCP: Shade Flood, MD  REFERRING PROVIDER: Shade Flood,  MD  REFERRING DIAG: Diagnosis M54.6 (ICD-10-CM) - Acute right-sided thoracic back pain M54.50 (ICD-10-CM) - Acute right-sided low back pain without sciatica  Rationale for Evaluation and Treatment: Rehabilitation  THERAPY DIAG:  Other low back pain  Pain in thoracic spine  Muscle weakness (generalized)  Difficulty in walking, not elsewhere classified  Other symptoms and signs involving the musculoskeletal system  ONSET DATE: October 2024  SUBJECTIVE:  SUBJECTIVE STATEMENT:  I was helping someone unlock a machine at work, was pulling on a stuck pin and my right leg slipped out from under me and I hurt my back. Doctor gave me steroids and its been helping. When I stopped the steroids the pain came right back, I see the MD tomorrow. Have numbness and tingling going into L UE since the accident, also have been stumbling more since the accident. Hit right side when I fell. Tried going back to work Sunday but I had to stop 4 times, took me 9 hours to do a 2 hour run at work.   PERTINENT HISTORY:  CAD, HLD, substance abuse, hx TB  PAIN:  Are you having pain? Yes: NPRS scale: 9.75/10 Pain location: was sitting in middle of shoulder blade, moved down to R lower back  Pain description: shock wave, sharp pains, throb, pure pain, sitting and nagging  Aggravating factors: laying down Relieving factors: heat/hot water  PRECAUTIONS: None  RED FLAGS: None   WEIGHT BEARING RESTRICTIONS: No  FALLS:  Has patient fallen in last 6 months? Yes. Number of falls 1 fall at work that caused the pain , (+) FOF after accident   LIVING ENVIRONMENT: Lives with: lives with their family Lives in: House/apartment Stairs: none  Has following equipment at home: None  OCCUPATION: truck driver   PLOF: Independent,  Independent with basic ADLs, Independent with gait, and Independent with transfers  PATIENT GOALS: address pain, be able to work without flaring pain  NEXT MD VISIT: Referring MD tomorrow   OBJECTIVE:  Note: Objective measures were completed at Evaluation unless otherwise noted.    PATIENT SURVEYS:  FOTO 54, predicted 70 in 10 visits   SCREENING FOR RED FLAGS: Bowel or bladder incontinence: No Spinal tumors: No Cauda equina syndrome: No Compression fracture: No Abdominal aneurysm: No  COGNITION: Overall cognitive status: Within functional limits for tasks assessed     SENSATION: Reports some numbness going into LUE since the accident   MUSCLE LENGTH:  Quads and hip flexors severe limitation Hamstrings severe limitation per functional observation  POSTURE: rounded shoulders, forward head, flexed trunk , and tends to sacral sit, opts to sit with R side leaning as this helps pain/offshifts from L side   PALPATION: Thoracic paraspinals TTP and tight, lumbar spine tight   LUMBAR ROM:   AROM eval  Flexion Severe limitation; RFIS increased pain   Extension Severe limitation, REIS increased pain   Right lateral flexion Moderate limitation   Left lateral flexion Moderate limitation   Right rotation Moderate limitation   Left rotation Moderate limitation   (Blank rows = not tested)  Thoracic AROM: flexion moderate limitation, extension moderate limitation, lateral flexion mild limitation, rotation moderate limitation B    LOWER EXTREMITY MMT:    MMT Right eval Left eval  Hip flexion 4+ 4+  Hip extension    Hip abduction 3 3  Hip adduction    Hip internal rotation    Hip external rotation    Knee flexion 4+ 4  Knee extension 4+ 4  Ankle dorsiflexion 5 5  Ankle plantarflexion    Ankle inversion    Ankle eversion     (Blank rows = not tested)    GAIT: Distance walked: in clinic distances  Assistive device utilized: None Level of assistance: Complete  Independence Comments: antalgic, flexed at hips   TODAY'S TREATMENT:  DATE:   09/09/23 Evaluation, care planning/discussion of exam findings, HEP   MHP  to upper lumbar spine/lower thoracic in chair x5 minutes for pain relief (not included in billing) Seated QL stretch 2x15 seconds B Standing L stretch 2x15 seconds at counter  Thoracic and lumbar extension with pec stretch from chair 5x3 seconds            PATIENT EDUCATION:  Education details: exam findings, POC, HEP  Person educated: Patient Education method: Programmer, multimedia, Demonstration, and Handouts Education comprehension: verbalized understanding, returned demonstration, and needs further education  HOME EXERCISE PROGRAM: Access Code: [email or web address] URL: [email or web address] Date: 09/09/2023 Prepared by: Nedra Hai  Exercises - Seated Quadratus Lumborum Stretch in Chair  - 2 x daily - 7 x weekly - 1 sets - 3 reps - 15-30 seconds  hold - Standing 'L' Stretch at Counter  - 2 x daily - 7 x weekly - 1 sets - 3-5 reps - 15 seconds  hold - Seated Thoracic Lumbar Extension with Pectoralis Stretch  - 2 x daily - 7 x weekly - 1 sets - 10 reps - 3 seconds  hold  ASSESSMENT:  CLINICAL IMPRESSION: Patient is a 72 y.o. M who was seen today for physical therapy evaluation and treatment for Diagnosis M54.6 (ICD-10-CM) - Acute right-sided thoracic back pain M54.50 (ICD-10-CM) - Acute right-sided low back pain without sciatica. Objective findings as above, will benefit from skilled PT services to address pain and optimize overall level of function moving forward.    OBJECTIVE IMPAIRMENTS: Abnormal gait, decreased activity tolerance, decreased balance, decreased mobility, difficulty walking, decreased ROM, decreased strength, hypomobility, increased fascial restrictions, increased muscle spasms, impaired  flexibility, postural dysfunction, and pain.   ACTIVITY LIMITATIONS: carrying, lifting, bending, sitting, standing, sleeping, bed mobility, reach over head, hygiene/grooming, and locomotion level  PARTICIPATION LIMITATIONS: driving, shopping, community activity, occupation, and yard work  PERSONAL FACTORS: Age, Behavior pattern, Education, Fitness, Past/current experiences, Profession, Sex, Social background, and Time since onset of injury/illness/exacerbation are also affecting patient's functional outcome.   REHAB POTENTIAL: Good  CLINICAL DECISION MAKING: Stable/uncomplicated  EVALUATION COMPLEXITY: Low   GOALS: Goals reviewed with patient? Yes  SHORT TERM GOALS: Target date: 10/07/2023    Will be compliant with appropriate progressive HEP  Baseline: Goal status: INITIAL  2.  Will demonstrate improved functional posture without lateral shift, use of postural aides PRN/as desired  Baseline:  Goal status: INITIAL  3.  Lumbar, thoracic, and BLE mobility and flexibility to have improved by 50%  Baseline:  Goal status: INITIAL  4.  Pain to be no more than 7/10 at worst  Baseline:  Goal status: INITIAL    LONG TERM GOALS: Target date: 11/04/2023    MMT to improve by at least one grade in all weak groups  Baseline:  Goal status: INITIAL  2.  Pain to be no more than 4/10 at worst  Baseline:  Goal status: INITIAL  3.  Will have returned to work without significant increase in/flare up of pain levels from resting  Baseline:  Goal status: INITIAL  4.  Will demonstrate good functional biomechanics with all bed mobility and floor to waist lifting to prevent re-exacerbation of pain  Baseline:  Goal status: INITIAL  5.  FOTO score to be at or greater than predicted value at time of DC  Baseline:  Goal status: INITIAL    PLAN:  PT FREQUENCY: 2x/week  PT DURATION: 8 weeks  PLANNED INTERVENTIONS: 97164- PT Re-evaluation, 97110-Therapeutic exercises, 97530-  Therapeutic activity,  78295- Neuromuscular re-education, [id number]- Self Care, 62130- Manual therapy, [id number]- Gait training, [id number]- Electrical stimulation (unattended), [id number]- Traction (mechanical), and Dry Needling.  PLAN FOR NEXT SESSION: progress lumbar/thoracic/LE flexibility and mobility as tolerated, modalities PRN, strengthening as able. Work on correcting lateral shift if still present   Nedra Hai, PT, DPT 09/09/23 10:32 AM   Referring diagnosis? Diagnosis M54.6 (ICD-10-CM) - Acute right-sided thoracic back pain M54.50 (ICD-10-CM) - Acute right-sided low back pain without sciatica Treatment diagnosis? (if different than referring diagnosis) M54.59, M54.6, M26.81, R26.2, R29.898 What was this (referring dx) caused by? []  Surgery [x]  Fall []  Ongoing issue []  Arthritis []  Other: ____________  Laterality: []  Rt []  Lt [x]  Both  Check all possible CPT codes:  *CHOOSE 10 OR LESS*    See Planned Interventions listed in the Plan section of the Evaluation.

## 2023-09-10 ENCOUNTER — Encounter: Payer: Self-pay | Admitting: Family Medicine

## 2023-09-10 ENCOUNTER — Ambulatory Visit (INDEPENDENT_AMBULATORY_CARE_PROVIDER_SITE_OTHER): Payer: Medicare PPO | Admitting: Family Medicine

## 2023-09-10 VITALS — BP 114/78 | HR 78 | Temp 97.8°F | Ht 71.0 in | Wt 179.5 lb

## 2023-09-10 DIAGNOSIS — M546 Pain in thoracic spine: Secondary | ICD-10-CM

## 2023-09-10 DIAGNOSIS — M545 Low back pain, unspecified: Secondary | ICD-10-CM | POA: Diagnosis not present

## 2023-09-10 MED ORDER — TIZANIDINE HCL 4 MG PO TABS: 4.0000 mg | ORAL_TABLET | Freq: Three times a day (TID) | ORAL | 0 refills | Status: DC | PRN

## 2023-09-10 MED ORDER — HYDROCODONE-ACETAMINOPHEN 5-325 MG PO TABS: 1.0000 | ORAL_TABLET | Freq: Four times a day (QID) | ORAL | 0 refills | Status: DC | PRN

## 2023-09-10 NOTE — Patient Instructions (Signed)
Follow up as needed USE the Hydrocodone as needed for severe pain TAKE Ibuprofen for mild to moderate pain- take w/ food USE the Tizanidine (muscle relaxer) as needed for spasm HEAT your back for pain relief We'll call you to schedule your orthopedic appt Call with any questions or concerns Hang in there!

## 2023-09-10 NOTE — Progress Notes (Signed)
   Subjective:    Patient ID: Peter Taylor, male    DOB: September 26, 1951, 72 y.o.   MRN: [id number]  HPI Back pain- pt saw Dr Neva Seat on 10/30 for similar sxs and by the ER on 10/21.  Pt reports he fell at work, landing on his R side.  Was initially tx'd Naproxen and Methocarbamol but this didn't help.  Was then given Prednisone but this caused vomiting.  Pain radiates up between shoulder blades.  Pain radiates into buttocks.  Developed L sided back pain yesterday after doing therapy exercises (this was his first session).  Not having bowel or bladder incontinence.  Pt reports legs feel weak initially upon standing.  Pain is no better or worse.  Having difficulty sleeping due to pain.   Review of Systems For ROS see HPI     Objective:   Physical Exam Vitals reviewed.  Constitutional:      General: He is not in acute distress.    Appearance: Normal appearance. He is not ill-appearing.  Musculoskeletal:        General: Tenderness (TTP over R paraspinal muscles.) present. No deformity.  Skin:    General: Skin is warm and dry.  Neurological:     General: No focal deficit present.     Mental Status: He is alert.     Cranial Nerves: No cranial nerve deficit.     Motor: No weakness.     Gait: Gait normal.     Comments: (-) SLR bilaterally           Assessment & Plan:  R sided back pain- new to provider, ongoing for pt since October when he fell at work.  Pain is both lumbar and thoracic.  He reports no relief w/ Naproxen and Methocarbamol.  Prednisone made him sick.  He has only had 1 session w/ PT- encouraged him to continue.  Start Hydrocodone as needed for pain and use Tizanidine at night.  Refer to Ortho for complete evaluation.  Pt expressed understanding and is in agreement w/ plan.

## 2023-09-11 ENCOUNTER — Encounter: Payer: Medicare PPO | Admitting: Physical Therapy

## 2023-09-11 ENCOUNTER — Telehealth: Payer: Self-pay | Admitting: Physical Therapy

## 2023-09-11 NOTE — Telephone Encounter (Signed)
No-show for today's visit. Called and spoke to him, he thought the appt was at 11:45. Reminded him of time/date of next visit.    Nedra Hai, PT, DPT 09/11/23 9:07 AM

## 2023-09-12 ENCOUNTER — Encounter: Payer: Self-pay | Admitting: Physician Assistant

## 2023-09-12 ENCOUNTER — Ambulatory Visit: Payer: Medicare PPO | Admitting: Physician Assistant

## 2023-09-12 ENCOUNTER — Other Ambulatory Visit: Payer: Self-pay

## 2023-09-12 DIAGNOSIS — M546 Pain in thoracic spine: Secondary | ICD-10-CM | POA: Diagnosis not present

## 2023-09-12 NOTE — Progress Notes (Signed)
Office Visit Note   Patient: Peter Taylor           Date of Birth: July 24, 1951           MRN: [id number] Visit Date: 09/12/2023              Requested by: Shade Flood, MD [address] [place],  [place] [address] PCP: Shade Flood, MD   Assessment & Plan: Visit Diagnoses:  1. Pain in thoracic spine     Plan: Pleasant 72 year old gentleman with a 1 month history of right sided thoracic back pain.  This occurred when he fell down onto his right side with his arm tucked beneath him.  He has been followed and went to the ER but is also been followed by primary care.  Denies any shortness of breath.  He has started physical therapy.  He is taking Naprosyn has had a Toradol injection.  Continues to have difficulty.  Exam today did not show any focal tenderness to palpation anywhere.  Pain I believe is more muscular as it is more affected by turning certain ways and getting up and down from a chair.  He does not have any shortness of breath.  I do think physical therapy will be his best option.  Can follow-up with me as needed  Follow-Up Instructions: No follow-ups on file.   Orders:  Orders Placed This Encounter  Procedures   XR Thoracic Spine 2 View   No orders of the defined types were placed in this encounter.     Procedures: No procedures performed   Clinical Data: No additional findings.   Subjective: Chief Complaint  Patient presents with   Middle Back - Pain    HPI patient is a pleasant 72 year old gentleman with 1 month history of mid and right sided thoracic pain.  This was after a fall with his arm tucked beneath him.  Denies any shortness of breath and was fully evaluated after this fall.  He still continues to have thoracic pain which is more muscular as it is more affected when he turns or twists a certain way.  Does not have any tenderness to palpation that he can reproduce  Review of Systems  All other systems reviewed and are  negative.    Objective: Vital Signs: There were no vitals taken for this visit.  Physical Exam Constitutional:      Appearance: Normal appearance.  Pulmonary:     Effort: Pulmonary effort is normal.  Skin:    General: Skin is warm and dry.  Neurological:     General: No focal deficit present.     Mental Status: He is alert and oriented to person, place, and time.  Psychiatric:        Mood and Affect: Mood normal.        Behavior: Behavior normal.     Ortho Exam Examination of his thoracic spine no bruising no erythema compartments soft compressible his lower legs his strength of his upper body is 5 out of 5 cannot appreciate any asymmetry.  No evidence of any pain to palpation throughout his thoracic spine lumbar spine and rib cage Specialty Comments:  No specialty comments available.  Imaging: No results found.   PMFS History: Patient Active Problem List   Diagnosis Date Noted   Gastroesophageal reflux disease without esophagitis 01/16/2023   Type 2 diabetes mellitus without complication, without long-term current use of insulin (HCC) 01/16/2023   Thyroid nodule 01/16/2023  Elevated PSA 01/16/2023   Benign prostatic hyperplasia 01/16/2023   Dysphagia 08/01/2021   Multiple thyroid nodules 08/01/2021   Coronary artery disease    Hyperlipidemia 11/11/2019   Past Medical History:  Diagnosis Date   CAD (coronary artery disease), native coronary artery    a. Cath 12/10/19: 90% mLAD s/p DES, mild non obstructive disease in RCA and Cx   GERD (gastroesophageal reflux disease)    Hyperlipidemia    Substance abuse (HCC)    Tuberculosis     Family History  Problem Relation Age of Onset   Cancer Mother        unsure type   Cancer Brother        pancreatic   Ovarian cancer Sister    Ovarian cancer Sister    Ovarian cancer Sister     Past Surgical History:  Procedure Laterality Date   CORONARY STENT INTERVENTION N/A 12/10/2019   Procedure: CORONARY STENT  INTERVENTION;  Surgeon: Corky Crafts, MD;  Location: [facility] INVASIVE CV LAB;  Service: Cardiovascular;  Laterality: N/A;   LEFT HEART CATH AND CORONARY ANGIOGRAPHY N/A 12/10/2019   Procedure: LEFT HEART CATH AND CORONARY ANGIOGRAPHY;  Surgeon: Corky Crafts, MD;  Location: [facility] INVASIVE CV LAB;  Service: Cardiovascular;  Laterality: N/A;   NO PAST SURGERIES     Social History   Occupational History   Occupation: truck driver  Tobacco Use   Smoking status: Former   Smokeless tobacco: Former  Building services engineer status: Never Used  Substance and Sexual Activity   Alcohol use: Yes    Comment: occ   Drug use: No   Sexual activity: Not Currently

## 2023-09-14 ENCOUNTER — Encounter: Payer: Self-pay | Admitting: Family Medicine

## 2023-09-16 ENCOUNTER — Ambulatory Visit: Payer: Medicare PPO | Admitting: Rehabilitative and Restorative Service Providers"

## 2023-09-16 ENCOUNTER — Encounter: Payer: Self-pay | Admitting: Rehabilitative and Restorative Service Providers"

## 2023-09-16 DIAGNOSIS — M6281 Muscle weakness (generalized): Secondary | ICD-10-CM

## 2023-09-16 DIAGNOSIS — R262 Difficulty in walking, not elsewhere classified: Secondary | ICD-10-CM

## 2023-09-16 DIAGNOSIS — M5459 Other low back pain: Secondary | ICD-10-CM

## 2023-09-16 DIAGNOSIS — M546 Pain in thoracic spine: Secondary | ICD-10-CM | POA: Diagnosis not present

## 2023-09-16 DIAGNOSIS — R29898 Other symptoms and signs involving the musculoskeletal system: Secondary | ICD-10-CM | POA: Diagnosis not present

## 2023-09-16 NOTE — Therapy (Signed)
OUTPATIENT PHYSICAL THERAPY TREATMENT   Patient Name: Peter Taylor MRN: [id number] DOB:14-Jul-1951, 72 y.o., male Today's Date: 09/16/2023  END OF SESSION:  PT End of Session - 09/16/23 1120     Visit Number 2    Number of Visits 17    Date for PT Re-Evaluation 11/04/23    Authorization Type [employer] MCR    Authorization Time Period 09/09/23 to 11/04/23    Authorization - Visit Number 2    Authorization - Number of Visits 12    Progress Note Due on Visit 10    PT Start Time 1133    PT Stop Time 1212    PT Time Calculation (min) 39 min    Activity Tolerance Patient tolerated treatment well    Behavior During Therapy WFL for tasks assessed/performed              Past Medical History:  Diagnosis Date   CAD (coronary artery disease), native coronary artery    a. Cath 12/10/19: 90% mLAD s/p DES, mild non obstructive disease in RCA and Cx   GERD (gastroesophageal reflux disease)    Hyperlipidemia    Substance abuse (HCC)    Tuberculosis    Past Surgical History:  Procedure Laterality Date   CORONARY STENT INTERVENTION N/A 12/10/2019   Procedure: CORONARY STENT INTERVENTION;  Surgeon: Corky Crafts, MD;  Location: [facility];  Service: Cardiovascular;  Laterality: N/A;   LEFT HEART CATH AND CORONARY ANGIOGRAPHY N/A 12/10/2019   Procedure: LEFT HEART CATH AND CORONARY ANGIOGRAPHY;  Surgeon: Corky Crafts, MD;  Location: [facility] IN[facility]ASIVE CV [facility];  Service: Cardiovascular;  Laterality: N/A;   NO PAST SURGERIES     Patient Active Problem List   Diagnosis Date Noted   Thoracic back pain 09/12/2023   Gastroesophageal reflux disease without esophagitis 01/16/2023   Type 2 diabetes mellitus without complication, without long-term current use of insulin (HCC) 01/16/2023   Thyroid nodule 01/16/2023   Elevated PSA 01/16/2023   Benign prostatic hyperplasia 01/16/2023   Dysphagia 08/01/2021   Multiple thyroid nodules 08/01/2021   Coronary artery disease     Hyperlipidemia 11/11/2019    PCP: Shade Flood, MD  REFERRING PROVIDER: Shade Flood, MD  REFERRING DIAG: Diagnosis M54.6 (ICD-10-CM) - Acute right-sided thoracic back pain M54.50 (ICD-10-CM) - Acute right-sided low back pain without sciatica  Rationale for Evaluation and Treatment: Rehabilitation  THERAPY DIAG:  Other low back pain  Pain in thoracic spine  Muscle weakness (generalized)  Difficulty in walking, not elsewhere classified  Other symptoms and signs involving the musculoskeletal system  ONSET DATE: October 2024  SUBJECTIVE:  SUBJECTIVE STATEMENT: Pt reported about the same since first visit.  Pt indicated sitting/lying down troublesome.  Reported complaints in lower back and Lt side.  Comes and goes.    PERTINENT HISTORY:  CAD, HLD, substance abuse, hx TB  PAIN:  NPRS scale:  at worst in last 24 hours.  9/10 Pain location: was sitting in middle of shoulder blade, moved down to R lower back  Pain description: shock wave, sharp pains, throb, pure pain, sitting and nagging  Aggravating factors: sitting/lying down  Relieving factors: heat/hot water  PRECAUTIONS: None  RED FLAGS: None   WEIGHT BEARING RESTRICTIONS: No  FALLS:  Has patient fallen in last 6 months? Yes. Number of falls 1 fall at work that caused the pain , (+) FOF after accident   LIVING ENVIRONMENT: Lives with: lives with their family Lives in: House/apartment Stairs: none  Has following equipment at home: None  OCCUPATION: truck driver   PLOF: Independent, Independent with basic ADLs, Independent with gait, and Independent with transfers  PATIENT GOALS: address pain, be able to work without flaring pain  NEXT MD VISIT: Referring MD tomorrow   OBJECTIVE:  Note: Objective measures were  completed at Evaluation unless otherwise noted.  PATIENT SURVEYS:  09/09/2023 FOTO 54, predicted 70 in 10 visits   SCREENING FOR RED FLAGS: 09/09/2023 Bowel or bladder incontinence: No Spinal tumors: No Cauda equina syndrome: No Compression fracture: No Abdominal aneurysm: No  COGNITION: 09/09/2023 Overall cognitive status: Within functional limits for tasks assessed     SENSATION: 09/09/2023 Reports some numbness going into LUE since the accident   MUSCLE LENGTH: 09/09/2023 Quads and hip flexors severe limitation Hamstrings severe limitation per functional observation  POSTURE:  09/09/2023 rounded shoulders, forward head, flexed trunk , and tends to sacral sit, opts to sit with R side leaning as this helps pain/offshifts from L side   PALPATION: 09/09/2023 Thoracic paraspinals TTP and tight, lumbar spine tight   LUMBAR ROM:   AROM 09/09/2023 09/16/2023  Flexion Severe limitation; RFIS increased pain  Movement to mid thigh with back pain increased   Extension Severe limitation, REIS increased pain  < 25 % end range back pain  Repeated x5 in standing: improved to 75% WFL with end range symptoms noted  Right lateral flexion Moderate limitation    Left lateral flexion Moderate limitation    Right rotation Moderate limitation    Left rotation Moderate limitation    (Blank rows = not tested)  Thoracic AROM: flexion moderate limitation, extension moderate limitation, lateral flexion mild limitation, rotation moderate limitation B    LOWER EXTREMITY MMT:    MMT Right 09/09/2023 Left 09/09/2023  Hip flexion 4+ 4+  Hip extension    Hip abduction 3 3  Hip adduction    Hip internal rotation    Hip external rotation    Knee flexion 4+ 4  Knee extension 4+ 4  Ankle dorsiflexion 5 5  Ankle plantarflexion    Ankle inversion    Ankle eversion     (Blank rows = not tested)    GAIT: 09/09/2023 Distance walked: in clinic distances  Assistive device utilized:  None Level of assistance: Complete Independence Comments: antalgic, flexed at hips                    TODAY'S TREATMENT:  DATE: 09/16/2023 Therex Standing lumbar extension AROM x 5 (cues for home use) Supine lumbar trunk rotation stretch to tolerance 15 seconds x 3 bilateral Nustep Lvl 6 10 mins UE/LE Green band rows 2 x 15 bilaterally  Green band gh ext  2 x 15 bilaterallly  Review of existing HEP and update to HEP with handout given.    Neuro Re-ed Pain neuroscience education given regarding nervous system elevation/sensitivity increases and how that can impact sensation and response to activity. Detailed importance of graded exercise (HEP, aerobic exercise options) and mobility during the day to promote reduced sensitivity.  Question and answer time given for improved understanding.    TODAY'S TREATMENT:                                              DATE: 09/09/23  Evaluation, care planning/discussion of exam findings, HEP  MHP  to upper lumbar spine/lower thoracic in chair x5 minutes for pain relief (not included in billing) Seated QL stretch 2x15 seconds B Standing L stretch 2x15 seconds at counter  Thoracic and lumbar extension with pec stretch from chair 5x3 seconds      PATIENT EDUCATION:  09/16/2023 Education details: HEP update, pain neuro science Person educated: Patient Education method: Explanation, Demonstration, and Handouts Education comprehension: verbalized understanding, returned demonstration, and needs further education  HOME EXERCISE PROGRAM: Access Code: [email or web address] URL: [email or web address] Date: 09/16/2023 Prepared by: Chyrel Masson  Exercises - Supine Lower Trunk Rotation  - 2-3 x daily - 7 x weekly - 1 sets - 3-5 reps - 15 hold - Standing Lumbar Extension with Counter  - 3-5 x daily - 7 x weekly - 1 sets - 5-10 reps - Hooklying Single Knee to Chest Stretch  - 2-3 x daily - 7 x weekly - 1  sets - 5 reps - 15 hold - Seated Quadratus Lumborum Stretch in Chair  - 2 x daily - 7 x weekly - 1 sets - 3 reps - 15-30 seconds  hold  ASSESSMENT:  CLINICAL IMPRESSION: Gross lumbar mobility restriction with pain symptoms noted in motion.  Re assesment of movement today showed improvement in mobility tolerance with mobility intervention.  Adjusted HEP to reflect check within clinic.   OBJECTIVE IMPAIRMENTS: Abnormal gait, decreased activity tolerance, decreased balance, decreased mobility, difficulty walking, decreased ROM, decreased strength, hypomobility, increased fascial restrictions, increased muscle spasms, impaired flexibility, postural dysfunction, and pain.   ACTIVITY LIMITATIONS: carrying, lifting, bending, sitting, standing, sleeping, bed mobility, reach over head, hygiene/grooming, and locomotion level  PARTICIPATION LIMITATIONS: driving, shopping, community activity, occupation, and yard work  PERSONAL FACTORS: Age, Behavior pattern, Education, Fitness, Past/current experiences, Profession, Sex, Social background, and Time since onset of injury/illness/exacerbation are also affecting patient's functional outcome.   REHAB POTENTIAL: Good  CLINICAL DECISION MAKING: Stable/uncomplicated  EVALUATION COMPLEXITY: Low   GOALS: Goals reviewed with patient? Yes  SHORT TERM GOALS: Target date: 10/07/2023    Will be compliant with appropriate progressive HEP  Baseline: Goal status: on going 09/16/2023  2.  Will demonstrate improved functional posture without lateral shift, use of postural aides PRN/as desired  Baseline:  Goal status: on going 09/16/2023  3.  Lumbar, thoracic, and BLE mobility and flexibility to have improved by 50%  Baseline:  Goal status: on going 09/16/2023  4.  Pain to be no more than 7/10 at worst  Baseline:  Goal status:  on going 09/16/2023    LONG TERM GOALS: Target date: 11/04/2023    MMT to improve by at least one grade in all weak groups   Baseline:  Goal status: INITIAL  2.  Pain to be no more than 4/10 at worst  Baseline:  Goal status: INITIAL  3.  Will have returned to work without significant increase in/flare up of pain levels from resting  Baseline:  Goal status: INITIAL  4.  Will demonstrate good functional biomechanics with all bed mobility and floor to waist lifting to prevent re-exacerbation of pain  Baseline:  Goal status: INITIAL  5.  FOTO score to be at or greater than predicted value at time of DC  Baseline:  Goal status: INITIAL    PLAN:  PT FREQUENCY: 2x/week  PT DURATION: 8 weeks  PLANNED INTERVENTIONS: 97164- PT Re-evaluation, 97110-Therapeutic exercises, 97530- Therapeutic activity, 97112- Neuromuscular re-education, 97535- Self Care, 56433- Manual therapy, [id number]- Gait training, 97014- Electrical stimulation (unattended), [id number]- Traction (mechanical), and Dry Needling.  PLAN FOR NEXT SESSION: General mobility gains and functional core strengthening for work return.   Chyrel Masson, PT, DPT, OCS, ATC 09/16/23  12:09 PM        Referring diagnosis? Diagnosis M54.6 (ICD-10-CM) - Acute right-sided thoracic back pain M54.50 (ICD-10-CM) - Acute right-sided low back pain without sciatica Treatment diagnosis? (if different than referring diagnosis) M54.59, M54.6, M26.81, R26.2, R29.898 What was this (referring dx) caused by? []  Surgery [x]  Fall []  Ongoing issue []  Arthritis []  Other: ____________  Laterality: []  Rt []  Lt [x]  Both  Check all possible CPT codes:  *CHOOSE 10 OR LESS*    See Planned Interventions listed in the Plan section of the Evaluation.

## 2023-09-19 ENCOUNTER — Ambulatory Visit: Payer: Medicare PPO | Admitting: Physical Therapy

## 2023-09-19 ENCOUNTER — Encounter: Payer: Self-pay | Admitting: Physical Therapy

## 2023-09-19 DIAGNOSIS — M546 Pain in thoracic spine: Secondary | ICD-10-CM | POA: Diagnosis not present

## 2023-09-19 DIAGNOSIS — R29898 Other symptoms and signs involving the musculoskeletal system: Secondary | ICD-10-CM

## 2023-09-19 DIAGNOSIS — M6281 Muscle weakness (generalized): Secondary | ICD-10-CM

## 2023-09-19 DIAGNOSIS — R262 Difficulty in walking, not elsewhere classified: Secondary | ICD-10-CM | POA: Diagnosis not present

## 2023-09-19 DIAGNOSIS — M5459 Other low back pain: Secondary | ICD-10-CM

## 2023-09-19 NOTE — Therapy (Signed)
OUTPATIENT PHYSICAL THERAPY TREATMENT   Patient Name: Peter Taylor MRN: [id number] DOB:24-Jan-1951, 72 y.o., male Today's Date: 09/19/2023  END OF SESSION:  PT End of Session - 09/19/23 1125     Visit Number 3    Number of Visits 17    Date for PT Re-Evaluation 11/04/23    Authorization Type [employer] MCR    Authorization Time Period 09/09/23 to 11/04/23    Authorization - Visit Number 3    Authorization - Number of Visits 12    Progress Note Due on Visit 10    PT Start Time 1126    PT Stop Time 1205    PT Time Calculation (min) 39 min    Activity Tolerance Patient tolerated treatment well    Behavior During Therapy WFL for tasks assessed/performed               Past Medical History:  Diagnosis Date   CAD (coronary artery disease), native coronary artery    a. Cath 12/10/19: 90% mLAD s/p DES, mild non obstructive disease in RCA and Cx   GERD (gastroesophageal reflux disease)    Hyperlipidemia    Substance abuse (HCC)    Tuberculosis    Past Surgical History:  Procedure Laterality Date   CORONARY STENT INTERVENTION N/A 12/10/2019   Procedure: CORONARY STENT INTERVENTION;  Surgeon: Corky Crafts, MD;  Location: [facility];  Service: Cardiovascular;  Laterality: N/A;   LEFT HEART CATH AND CORONARY ANGIOGRAPHY N/A 12/10/2019   Procedure: LEFT HEART CATH AND CORONARY ANGIOGRAPHY;  Surgeon: Corky Crafts, MD;  Location: [facility] INVASIVE CV [facility];  Service: Cardiovascular;  Laterality: N/A;   NO PAST SURGERIES     Patient Active Problem List   Diagnosis Date Noted   Thoracic back pain 09/12/2023   Gastroesophageal reflux disease without esophagitis 01/16/2023   Type 2 diabetes mellitus without complication, without long-term current use of insulin (HCC) 01/16/2023   Thyroid nodule 01/16/2023   Elevated PSA 01/16/2023   Benign prostatic hyperplasia 01/16/2023   Dysphagia 08/01/2021   Multiple thyroid nodules 08/01/2021   Coronary artery disease     Hyperlipidemia 11/11/2019    PCP: Shade Flood, MD  REFERRING PROVIDER: Shade Flood, MD  REFERRING DIAG: Diagnosis M54.6 (ICD-10-CM) - Acute right-sided thoracic back pain M54.50 (ICD-10-CM) - Acute right-sided low back pain without sciatica  Rationale for Evaluation and Treatment: Rehabilitation  THERAPY DIAG:  Other low back pain  Pain in thoracic spine  Muscle weakness (generalized)  Difficulty in walking, not elsewhere classified  Other symptoms and signs involving the musculoskeletal system  ONSET DATE: October 2024  SUBJECTIVE:  SUBJECTIVE STATEMENT:  Feeling better, sleeping is getting easier. Might wake up at 1 or 2 in the morning feels like needles are sticking in my back and I have to get in hot shower and take a pill.    PERTINENT HISTORY:  CAD, HLD, substance abuse, hx TB  PAIN:  NPRS scale:  7/10 now  Pain location:  L lower back Pain description: nagging pain/uncomfortable  Aggravating factors: gets worse at night   Relieving factors: heat/hot water and medicine   PRECAUTIONS: None  RED FLAGS: None   WEIGHT BEARING RESTRICTIONS: No  FALLS:  Has patient fallen in last 6 months? Yes. Number of falls 1 fall at work that caused the pain , (+) FOF after accident   LIVING ENVIRONMENT: Lives with: lives with their family Lives in: House/apartment Stairs: none  Has following equipment at home: None  OCCUPATION: truck driver   PLOF: Independent, Independent with basic ADLs, Independent with gait, and Independent with transfers  PATIENT GOALS: address pain, be able to work without flaring pain  NEXT MD VISIT: Referring MD tomorrow   OBJECTIVE:  Note: Objective measures were completed at Evaluation unless otherwise noted.  PATIENT SURVEYS:   09/09/2023 FOTO 54, predicted 70 in 10 visits   SCREENING FOR RED FLAGS: 09/09/2023 Bowel or bladder incontinence: No Spinal tumors: No Cauda equina syndrome: No Compression fracture: No Abdominal aneurysm: No  COGNITION: 09/09/2023 Overall cognitive status: Within functional limits for tasks assessed     SENSATION: 09/09/2023 Reports some numbness going into LUE since the accident   MUSCLE LENGTH: 09/09/2023 Quads and hip flexors severe limitation Hamstrings severe limitation per functional observation  POSTURE:  09/09/2023 rounded shoulders, forward head, flexed trunk , and tends to sacral sit, opts to sit with R side leaning as this helps pain/offshifts from L side   PALPATION: 09/09/2023 Thoracic paraspinals TTP and tight, lumbar spine tight   LUMBAR ROM:   AROM 09/09/2023 09/16/2023  Flexion Severe limitation; RFIS increased pain  Movement to mid thigh with back pain increased   Extension Severe limitation, REIS increased pain  < 25 % end range back pain  Repeated x5 in standing: improved to 75% WFL with end range symptoms noted  Right lateral flexion Moderate limitation    Left lateral flexion Moderate limitation    Right rotation Moderate limitation    Left rotation Moderate limitation    (Blank rows = not tested)  Thoracic AROM: flexion moderate limitation, extension moderate limitation, lateral flexion mild limitation, rotation moderate limitation B    LOWER EXTREMITY MMT:    MMT Right 09/09/2023 Left 09/09/2023  Hip flexion 4+ 4+  Hip extension    Hip abduction 3 3  Hip adduction    Hip internal rotation    Hip external rotation    Knee flexion 4+ 4  Knee extension 4+ 4  Ankle dorsiflexion 5 5  Ankle plantarflexion    Ankle inversion    Ankle eversion     (Blank rows = not tested)    GAIT: 09/09/2023 Distance walked: in clinic distances  Assistive device utilized: None Level of assistance: Complete Independence Comments: antalgic,  flexed at hips                    TODAY'S TREATMENT:  DATE:   09/19/23  TherEx  Nustep L6 x10 minutes BLEs only Lumbar rotation stretch 5x5 seconds B PPT 15x3 second holds Figure 4 piriformis stretch 2x30 seconds B  HS stretches 1x30 seconds B- some lumbar sx with R HS stretch, limited reps  Bridges 2x12  PPT + march x12 B  Hip hikes x10 B standing  L stretch- attempted but caused pain Standing lumbar extensions x12  Lumbar extension prone to elbows x10        09/16/2023  Therex Standing lumbar extension AROM x 5 (cues for home use) Supine lumbar trunk rotation stretch to tolerance 15 seconds x 3 bilateral Nustep Lvl 6 10 mins UE/LE Green band rows 2 x 15 bilaterally  Green band gh ext  2 x 15 bilaterallly  Review of existing HEP and update to HEP with handout given.    Neuro Re-ed Pain neuroscience education given regarding nervous system elevation/sensitivity increases and how that can impact sensation and response to activity. Detailed importance of graded exercise (HEP, aerobic exercise options) and mobility during the day to promote reduced sensitivity.  Question and answer time given for improved understanding.    TODAY'S TREATMENT:                                              DATE: 09/09/23  Evaluation, care planning/discussion of exam findings, HEP  MHP  to upper lumbar spine/lower thoracic in chair x5 minutes for pain relief (not included in billing) Seated QL stretch 2x15 seconds B Standing L stretch 2x15 seconds at counter  Thoracic and lumbar extension with pec stretch from chair 5x3 seconds      PATIENT EDUCATION:  09/16/2023 Education details: HEP update, pain neuro science Person educated: Patient Education method: Explanation, Demonstration, and Handouts Education comprehension: verbalized understanding, returned demonstration, and needs further education  HOME EXERCISE PROGRAM: Access Code:  [email or web address] URL: [email or web address] Date: 09/16/2023 Prepared by: Chyrel Masson  Exercises - Supine Lower Trunk Rotation  - 2-3 x daily - 7 x weekly - 1 sets - 3-5 reps - 15 hold - Standing Lumbar Extension with Counter  - 3-5 x daily - 7 x weekly - 1 sets - 5-10 reps - Hooklying Single Knee to Chest Stretch  - 2-3 x daily - 7 x weekly - 1 sets - 5 reps - 15 hold - Seated Quadratus Lumborum Stretch in Chair  - 2 x daily - 7 x weekly - 1 sets - 3 reps - 15-30 seconds  hold  ASSESSMENT:  CLINICAL IMPRESSION:  Pt arrives today feeling a bit better, we continued working on progressing movement as tolerated with increased focus on core work this session. He was curious about DC date, educated that plan is still early and majority of back pain needs a few weeks with PT (8-10 visits) to really significantly improve. Will continue to progress as able.   OBJECTIVE IMPAIRMENTS: Abnormal gait, decreased activity tolerance, decreased balance, decreased mobility, difficulty walking, decreased ROM, decreased strength, hypomobility, increased fascial restrictions, increased muscle spasms, impaired flexibility, postural dysfunction, and pain.   ACTIVITY LIMITATIONS: carrying, lifting, bending, sitting, standing, sleeping, bed mobility, reach over head, hygiene/grooming, and locomotion level  PARTICIPATION LIMITATIONS: driving, shopping, community activity, occupation, and yard work  PERSONAL FACTORS: Age, Behavior pattern, Education, Fitness, Past/current experiences, Profession, Sex, Social background, and Time since onset of injury/illness/exacerbation are also affecting patient's functional  outcome.   REHAB POTENTIAL: Good  CLINICAL DECISION MAKING: Stable/uncomplicated  EVALUATION COMPLEXITY: Low   GOALS: Goals reviewed with patient? Yes  SHORT TERM GOALS: Target date: 10/07/2023    Will be compliant with appropriate progressive HEP  Baseline: Goal status: on going  09/16/2023  2.  Will demonstrate improved functional posture without lateral shift, use of postural aides PRN/as desired  Baseline:  Goal status: on going 09/16/2023  3.  Lumbar, thoracic, and BLE mobility and flexibility to have improved by 50%  Baseline:  Goal status: on going 09/16/2023  4.  Pain to be no more than 7/10 at worst  Baseline:  Goal status: on going 09/16/2023    LONG TERM GOALS: Target date: 11/04/2023    MMT to improve by at least one grade in all weak groups  Baseline:  Goal status: INITIAL  2.  Pain to be no more than 4/10 at worst  Baseline:  Goal status: INITIAL  3.  Will have returned to work without significant increase in/flare up of pain levels from resting  Baseline:  Goal status: INITIAL  4.  Will demonstrate good functional biomechanics with all bed mobility and floor to waist lifting to prevent re-exacerbation of pain  Baseline:  Goal status: INITIAL  5.  FOTO score to be at or greater than predicted value at time of DC  Baseline:  Goal status: INITIAL    PLAN:  PT FREQUENCY: 2x/week  PT DURATION: 8 weeks  PLANNED INTERVENTIONS: 97164- PT Re-evaluation, 97110-Therapeutic exercises, 97530- Therapeutic activity, 97112- Neuromuscular re-education, 97535- Self Care, 08657- Manual therapy, [id number]- Gait training, 97014- Electrical stimulation (unattended), [id number]- Traction (mechanical), and Dry Needling.  PLAN FOR NEXT SESSION: General mobility gains and functional core strengthening for work return. Biomechanics   Nedra Hai, [place], DPT 09/19/23 12:06 PM       Referring diagnosis? Diagnosis M54.6 (ICD-10-CM) - Acute right-sided thoracic back pain M54.50 (ICD-10-CM) - Acute right-sided low back pain without sciatica Treatment diagnosis? (if different than referring diagnosis) M54.59, M54.6, M26.81, R26.2, R29.898 What was this (referring dx) caused by? []  Surgery [x]  Fall []  Ongoing issue []  Arthritis []  Other:  ____________  Laterality: []  Rt []  Lt [x]  Both  Check all possible CPT codes:  *CHOOSE 10 OR LESS*    See Planned Interventions listed in the Plan section of the Evaluation.

## 2023-09-22 ENCOUNTER — Other Ambulatory Visit: Payer: Self-pay | Admitting: Family Medicine

## 2023-09-22 ENCOUNTER — Encounter: Payer: Self-pay | Admitting: Physical Therapy

## 2023-09-22 ENCOUNTER — Ambulatory Visit: Payer: Medicare PPO | Admitting: Physical Therapy

## 2023-09-22 DIAGNOSIS — M5459 Other low back pain: Secondary | ICD-10-CM | POA: Diagnosis not present

## 2023-09-22 DIAGNOSIS — M546 Pain in thoracic spine: Secondary | ICD-10-CM

## 2023-09-22 DIAGNOSIS — R262 Difficulty in walking, not elsewhere classified: Secondary | ICD-10-CM

## 2023-09-22 DIAGNOSIS — R29898 Other symptoms and signs involving the musculoskeletal system: Secondary | ICD-10-CM | POA: Diagnosis not present

## 2023-09-22 DIAGNOSIS — M6281 Muscle weakness (generalized): Secondary | ICD-10-CM | POA: Diagnosis not present

## 2023-09-22 MED ORDER — TIZANIDINE HCL 4 MG PO TABS: 4.0000 mg | ORAL_TABLET | Freq: Three times a day (TID) | ORAL | 0 refills | Status: AC | PRN

## 2023-09-22 MED ORDER — HYDROCODONE-ACETAMINOPHEN 5-325 MG PO TABS: 1.0000 | ORAL_TABLET | Freq: Four times a day (QID) | ORAL | 0 refills | Status: DC | PRN

## 2023-09-22 NOTE — Therapy (Signed)
OUTPATIENT PHYSICAL THERAPY TREATMENT   Patient Name: Peter Taylor MRN: [id number] DOB:[birth year], 72 y.o., male Today's Date: 09/22/2023  END OF SESSION:  PT End of Session - 09/22/23 1145     Visit Number 4    Number of Visits 17    Date for PT Re-Evaluation 11/04/23    Authorization Type [employer] MCR    Authorization Time Period 09/09/23 to 11/04/23    Authorization - Visit Number 4    Authorization - Number of Visits 12    Progress Note Due on Visit 10    PT Start Time 1140    PT Stop Time 1220    PT Time Calculation (min) 40 min    Activity Tolerance Patient tolerated treatment well    Behavior During Therapy WFL for tasks assessed/performed               Past Medical History:  Diagnosis Date   CAD (coronary artery disease), native coronary artery    a. Cath 12/10/19: 90% mLAD s/p DES, mild non obstructive disease in RCA and Cx   GERD (gastroesophageal reflux disease)    Hyperlipidemia    Substance abuse (HCC)    Tuberculosis    Past Surgical History:  Procedure Laterality Date   CORONARY STENT INTERVENTION N/A 12/10/2019   Procedure: CORONARY STENT INTERVENTION;  Surgeon: Corky Crafts, MD;  Location: [facility] INVASIVE [facility];  Service: Cardiovascular;  Laterality: N/A;   LEFT HEART CATH AND CORONARY ANGIOGRAPHY N/A 12/10/2019   Procedure: LEFT HEART CATH AND CORONARY ANGIOGRAPHY;  Surgeon: Corky Crafts, MD;  Location: [facility] INVASIVE CV LAB;  Service: Cardiovascular;  Laterality: N/A;   NO PAST SURGERIES     Patient Active Problem List   Diagnosis Date Noted   Thoracic back pain 09/12/2023   Gastroesophageal reflux disease without esophagitis 01/16/2023   Type 2 diabetes mellitus without complication, without long-term current use of insulin (HCC) 01/16/2023   Thyroid nodule 01/16/2023   Elevated PSA 01/16/2023   Benign prostatic hyperplasia 01/16/2023   Dysphagia 08/01/2021   Multiple thyroid nodules 08/01/2021   Coronary artery disease     Hyperlipidemia 11/11/2019    PCP: Shade Flood, MD  REFERRING PROVIDER: Shade Flood, MD  REFERRING DIAG: Diagnosis M54.6 (ICD-10-CM) - Acute right-sided thoracic back pain M54.50 (ICD-10-CM) - Acute right-sided low back pain without sciatica  Rationale for Evaluation and Treatment: Rehabilitation  THERAPY DIAG:  Other low back pain  Pain in thoracic spine  Muscle weakness (generalized)  Difficulty in walking, not elsewhere classified  Other symptoms and signs involving the musculoskeletal system  ONSET DATE: October 2024  SUBJECTIVE:  SUBJECTIVE STATEMENT: The meds help him. He relays 5/10 pain in left lumbar     PERTINENT HISTORY:  CAD, HLD, substance abuse, hx TB  PAIN:  NPRS scale:  5/10 now  Pain location:  L lower back Pain description: nagging pain/uncomfortable  Aggravating factors: gets worse at night   Relieving factors: heat/hot water and medicine   PRECAUTIONS: None  RED FLAGS: None   WEIGHT BEARING RESTRICTIONS: No  FALLS:  Has patient fallen in last 6 months? Yes. Number of falls 1 fall at work that caused the pain , (+) FOF after accident   LIVING ENVIRONMENT: Lives with: lives with their family Lives in: House/apartment Stairs: none  Has following equipment at home: None  OCCUPATION: truck driver   PLOF: Independent, Independent with basic ADLs, Independent with gait, and Independent with transfers  PATIENT GOALS: address pain, be able to work without flaring pain  NEXT MD VISIT: Referring MD tomorrow   OBJECTIVE:  Note: Objective measures were completed at Evaluation unless otherwise noted.  PATIENT SURVEYS:  09/09/2023 FOTO 54, predicted 70 in 10 visits   SCREENING FOR RED FLAGS: 09/09/2023 Bowel or bladder incontinence: No Spinal  tumors: No Cauda equina syndrome: No Compression fracture: No Abdominal aneurysm: No  COGNITION: 09/09/2023 Overall cognitive status: Within functional limits for tasks assessed     SENSATION: 09/09/2023 Reports some numbness going into LUE since the accident   MUSCLE LENGTH: 09/09/2023 Quads and hip flexors severe limitation Hamstrings severe limitation per functional observation  POSTURE:  09/09/2023 rounded shoulders, forward head, flexed trunk , and tends to sacral sit, opts to sit with R side leaning as this helps pain/offshifts from L side   PALPATION: 09/09/2023 Thoracic paraspinals TTP and tight, lumbar spine tight   LUMBAR ROM:   AROM 09/09/2023 09/16/2023  Flexion Severe limitation; RFIS increased pain  Movement to mid thigh with back pain increased   Extension Severe limitation, REIS increased pain  < 25 % end range back pain  Repeated x5 in standing: improved to 75% WFL with end range symptoms noted  Right lateral flexion Moderate limitation    Left lateral flexion Moderate limitation    Right rotation Moderate limitation    Left rotation Moderate limitation    (Blank rows = not tested)  Thoracic AROM: flexion moderate limitation, extension moderate limitation, lateral flexion mild limitation, rotation moderate limitation B    LOWER EXTREMITY MMT:    MMT Right 09/09/2023 Left 09/09/2023  Hip flexion 4+ 4+  Hip extension    Hip abduction 3 3  Hip adduction    Hip internal rotation    Hip external rotation    Knee flexion 4+ 4  Knee extension 4+ 4  Ankle dorsiflexion 5 5  Ankle plantarflexion    Ankle inversion    Ankle eversion     (Blank rows = not tested)    GAIT: 09/09/2023 Distance walked: in clinic distances  Assistive device utilized: None Level of assistance: Complete Independence Comments: antalgic, flexed at hips                    TODAY'S TREATMENT:                                              DATE:   09/22/23 TherEx Nustep L6 x10 minutes BLEs only Rows green 2X15 Shoulder extensions green 2X15 Hip  hikes x10 B standing  Standing lumbar extensions x12  Lumbar rotation stretch 10x5 seconds B PPT 15x5 second holds Figure 4 piriformis stretch 3x20 seconds B  Bridges 2x10  Lumbar extension prone to elbows x10   Manual therapy performed by Ivery Quale, PT,DPT for active compression and skilled palpation and Trigger Point Dry-Needling  Treatment instructions: Expect mild to moderate muscle soreness. Patient Consent Given: Yes Education handout provided: verbally provided Muscles treated: Left side Lumbar paraspinals, QL Estim combined: NO Treatment response/outcome: he was anxious about this at first and requests not to see the needle, patient was positioned prone and given cues to relax during procedure I only performed this at 2 spots with minimal pistoning to improve overall tolerance,twitch response noted and he does relay he feels better afterwards.     09/19/23  TherEx  Nustep L6 x10 minutes BLEs only Lumbar rotation stretch 5x5 seconds B PPT 15x3 second holds Figure 4 piriformis stretch 2x30 seconds B  HS stretches 1x30 seconds B- some lumbar sx with R HS stretch, limited reps  Bridges 2x12  PPT + march x12 B  Hip hikes x10 B standing  L stretch- attempted but caused pain Standing lumbar extensions x12  Lumbar extension prone to elbows x10        09/16/2023  Therex Standing lumbar extension AROM x 5 (cues for home use) Supine lumbar trunk rotation stretch to tolerance 15 seconds x 3 bilateral Nustep Lvl 6 10 mins UE/LE Green band rows 2 x 15 bilaterally  Green band gh ext  2 x 15 bilaterallly  Review of existing HEP and update to HEP with handout given.    Neuro Re-ed Pain neuroscience education given regarding nervous system elevation/sensitivity increases and how that can impact sensation and response to activity. Detailed importance of graded exercise  (HEP, aerobic exercise options) and mobility during the day to promote reduced sensitivity.  Question and answer time given for improved understanding.    TODAY'S TREATMENT:                                              DATE: 09/09/23  Evaluation, care planning/discussion of exam findings, HEP  MHP  to upper lumbar spine/lower thoracic in chair x5 minutes for pain relief (not included in billing) Seated QL stretch 2x15 seconds B Standing L stretch 2x15 seconds at counter  Thoracic and lumbar extension with pec stretch from chair 5x3 seconds      PATIENT EDUCATION:  09/16/2023 Education details: HEP update, pain neuro science Person educated: Patient Education method: Explanation, Demonstration, and Handouts Education comprehension: verbalized understanding, returned demonstration, and needs further education  HOME EXERCISE PROGRAM: Access Code: [email or web address] URL: [email or web address] Date: 09/16/2023 Prepared by: Chyrel Masson  Exercises - Supine Lower Trunk Rotation  - 2-3 x daily - 7 x weekly - 1 sets - 3-5 reps - 15 hold - Standing Lumbar Extension with Counter  - 3-5 x daily - 7 x weekly - 1 sets - 5-10 reps - Hooklying Single Knee to Chest Stretch  - 2-3 x daily - 7 x weekly - 1 sets - 5 reps - 15 hold - Seated Quadratus Lumborum Stretch in Chair  - 2 x daily - 7 x weekly - 1 sets - 3 reps - 15-30 seconds  hold  ASSESSMENT:  CLINICAL IMPRESSION: He does appear to be improving some but still with  some pain localized to left lumbar P.S and QL. I did try DN to this area with good response after so we will assess his longer term response to this next visit.   OBJECTIVE IMPAIRMENTS: Abnormal gait, decreased activity tolerance, decreased balance, decreased mobility, difficulty walking, decreased ROM, decreased strength, hypomobility, increased fascial restrictions, increased muscle spasms, impaired flexibility, postural dysfunction, and pain.   ACTIVITY LIMITATIONS:  carrying, lifting, bending, sitting, standing, sleeping, bed mobility, reach over head, hygiene/grooming, and locomotion level  PARTICIPATION LIMITATIONS: driving, shopping, community activity, occupation, and yard work  PERSONAL FACTORS: Age, Behavior pattern, Education, Fitness, Past/current experiences, Profession, Sex, Social background, and Time since onset of injury/illness/exacerbation are also affecting patient's functional outcome.   REHAB POTENTIAL: Good  CLINICAL DECISION MAKING: Stable/uncomplicated  EVALUATION COMPLEXITY: Low   GOALS: Goals reviewed with patient? Yes  SHORT TERM GOALS: Target date: 10/07/2023    Will be compliant with appropriate progressive HEP  Baseline: Goal status: on going 09/16/2023  2.  Will demonstrate improved functional posture without lateral shift, use of postural aides PRN/as desired  Baseline:  Goal status: on going 09/16/2023  3.  Lumbar, thoracic, and BLE mobility and flexibility to have improved by 50%  Baseline:  Goal status: on going 09/16/2023  4.  Pain to be no more than 7/10 at worst  Baseline:  Goal status: on going 09/16/2023    LONG TERM GOALS: Target date: 11/04/2023    MMT to improve by at least one grade in all weak groups  Baseline:  Goal status: INITIAL  2.  Pain to be no more than 4/10 at worst  Baseline:  Goal status: INITIAL  3.  Will have returned to work without significant increase in/flare up of pain levels from resting  Baseline:  Goal status: INITIAL  4.  Will demonstrate good functional biomechanics with all bed mobility and floor to waist lifting to prevent re-exacerbation of pain  Baseline:  Goal status: INITIAL  5.  FOTO score to be at or greater than predicted value at time of DC  Baseline:  Goal status: INITIAL    PLAN:  PT FREQUENCY: 2x/week  PT DURATION: 8 weeks  PLANNED INTERVENTIONS: 97164- PT Re-evaluation, 97110-Therapeutic exercises, 97530- Therapeutic activity, 97112-  Neuromuscular re-education, 97535- Self Care, 42595- Manual therapy, [id number]- Gait training, 97014- Electrical stimulation (unattended), [id number]- Traction (mechanical), and Dry Needling.  PLAN FOR NEXT SESSION: how was DN and repeat if desired. General mobility gains and functional core strengthening for work return. Biomechanics   Ivery Quale, PT, DPT 09/22/23 12:29 PM     Referring diagnosis? Diagnosis M54.6 (ICD-10-CM) - Acute right-sided thoracic back pain M54.50 (ICD-10-CM) - Acute right-sided low back pain without sciatica Treatment diagnosis? (if different than referring diagnosis) M54.59, M54.6, M26.81, R26.2, R29.898 What was this (referring dx) caused by? []  Surgery [x]  Fall []  Ongoing issue []  Arthritis []  Other: ____________  Laterality: []  Rt []  Lt [x]  Both  Check all possible CPT codes:  *CHOOSE 10 OR LESS*    See Planned Interventions listed in the Plan section of the Evaluation.

## 2023-09-22 NOTE — Telephone Encounter (Signed)
Requested Prescriptions   Pending Prescriptions Disp Refills   HYDROcodone-acetaminophen (NORCO/VICODIN) 5-325 MG tablet 30 tablet 0    Sig: Take 1 tablet by mouth every 6 (six) hours as needed for severe pain (pain score 7-10).   tiZANidine (ZANAFLEX) 4 MG tablet 30 tablet 0    Sig: Take 1 tablet (4 mg total) by mouth every 8 (eight) hours as needed for muscle spasms.     Date of patient request: 09/22/23 08/27/2023 10/03/2023 Date of last refill: 09/10/2023 Last refill amount: 30

## 2023-09-22 NOTE — Telephone Encounter (Signed)
Ortho eval noted on November 15.  No focal tenderness with palpation on that exam at that time, thought to be more muscular and affected by turning certain ways, getting up and down from a chair.  Physical therapy planned and follow-up as needed.   Appt with me on 10/03/23.   Called patient - has been using mm relaxant - in past week - now just one at night along with the hydrocodone. Wakes with pain at 2 am - good until until ran out last Thursday. Ran out of hydrocodone last week as well.   Advised to try taking tizanidine at bedtime instead of waiting until waking up with pain at 2 am. Short course hydrocodone if breakthrough pain, but trial of tizanidine alone first. Keep appt with me next week. Understanding expressed.   Controlled substance database (PDMP) reviewed. No concerns appreciated. Hydrocodone filled 09/10/23.

## 2023-09-22 NOTE — Telephone Encounter (Signed)
Encourage patient to contact the pharmacy for refills or they can request refills through [facility]   WHAT PHARMACY WOULD THEY LIKE THIS SENT TO:  [id number] - [place], [place] - [address] AT [address]   MEDICATION NAME & DOSE: tiZANidine (ZANAFLEX) 4 MG tablet HYDROcodone-acetaminophen (NORCO/VICODIN) 5-325 MG tablet  NOTES/COMMENTS FROM PATIENT: Pt states doing better and pain is less but meds are helping and he'd like refill.      Front office please notify patient: It takes 48-72 hours to process rx refill requests Ask patient to call pharmacy to ensure rx is ready before heading there.

## 2023-09-29 ENCOUNTER — Encounter: Payer: Self-pay | Admitting: Physical Therapy

## 2023-09-29 ENCOUNTER — Encounter: Payer: Medicare PPO | Admitting: Physical Therapy

## 2023-09-29 NOTE — Therapy (Signed)
PHYSICAL THERAPY DISCHARGE SUMMARY  Visits from Start of Care: 4  Current functional level related to goals / functional outcomes: Pt requested DC via phone call with front desk    Remaining deficits: DNT    Education / Equipment: N/A   Patient agrees to discharge. Patient goals were not met. Patient is being discharged due to the patient's request.   Nedra Hai, PT, DPT 09/29/23 8:38 AM

## 2023-10-01 ENCOUNTER — Encounter: Payer: Medicare PPO | Admitting: Physical Therapy

## 2023-10-03 ENCOUNTER — Ambulatory Visit (INDEPENDENT_AMBULATORY_CARE_PROVIDER_SITE_OTHER): Payer: Medicare PPO | Admitting: Family Medicine

## 2023-10-03 ENCOUNTER — Encounter: Payer: Self-pay | Admitting: Family Medicine

## 2023-10-03 VITALS — BP 120/62 | HR 71 | Temp 98.8°F | Ht 71.0 in | Wt 189.8 lb

## 2023-10-03 DIAGNOSIS — M545 Low back pain, unspecified: Secondary | ICD-10-CM

## 2023-10-03 DIAGNOSIS — K219 Gastro-esophageal reflux disease without esophagitis: Secondary | ICD-10-CM

## 2023-10-03 DIAGNOSIS — J029 Acute pharyngitis, unspecified: Secondary | ICD-10-CM

## 2023-10-03 DIAGNOSIS — M546 Pain in thoracic spine: Secondary | ICD-10-CM

## 2023-10-03 NOTE — Patient Instructions (Addendum)
Follow up with worker's comp provider regarding back pain and any change in treatment moving forward, as well as any possible restrictions needed.   Take pantoprazole 30 minutes prior to breakfast.  Add pepcid over the counter at bedtime. See list of foods of foods that can make heartburn worse. I will refer you to gastroenterology to see if other testing needed. Could also have you see ENT, but can start with plan above and recheck in 1 month.   Return to the clinic or go to the nearest emergency room if any of your symptoms worsen or new symptoms occur.    Food Choices for Gastroesophageal Reflux Disease, Adult When you have gastroesophageal reflux disease (GERD), the foods you eat and your eating habits are very important. Choosing the right foods can help ease the discomfort of GERD. Consider working with a dietitian to help you make healthy food choices. What are tips for following this plan? Reading food labels Look for foods that are low in saturated fat. Foods that have less than 5% of daily value (DV) of fat and 0 g of trans fats may help with your symptoms. Cooking Cook foods using methods other than frying. This may include baking, steaming, grilling, or broiling. These are all methods that do not need a lot of fat for cooking. To add flavor, try to use herbs that are low in spice and acidity. Meal planning  Choose healthy foods that are low in fat, such as fruits, vegetables, whole grains, low-fat dairy products, lean meats, fish, and poultry. Eat frequent, small meals instead of three large meals each day. Eat your meals slowly, in a relaxed setting. Avoid bending over or lying down until 2-3 hours after eating. Limit high-fat foods such as fatty meats or fried foods. Limit your intake of fatty foods, such as oils, butter, and shortening. Avoid the following as told by your health care provider: Foods that cause symptoms. These may be different for different people. Keep a food  diary to keep track of foods that cause symptoms. Alcohol. Drinking large amounts of liquid with meals. Eating meals during the 2-3 hours before bed. Lifestyle Maintain a healthy weight. Ask your health care provider what weight is healthy for you. If you need to lose weight, work with your health care provider to do so safely. Exercise for at least 30 minutes on 5 or more days each week, or as told by your health care provider. Avoid wearing clothes that fit tightly around your waist and chest. Do not use any products that contain nicotine or tobacco. These products include cigarettes, chewing tobacco, and vaping devices, such as e-cigarettes. If you need help quitting, ask your health care provider. Sleep with the head of your bed raised. Use a wedge under the mattress or blocks under the bed frame to raise the head of the bed. Chew sugar-free gum after mealtimes. What foods should I eat?  Eat a healthy, well-balanced diet of fruits, vegetables, whole grains, low-fat dairy products, lean meats, fish, and poultry. Each person is different. Foods that may trigger symptoms in one person may not trigger any symptoms in another person. Work with your health care provider to identify foods that are safe for you. The items listed above may not be a complete list of recommended foods and beverages. Contact a dietitian for more information. What foods should I avoid? Limiting some of these foods may help manage the symptoms of GERD. Everyone is different. Consult a dietitian or your health  care provider to help you identify the exact foods to avoid, if any. Fruits Any fruits prepared with added fat. Any fruits that cause symptoms. For some people this may include citrus fruits, such as oranges, grapefruit, pineapple, and lemons. Vegetables Deep-fried vegetables. [place] fries. Any vegetables prepared with added fat. Any vegetables that cause symptoms. For some people, this may include tomatoes and tomato  products, chili peppers, onions and garlic, and horseradish. Grains Pastries or quick breads with added fat. Meats and other proteins High-fat meats, such as fatty beef or pork, hot dogs, ribs, ham, sausage, salami, and bacon. Fried meat or protein, including fried fish and fried chicken. Nuts and nut butters, in large amounts. Dairy Whole milk and chocolate milk. Sour cream. Cream. Ice cream. Cream cheese. Milkshakes. Fats and oils Butter. Margarine. Shortening. Ghee. Beverages Coffee and tea, with or without caffeine. Carbonated beverages. Sodas. Energy drinks. Fruit juice made with acidic fruits, such as orange or grapefruit. Tomato juice. Alcoholic drinks. Sweets and desserts Chocolate and cocoa. Donuts. Seasonings and condiments Pepper. Peppermint and spearmint. Added salt. Any condiments, herbs, or seasonings that cause symptoms. For some people, this may include curry, hot sauce, or vinegar-based salad dressings. The items listed above may not be a complete list of foods and beverages to avoid. Contact a dietitian for more information. Questions to ask your health care provider Diet and lifestyle changes are usually the first steps that are taken to manage symptoms of GERD. If diet and lifestyle changes do not improve your symptoms, talk with your health care provider about taking medicines. Where to find more information [employer] for G[employer]intestinal [employer]: aboutgerd.org Summary When you have gastroesophageal reflux disease (GERD), food and lifestyle choices may be very helpful in easing the discomfort of GERD. Eat frequent, small meals instead of three large meals each day. Eat your meals slowly, in a relaxed setting. Avoid bending over or lying down until 2-3 hours after eating. Limit high-fat foods such as fatty meats or fried foods. This information is not intended to replace advice given to you by your health care provider. Make sure you discuss any questions  you have with your health care provider. Document Revised: 04/24/2020 Document Reviewed: 04/24/2020 Elsevier Patient Education  2024 [employer].

## 2023-10-03 NOTE — Progress Notes (Signed)
Subjective:  Patient ID: Peter Taylor, male    DOB: [birth year]  Age: 72 y.o. MRN: [id number]  CC:  Chief Complaint  Patient presents with   Back Pain    Went to PT Monday notes doing okay so far    Sore Throat    Notes a burning feeling in his throat off and on for several months and it will get sore at times, noted history of acid reflux and notes he does not miss doses of his pantoprazole     HPI Hyatt E Leeper presents for   Back pain See previous visits.  Right-sided thoracic and low back pain discussed that October 30 visit, mechanical fall few weeks prior.  Previous imaging indicating possible neuronal impingement, started on prednisone and referred to physical therapy October 30. Started PT November 12. Saw my colleague on November 13.  Reported no relief with naproxen or methocarbamol and felt sick on the prednisone.  Hydrocodone and tizanidine prescribed and referred to Ortho. Ortho eval on 09/12/2023.  Thought to be muscular pain, plan for continued physical therapy and follow-up as needed. Multiple PT sessions since that time. Called November 25 regarding medication refill.  At that time he was taking muscle relaxant and hydrocodone at night but was taking that in the middle of the night.  Advised to try taking tizanidine at bedtime instead of waiting till he wakes up with pain and short course of hydrocodone provided if needed for breakthrough pain.  PT helping. Pain improved, discharged from PT 12/2 due to patient request.   He has been followed by medical provider through [facility] comp for back pain. Had been referred to different PT, had eval and no treatment recommended.  No pain meds or mm relaxants recently - had gel to use for pain if needed. No further back pain at this time. Back at work - no difficulties on light duty.   Sore throat Past 6-8 months with burning sensation with swallowing. Some heartburn at times. history of reflux, treated with Protonix in the  morning. .  Denies missed doses of Protonix., Endoscopy in 2021.  Dr. Marina Goodell.  Normal esophagus, stomach, duodenum at that time. No dysphagia with solids or liquids.  Orange juice daily.  No fevers, night sweats or weight loss.  Few weeks ago had change in voice temporarily - normal now.   History Patient Active Problem List   Diagnosis Date Noted   Thoracic back pain 09/12/2023   Gastroesophageal reflux disease without esophagitis 01/16/2023   Type 2 diabetes mellitus without complication, without long-term current use of insulin (HCC) 01/16/2023   Thyroid nodule 01/16/2023   Elevated PSA 01/16/2023   Benign prostatic hyperplasia 01/16/2023   Dysphagia 08/01/2021   Multiple thyroid nodules 08/01/2021   Coronary artery disease    Hyperlipidemia 11/11/2019   Past Medical History:  Diagnosis Date   CAD (coronary artery disease), native coronary artery    a. Cath 12/10/19: 90% mLAD s/p DES, mild non obstructive disease in RCA and Cx   GERD (gastroesophageal reflux disease)    Hyperlipidemia    Substance abuse (HCC)    Tuberculosis    Past Surgical History:  Procedure Laterality Date   CORONARY STENT INTERVENTION N/A 12/10/2019   Procedure: CORONARY STENT INTERVENTION;  Surgeon: Corky Crafts, MD;  Location: [facility];  Service: Cardiovascular;  Laterality: N/A;   LEFT HEART CATH AND CORONARY ANGIOGRAPHY N/A 12/10/2019   Procedure: LEFT HEART CATH AND CORONARY ANGIOGRAPHY;  Surgeon: Eldridge Dace,  Donnie Coffin, MD;  Location: [facility] INVASIVE CV LAB;  Service: Cardiovascular;  Laterality: N/A;   NO PAST SURGERIES     Allergies  Allergen Reactions   Penicillins Hives    Did it involve swelling of the face/tongue/throat, SOB, or low BP? No Did it involve sudden or severe rash/hives, skin peeling, or any reaction on the inside of your mouth or nose? No Did you need to seek medical attention at a hospital or doctor's office? No When did it last happen?      35+ years If all above  answers are "NO", may proceed with cephalosporin use.    Prior to Admission medications   Medication Sig Start Date End Date Taking? Authorizing Provider  Accu-Chek Softclix Lancets lancets daily. 08/27/23   [provider]  atorvastatin (LIPITOR) 40 MG tablet TAKE 1 TABLET(40 MG) BY MOUTH DAILY 07/07/23   Shade Flood, MD  Blood Glucose Monitoring Suppl DEVI 1 each by Does not apply route daily in the afternoon. May substitute to any manufacturer covered by patient's insurance. 08/27/23   Shade Flood, MD  clopidogrel (PLAVIX) 75 MG tablet TAKE 1 TABLET(75 MG) BY MOUTH DAILY WITH BREAKFAST 07/07/23   Shade Flood, MD  HYDROcodone-acetaminophen (NORCO/VICODIN) 5-325 MG tablet Take 1 tablet by mouth every 6 (six) hours as needed for severe pain (pain score 7-10). 09/22/23   Shade Flood, MD  pantoprazole (PROTONIX) 40 MG tablet Take 1 tablet (40 mg total) by mouth daily. 07/07/23   Shade Flood, MD  tamsulosin (FLOMAX) 0.4 MG CAPS capsule Take 2 capsules (0.8 mg total) by mouth daily. TAKE 1 CAPSULE(0.4 MG) BY MOUTH DAILY 07/07/23   Shade Flood, MD  tiZANidine (ZANAFLEX) 4 MG tablet Take 1 tablet (4 mg total) by mouth every 8 (eight) hours as needed for muscle spasms. Start once at bedtime. 09/22/23   Shade Flood, MD   Social History   Socioeconomic History   Marital status: Single    Spouse name: Not on file   Number of children: 8   Years of education: Not on file   Highest education level: Not on file  Occupational History   Occupation: truck driver  Tobacco Use   Smoking status: Former   Smokeless tobacco: Former  Building services engineer status: Never Used  Substance and Sexual Activity   Alcohol use: Yes    Comment: occ   Drug use: No   Sexual activity: Not Currently  Other Topics Concern   Not on file  Social History Narrative   Not on file   Social Determinants of Health   Financial Resource Strain: Low Risk  (04/24/2023)   Overall  Financial Resource Strain (CARDIA)    Difficulty of Paying Living Expenses: Not hard at all  Food Insecurity: No Food Insecurity (04/24/2023)   Hunger Vital Sign    Worried About Running Out of Food in the Last Year: Never true    Ran Out of Food in the Last Year: Never true  Transportation Needs: No Transportation Needs (04/24/2023)   PRAPARE - Administrator, Civil Service (Medical): No    Lack of Transportation (Non-Medical): No  Physical Activity: Inactive (04/24/2023)   Exercise Vital Sign    Days of Exercise per Week: 0 days    Minutes of Exercise per Session: 0 min  Stress: No Stress Concern Present (04/24/2023)   [employer] of Occupational Health - Occupational Stress Questionnaire    Feeling of  Stress : Not at all  Social Connections: Unknown (04/24/2023)   Social Connection and Isolation Panel [NHANES]    Frequency of Communication with Friends and Family: More than three times a week    Frequency of Social Gatherings with Friends and Family: Never    Attends Religious Services: More than 4 times per year    Active Member of [employer] or Organizations: No    Attends Banker Meetings: Never    Marital Status: Not on file  Intimate Partner Violence: Not At Risk (04/24/2023)   Humiliation, Afraid, Rape, and Kick questionnaire    Fear of Current or Ex-Partner: No    Emotionally Abused: No    Physically Abused: No    Sexually Abused: No    Review of Systems  Per HPI Objective:   Vitals:   10/03/23 0943  BP: 120/62  Pulse: 71  Temp: 98.8 F (37.1 C)  TempSrc: Temporal  SpO2: 97%  Weight: 189 lb 12.8 oz (86.1 kg)  Height: 5\' 11"  (1.803 m)     Physical Exam Vitals reviewed.  Constitutional:      Appearance: He is well-developed.  HENT:     Head: Normocephalic and atraumatic.  Neck:     Vascular: No carotid bruit or JVD.     Comments: Nontender, no masses appreciated or lymphadenopathy, no stridor.  Clearing secretions normally, no pain  with swallowing. Cardiovascular:     Rate and Rhythm: Normal rate and regular rhythm.     Heart sounds: Normal heart sounds. No murmur heard. Pulmonary:     Effort: Pulmonary effort is normal.     Breath sounds: Normal breath sounds. No rales.  Abdominal:     General: Bowel sounds are normal. There is no distension.     Palpations: Abdomen is soft.     Tenderness: There is no abdominal tenderness.  Musculoskeletal:     Cervical back: Neck supple.     Right lower leg: No edema.     Left lower leg: No edema.  Lymphadenopathy:     Cervical: No cervical adenopathy.  Skin:    General: Skin is warm and dry.  Neurological:     Mental Status: He is alert and oriented to person, place, and time.  Psychiatric:        Mood and Affect: Mood normal.        Assessment & Plan:  JUNAYD SALAZAR is a 72 y.o. male . Gastroesophageal reflux disease without esophagitis - Plan: Ambulatory referral to Gastroenterology Sore throat - Plan: Ambulatory referral to Gastroenterology  -Possible breakthrough reflux as contributor to his throat symptoms.  Continue PPI daily, add H2 blocker, handout given on foods but when she is daily may be contributing.  Cut back on acidic foods.  Refer to gastroenterology to decide if repeat EGD needed or other testing.  Consider ENT eval if persistent symptoms in spite of treatment above.  Recheck 1 month, RTC precautions given.  Acute right-sided thoracic back pain Acute right-sided low back pain without sciatica  -Improved as above, now under the care of Worker's Comp.  No orders of the defined types were placed in this encounter.  Patient Instructions  Follow up with worker's comp provider regarding back pain and any change in treatment moving forward, as well as any possible restrictions needed.   Take pantoprazole 30 minutes prior to breakfast.  Add pepcid over the counter at bedtime. See list of foods of foods that can make heartburn worse. I will refer  you  to gastroenterology to see if other testing needed. Could also have you see ENT, but can start with plan above and recheck in 1 month.   Return to the clinic or go to the nearest emergency room if any of your symptoms worsen or new symptoms occur.    Food Choices for Gastroesophageal Reflux Disease, Adult When you have gastroesophageal reflux disease (GERD), the foods you eat and your eating habits are very important. Choosing the right foods can help ease the discomfort of GERD. Consider working with a dietitian to help you make healthy food choices. What are tips for following this plan? Reading food labels Look for foods that are low in saturated fat. Foods that have less than 5% of daily value (DV) of fat and 0 g of trans fats may help with your symptoms. Cooking Cook foods using methods other than frying. This may include baking, steaming, grilling, or broiling. These are all methods that do not need a lot of fat for cooking. To add flavor, try to use herbs that are low in spice and acidity. Meal planning  Choose healthy foods that are low in fat, such as fruits, vegetables, whole grains, low-fat dairy products, lean meats, fish, and poultry. Eat frequent, small meals instead of three large meals each day. Eat your meals slowly, in a relaxed setting. Avoid bending over or lying down until 2-3 hours after eating. Limit high-fat foods such as fatty meats or fried foods. Limit your intake of fatty foods, such as oils, butter, and shortening. Avoid the following as told by your health care provider: Foods that cause symptoms. These may be different for different people. Keep a food diary to keep track of foods that cause symptoms. Alcohol. Drinking large amounts of liquid with meals. Eating meals during the 2-3 hours before bed. Lifestyle Maintain a healthy weight. Ask your health care provider what weight is healthy for you. If you need to lose weight, work with your health care provider  to do so safely. Exercise for at least 30 minutes on 5 or more days each week, or as told by your health care provider. Avoid wearing clothes that fit tightly around your waist and chest. Do not use any products that contain nicotine or tobacco. These products include cigarettes, chewing tobacco, and vaping devices, such as e-cigarettes. If you need help quitting, ask your health care provider. Sleep with the head of your bed raised. Use a wedge under the mattress or blocks under the bed frame to raise the head of the bed. Chew sugar-free gum after mealtimes. What foods should I eat?  Eat a healthy, well-balanced diet of fruits, vegetables, whole grains, low-fat dairy products, lean meats, fish, and poultry. Each person is different. Foods that may trigger symptoms in one person may not trigger any symptoms in another person. Work with your health care provider to identify foods that are safe for you. The items listed above may not be a complete list of recommended foods and beverages. Contact a dietitian for more information. What foods should I avoid? Limiting some of these foods may help manage the symptoms of GERD. Everyone is different. Consult a dietitian or your health care provider to help you identify the exact foods to avoid, if any. Fruits Any fruits prepared with added fat. Any fruits that cause symptoms. For some people this may include citrus fruits, such as oranges, grapefruit, pineapple, and lemons. Vegetables Deep-fried vegetables. [place] fries. Any vegetables prepared with added fat. Any vegetables  that cause symptoms. For some people, this may include tomatoes and tomato products, chili peppers, onions and garlic, and horseradish. Grains Pastries or quick breads with added fat. Meats and other proteins High-fat meats, such as fatty beef or pork, hot dogs, ribs, ham, sausage, salami, and bacon. Fried meat or protein, including fried fish and fried chicken. Nuts and nut butters,  in large amounts. Dairy Whole milk and chocolate milk. Sour cream. Cream. Ice cream. Cream cheese. Milkshakes. Fats and oils Butter. Margarine. Shortening. Ghee. Beverages Coffee and tea, with or without caffeine. Carbonated beverages. Sodas. Energy drinks. Fruit juice made with acidic fruits, such as orange or grapefruit. Tomato juice. Alcoholic drinks. Sweets and desserts Chocolate and cocoa. Donuts. Seasonings and condiments Pepper. Peppermint and spearmint. Added salt. Any condiments, herbs, or seasonings that cause symptoms. For some people, this may include curry, hot sauce, or vinegar-based salad dressings. The items listed above may not be a complete list of foods and beverages to avoid. Contact a dietitian for more information. Questions to ask your health care provider Diet and lifestyle changes are usually the first steps that are taken to manage symptoms of GERD. If diet and lifestyle changes do not improve your symptoms, talk with your health care provider about taking medicines. Where to find more information International Foundation for Gastrointestinal Disorders: aboutgerd.org Summary When you have gastroesophageal reflux disease (GERD), food and lifestyle choices may be very helpful in easing the discomfort of GERD. Eat frequent, small meals instead of three large meals each day. Eat your meals slowly, in a relaxed setting. Avoid bending over or lying down until 2-3 hours after eating. Limit high-fat foods such as fatty meats or fried foods. This information is not intended to replace advice given to you by your health care provider. Make sure you discuss any questions you have with your health care provider. Document Revised: 04/24/2020 Document Reviewed: 04/24/2020 Elsevier Patient Education  2024 Elsevier Inc.     Signed,   Meredith Staggers, MD [facility] 10/03/23 10:25 AM

## 2023-10-06 ENCOUNTER — Encounter: Payer: Medicare PPO | Admitting: Physical Therapy

## 2023-10-08 ENCOUNTER — Encounter: Payer: Medicare PPO | Admitting: Rehabilitative and Restorative Service Providers"

## 2023-10-08 ENCOUNTER — Encounter: Payer: Medicare PPO | Admitting: Physical Therapy

## 2023-10-13 ENCOUNTER — Encounter: Payer: Medicare PPO | Admitting: Physical Therapy

## 2023-10-15 ENCOUNTER — Encounter: Payer: Medicare PPO | Admitting: Physical Therapy

## 2023-10-20 ENCOUNTER — Encounter: Payer: Medicare PPO | Admitting: Physical Therapy

## 2023-10-23 ENCOUNTER — Encounter: Payer: Medicare PPO | Admitting: Physical Therapy

## 2023-10-27 ENCOUNTER — Encounter: Payer: Medicare PPO | Admitting: Physical Therapy

## 2023-10-30 ENCOUNTER — Encounter: Payer: Medicare PPO | Admitting: Rehabilitative and Restorative Service Providers"

## 2023-11-03 ENCOUNTER — Ambulatory Visit (INDEPENDENT_AMBULATORY_CARE_PROVIDER_SITE_OTHER): Payer: Medicare PPO | Admitting: Family Medicine

## 2023-11-03 ENCOUNTER — Encounter: Payer: Medicare PPO | Admitting: Physical Therapy

## 2023-11-03 ENCOUNTER — Ambulatory Visit: Payer: Medicare PPO | Admitting: Family Medicine

## 2023-11-03 ENCOUNTER — Encounter: Payer: Self-pay | Admitting: Family Medicine

## 2023-11-03 VITALS — BP 130/78 | HR 92 | Temp 97.9°F | Ht 71.0 in | Wt 189.8 lb

## 2023-11-03 DIAGNOSIS — J029 Acute pharyngitis, unspecified: Secondary | ICD-10-CM | POA: Diagnosis not present

## 2023-11-03 DIAGNOSIS — R1310 Dysphagia, unspecified: Secondary | ICD-10-CM | POA: Diagnosis not present

## 2023-11-03 LAB — POCT RAPID STREP A (OFFICE): Rapid Strep A Screen: NEGATIVE

## 2023-11-03 NOTE — Patient Instructions (Addendum)
 Increase Pepcid  to twice per day.  Continue the pantoprazole  once per day and avoid any of the foods listed below that can worsen heartburn.  Soft foods for now, try to avoid crusty breads, or other foods that have caused more soreness.  Avoid sodas for now as well.  Try elevating the head of your bed. I will refer you to ear nose and throat specialist.  If any new or worsening symptoms in the meantime please let me know.  Return to the clinic or go to the nearest emergency room if any of your symptoms worsen or new symptoms occur.  Food Choices for Gastroesophageal Reflux Disease, Adult When you have gastroesophageal reflux disease (GERD), the foods you eat and your eating habits are very important. Choosing the right foods can help ease the discomfort of GERD. Consider working with a dietitian to help you make healthy food choices. What are tips for following this plan? Reading food labels Look for foods that are low in saturated fat. Foods that have less than 5% of daily value (DV) of fat and 0 g of trans fats may help with your symptoms. Cooking Cook foods using methods other than frying. This may include baking, steaming, grilling, or broiling. These are all methods that do not need a lot of fat for cooking. To add flavor, try to use herbs that are low in spice and acidity. Meal planning  Choose healthy foods that are low in fat, such as fruits, vegetables, whole grains, low-fat dairy products, lean meats, fish, and poultry. Eat frequent, small meals instead of three large meals each day. Eat your meals slowly, in a relaxed setting. Avoid bending over or lying down until 2-3 hours after eating. Limit high-fat foods such as fatty meats or fried foods. Limit your intake of fatty foods, such as oils, butter, and shortening. Avoid the following as told by your health care provider: Foods that cause symptoms. These may be different for different people. Keep a food diary to keep track of foods  that cause symptoms. Alcohol. Drinking large amounts of liquid with meals. Eating meals during the 2-3 hours before bed. Lifestyle Maintain a healthy weight. Ask your health care provider what weight is healthy for you. If you need to lose weight, work with your health care provider to do so safely. Exercise for at least 30 minutes on 5 or more days each week, or as told by your health care provider. Avoid wearing clothes that fit tightly around your waist and chest. Do not use any products that contain nicotine or tobacco. These products include cigarettes, chewing tobacco, and vaping devices, such as e-cigarettes. If you need help quitting, ask your health care provider. Sleep with the head of your bed raised. Use a wedge under the mattress or blocks under the bed frame to raise the head of the bed. Chew sugar-free gum after mealtimes. What foods should I eat?  Eat a healthy, well-balanced diet of fruits, vegetables, whole grains, low-fat dairy products, lean meats, fish, and poultry. Each person is different. Foods that may trigger symptoms in one person may not trigger any symptoms in another person. Work with your health care provider to identify foods that are safe for you. The items listed above may not be a complete list of recommended foods and beverages. Contact a dietitian for more information. What foods should I avoid? Limiting some of these foods may help manage the symptoms of GERD. Everyone is different. Consult a dietitian or your health care  provider to help you identify the exact foods to avoid, if any. Fruits Any fruits prepared with added fat. Any fruits that cause symptoms. For some people this may include citrus fruits, such as oranges, grapefruit, pineapple, and lemons. Vegetables Deep-fried vegetables. French fries. Any vegetables prepared with added fat. Any vegetables that cause symptoms. For some people, this may include tomatoes and tomato products, chili peppers,  onions and garlic, and horseradish. Grains Pastries or quick breads with added fat. Meats and other proteins High-fat meats, such as fatty beef or pork, hot dogs, ribs, ham, sausage, salami, and bacon. Fried meat or protein, including fried fish and fried chicken. Nuts and nut butters, in large amounts. Dairy Whole milk and chocolate milk. Sour cream. Cream. Ice cream. Cream cheese. Milkshakes. Fats and oils Butter. Margarine. Shortening. Ghee. Beverages Coffee and tea, with or without caffeine. Carbonated beverages. Sodas. Energy drinks. Fruit juice made with acidic fruits, such as orange or grapefruit. Tomato juice. Alcoholic drinks. Sweets and desserts Chocolate and cocoa. Donuts. Seasonings and condiments Pepper. Peppermint and spearmint. Added salt. Any condiments, herbs, or seasonings that cause symptoms. For some people, this may include curry, hot sauce, or vinegar-based salad dressings. The items listed above may not be a complete list of foods and beverages to avoid. Contact a dietitian for more information. Questions to ask your health care provider Diet and lifestyle changes are usually the first steps that are taken to manage symptoms of GERD. If diet and lifestyle changes do not improve your symptoms, talk with your health care provider about taking medicines. Where to find more information International Foundation for Gastrointestinal Disorders: aboutgerd.org Summary When you have gastroesophageal reflux disease (GERD), food and lifestyle choices may be very helpful in easing the discomfort of GERD. Eat frequent, small meals instead of three large meals each day. Eat your meals slowly, in a relaxed setting. Avoid bending over or lying down until 2-3 hours after eating. Limit high-fat foods such as fatty meats or fried foods. This information is not intended to replace advice given to you by your health care provider. Make sure you discuss any questions you have with your health  care provider. Document Revised: 04/24/2020 Document Reviewed: 04/24/2020 Elsevier Patient Education  2024 [employer].

## 2023-11-03 NOTE — Progress Notes (Signed)
 Subjective:  Patient ID: Peter Taylor, male    DOB: 04-07-1951  Age: 73 y.o. MRN: [id number]  CC:  Chief Complaint  Patient presents with   Sore Throat    Pt notes a burning feeling in the mornings, Dr Nunzio makes it feel worse, some foods hurt going down, notes it is about the same as last month     HPI SHUAYB SCHEPERS presents for   Sore throat follow-up from December 6 visit.  Possible breakthrough reflux as contributor to his throat symptoms at that time.  Was continued on a PPI daily, added H2 blocker.  Handout given on trigger foods for reflux, including decreasing orange juice intake.  Additionally was referred to gastroenterology to decide if repeat EGD needed, previous 1 in 2021 with Dr. Abran, normal esophagus stomach and duodenum at that time. Stopped orange juice. Symptoms are about the same with some discomfort swallowing certain foods - anything with roughage like crusty bread.  Ginger ale, Dr. Nunzio has made symptoms worse - burning sensation in throat.  No significant change with use of Pepcid  at bedtime.  Denies weight loss, night sweats, fevers, no vomiting/regurgitation of food, but catching feeling with swallowing. Able to clear secretions and swallowing solid and liquids. Drink of water  before food is helpful.  Episodic change in voice, hoarse at times, not persistent. No white patches in mouth/throat.  No abd pain or chest pain.  Not immunosuppressed, no steroid inhalers.   History Patient Active Problem List   Diagnosis Date Noted   Thoracic back pain 09/12/2023   Gastroesophageal reflux disease without esophagitis 01/16/2023   Type 2 diabetes mellitus without complication, without long-term current use of insulin (HCC) 01/16/2023   Thyroid  nodule 01/16/2023   Elevated PSA 01/16/2023   Benign prostatic hyperplasia 01/16/2023   Dysphagia 08/01/2021   Multiple thyroid  nodules 08/01/2021   Coronary artery disease    Hyperlipidemia 11/11/2019   Past  Medical History:  Diagnosis Date   CAD (coronary artery disease), native coronary artery    a. Cath 12/10/19: 90% mLAD s/p DES, mild non obstructive disease in RCA and Cx   GERD (gastroesophageal reflux disease)    Hyperlipidemia    Substance abuse (HCC)    Tuberculosis    Past Surgical History:  Procedure Laterality Date   CORONARY STENT INTERVENTION N/A 12/10/2019   Procedure: CORONARY STENT INTERVENTION;  Surgeon: Dann Candyce RAMAN, MD;  Location: [facility];  Service: Cardiovascular;  Laterality: N/A;   LEFT HEART CATH AND CORONARY ANGIOGRAPHY N/A 12/10/2019   Procedure: LEFT HEART CATH AND CORONARY ANGIOGRAPHY;  Surgeon: Dann Candyce RAMAN, MD;  Location: [facility] INVASIVE CV LAB;  Service: Cardiovascular;  Laterality: N/A;   NO PAST SURGERIES     Allergies  Allergen Reactions   Penicillins Hives    Did it involve swelling of the face/tongue/throat, SOB, or low BP? No Did it involve sudden or severe rash/hives, skin peeling, or any reaction on the inside of your mouth or nose? No Did you need to seek medical attention at a hospital or doctor's office? No When did it last happen?      35+ years If all above answers are "NO", may proceed with cephalosporin use.    Prior to Admission medications   Medication Sig Start Date End Date Taking? Authorizing Provider  Accu-Chek Softclix Lancets lancets daily. 08/27/23  Yes [provider]  atorvastatin  (LIPITOR) 40 MG tablet TAKE 1 TABLET(40 MG) BY MOUTH DAILY 07/07/23  Yes Levora,  Reyes SAUNDERS, MD  Blood Glucose Monitoring Suppl DEVI 1 each by Does not apply route daily in the afternoon. May substitute to any manufacturer covered by patient's insurance. 08/27/23  Yes Levora Reyes SAUNDERS, MD  clopidogrel  (PLAVIX ) 75 MG tablet TAKE 1 TABLET(75 MG) BY MOUTH DAILY WITH BREAKFAST 07/07/23  Yes Levora Reyes SAUNDERS, MD  HYDROcodone -acetaminophen  (NORCO/VICODIN) 5-325 MG tablet Take 1 tablet by mouth every 6 (six) hours as needed for severe  pain (pain score 7-10). 09/22/23  Yes Levora Reyes SAUNDERS, MD  pantoprazole  (PROTONIX ) 40 MG tablet Take 1 tablet (40 mg total) by mouth daily. 07/07/23  Yes Levora Reyes SAUNDERS, MD  tamsulosin  (FLOMAX ) 0.4 MG CAPS capsule Take 2 capsules (0.8 mg total) by mouth daily. TAKE 1 CAPSULE(0.4 MG) BY MOUTH DAILY 07/07/23  Yes Levora Reyes SAUNDERS, MD  tiZANidine  (ZANAFLEX ) 4 MG tablet Take 1 tablet (4 mg total) by mouth every 8 (eight) hours as needed for muscle spasms. Start once at bedtime. 09/22/23  Yes Levora Reyes SAUNDERS, MD   Social History   Socioeconomic History   Marital status: Single    Spouse name: Not on file   Number of children: 8   Years of education: Not on file   Highest education level: Not on file  Occupational History   Occupation: truck driver  Tobacco Use   Smoking status: Former   Smokeless tobacco: Former  Building Services Engineer status: Never Used  Substance and Sexual Activity   Alcohol use: Yes    Comment: occ   Drug use: No   Sexual activity: Not Currently  Other Topics Concern   Not on file  Social History Narrative   Not on file   Social Drivers of Health   Financial Resource Strain: Low Risk  (04/24/2023)   Overall Financial Resource Strain (CARDIA)    Difficulty of Paying Living Expenses: Not hard at all  Food Insecurity: No Food Insecurity (04/24/2023)   Hunger Vital Sign    Worried About Running Out of Food in the Last Year: Never true    Ran Out of Food in the Last Year: Never true  Transportation Needs: No Transportation Needs (04/24/2023)   PRAPARE - Administrator, Civil Service (Medical): No    Lack of Transportation (Non-Medical): No  Physical Activity: Inactive (04/24/2023)   Exercise Vital Sign    Days of Exercise per Week: 0 days    Minutes of Exercise per Session: 0 min  Stress: No Stress Concern Present (04/24/2023)   [employer] of Occupational Health - Occupational Stress Questionnaire    Feeling of Stress : Not at all  Social  Connections: Unknown (04/24/2023)   Social Connection and Isolation Panel [NHANES]    Frequency of Communication with Friends and Family: More than three times a week    Frequency of Social Gatherings with Friends and Family: Never    Attends Religious Services: More than 4 times per year    Active Member of [employer] or Organizations: No    Attends Banker Meetings: Never    Marital Status: Not on file  Intimate Partner Violence: Not At Risk (04/24/2023)   Humiliation, Afraid, Rape, and Kick questionnaire    Fear of Current or Ex-Partner: No    Emotionally Abused: No    Physically Abused: No    Sexually Abused: No    Review of Systems   Objective:   Vitals:   11/03/23 1140  BP: 130/78  Pulse: 92  Temp:  97.9 F (36.6 C)  TempSrc: Temporal  SpO2: 99%  Weight: 189 lb 12.8 oz (86.1 kg)  Height: 5' 11 (1.803 m)     Physical Exam Vitals reviewed.  Constitutional:      Appearance: He is well-developed.  HENT:     Head: Normocephalic and atraumatic.     Mouth/Throat:     Mouth: Mucous membranes are moist.     Pharynx: Posterior oropharyngeal erythema (Some posterior oropharynx erythema without exudate, no white patches.  Pendulous uvula, no vesicles or ulcerations.  Visualized portion of throat without any white patches or lesions.) present.  Neck:     Vascular: No carotid bruit or JVD.  Cardiovascular:     Rate and Rhythm: Normal rate and regular rhythm.     Heart sounds: Normal heart sounds. No murmur heard. Pulmonary:     Effort: Pulmonary effort is normal.     Breath sounds: Normal breath sounds. No stridor. No rales.  Abdominal:     Tenderness: There is no abdominal tenderness.  Musculoskeletal:     Right lower leg: No edema.     Left lower leg: No edema.  Skin:    General: Skin is warm and dry.  Neurological:     Mental Status: He is alert and oriented to person, place, and time.  Psychiatric:        Mood and Affect: Mood normal.     Results for  orders placed or performed in visit on 11/03/23  POCT rapid strep A   Collection Time: 11/03/23 12:33 PM  Result Value Ref Range   Rapid Strep A Screen Negative Negative      Assessment & Plan:  ANTWAIN CALIENDO is a 73 y.o. male . Sore throat - Plan: Ambulatory referral to ENT, POCT rapid strep A  Dysphagia, unspecified type - Plan: Ambulatory referral to ENT, POCT rapid strep A Persistent sore throat, dysphagia.  Suspect component of reflux with some erythema and appearance of uvula.  No known risk factors for thrush and no white coating or other signs of thrush at this time.  No stridor, clearing secretions, no other red flags on history.  -GI referral pending.  ENT referral placed for further evaluation of upper airway symptoms.  -Increase Pepcid  to twice daily dosing, continue PPI daily, elevate head of bed, not just elevated pillow as he has been using.  Avoid trigger foods.  RTC/ER precautions given.  No orders of the defined types were placed in this encounter.  Patient Instructions  Increase Pepcid  to twice per day.  Continue the pantoprazole  once per day and avoid any of the foods listed below that can worsen heartburn.  Soft foods for now, try to avoid crusty breads, or other foods that have caused more soreness.  Avoid sodas for now as well.  Try elevating the head of your bed. I will refer you to ear nose and throat specialist.  If any new or worsening symptoms in the meantime please let me know.      Signed,   Reyes Pines, MD [facility], [facility] 11/03/23 12:36 PM

## 2023-11-05 ENCOUNTER — Encounter: Payer: Medicare PPO | Admitting: Rehabilitative and Restorative Service Providers"

## 2023-12-01 ENCOUNTER — Encounter (INDEPENDENT_AMBULATORY_CARE_PROVIDER_SITE_OTHER): Payer: Self-pay | Admitting: Otolaryngology

## 2023-12-01 ENCOUNTER — Ambulatory Visit (INDEPENDENT_AMBULATORY_CARE_PROVIDER_SITE_OTHER): Payer: Medicare PPO | Admitting: Otolaryngology

## 2023-12-01 VITALS — BP 147/79 | HR 60 | Ht 71.0 in | Wt 186.0 lb

## 2023-12-01 DIAGNOSIS — R1310 Dysphagia, unspecified: Secondary | ICD-10-CM | POA: Diagnosis not present

## 2023-12-01 DIAGNOSIS — K219 Gastro-esophageal reflux disease without esophagitis: Secondary | ICD-10-CM | POA: Diagnosis not present

## 2023-12-01 DIAGNOSIS — R070 Pain in throat: Secondary | ICD-10-CM | POA: Diagnosis not present

## 2023-12-01 DIAGNOSIS — R09A2 Foreign body sensation, throat: Secondary | ICD-10-CM

## 2023-12-01 DIAGNOSIS — J3089 Other allergic rhinitis: Secondary | ICD-10-CM

## 2023-12-01 DIAGNOSIS — R0982 Postnasal drip: Secondary | ICD-10-CM | POA: Diagnosis not present

## 2023-12-01 MED ORDER — FLUTICASONE PROPIONATE 50 MCG/ACT NA SUSP: 2.0000 | Freq: Two times a day (BID) | NASAL | 6 refills | Status: DC

## 2023-12-01 MED ORDER — CETIRIZINE HCL 10 MG PO TABS: 10.0000 mg | ORAL_TABLET | Freq: Every day | ORAL | 11 refills | Status: AC

## 2023-12-01 NOTE — Patient Instructions (Signed)

## 2023-12-01 NOTE — Progress Notes (Signed)
ENT CONSULT:  Reason for Consult: throat discomfort x 2 yrs and dysphagia x 2 yrs   HPI: Discussed the use of AI scribe software for clinical note transcription with the patient, who gave verbal consent to proceed.  History of Present Illness   The patient  is a 39 yoM who presents with chronic throat discomfort and swallowing difficulties.  Chronic throat discomfort and swallowing difficulties have persisted for a couple of years, described as a sensation similar to 'swallowing a fish bone.' These symptoms occur with both solids and liquids, with temporary relief from drinking water. Occasional voice changes with raspy voice. Reflux medication has not improved symptoms (on Protonix 40 mg daily). An upper endoscopy in 2021 showed clear vocal cords and no foreign bodies. Occasional coughing or choking on liquids occurs inconsistently.  Shortness of breath is experienced regardless of activity level and has not been previously discussed with their primary care doctor.  A past issue with ear pain about four months ago was treated with medication and is not ongoing.  They have a history of smoking, having quit approximately eight years ago.       Records Reviewed:  11/03/2023 PCP Dr Neva Seat Sore throat follow-up from December 6 visit.  Possible breakthrough reflux as contributor to his throat symptoms at that time.  Was continued on a PPI daily, added H2 blocker.  Handout given on trigger foods for reflux, including decreasing orange juice intake.  Additionally was referred to gastroenterology to decide if repeat EGD needed, previous 1 in 2021 with Dr. Marina Goodell, normal esophagus stomach and duodenum at that time. Stopped orange juice. Symptoms are about the same with some discomfort swallowing certain foods - anything with roughage like crusty bread.  Ginger ale, Dr. Reino Kent has made symptoms worse - burning sensation in throat.  No significant change with use of Pepcid at bedtime.  Denies weight loss,  night sweats, fevers, no vomiting/regurgitation of food, but catching feeling with swallowing. Able to clear secretions and swallowing solid and liquids. Drink of water before food is helpful.  Episodic change in voice, hoarse at times, not persistent. No white patches in mouth/throat.  No abd pain or chest pain.  Not immunosuppressed, no steroid inhalers.     Past Medical History:  Diagnosis Date   CAD (coronary artery disease), native coronary artery    a. Cath 12/10/19: 90% mLAD s/p DES, mild non obstructive disease in RCA and Cx   GERD (gastroesophageal reflux disease)    Hyperlipidemia    Substance abuse (HCC)    Tuberculosis     Past Surgical History:  Procedure Laterality Date   CORONARY STENT INTERVENTION N/A 12/10/2019   Procedure: CORONARY STENT INTERVENTION;  Surgeon: Corky Crafts, MD;  Location: [facility] INVASIVE CV LAB;  Service: Cardiovascular;  Laterality: N/A;   LEFT HEART CATH AND CORONARY ANGIOGRAPHY N/A 12/10/2019   Procedure: LEFT HEART CATH AND CORONARY ANGIOGRAPHY;  Surgeon: Corky Crafts, MD;  Location: [facility] INVASIVE CV LAB;  Service: Cardiovascular;  Laterality: N/A;   NO PAST SURGERIES      Family History  Problem Relation Age of Onset   Cancer Mother        unsure type   Cancer Brother        pancreatic   Ovarian cancer Sister    Ovarian cancer Sister    Ovarian cancer Sister     Social History:  reports that he has quit smoking. He has quit using smokeless tobacco. He reports current alcohol use.  He reports that he does not use drugs.  Allergies:  Allergies  Allergen Reactions   Penicillins Hives    Did it involve swelling of the face/tongue/throat, SOB, or low BP? No Did it involve sudden or severe rash/hives, skin peeling, or any reaction on the inside of your mouth or nose? No Did you need to seek medical attention at a hospital or doctor's office? No When did it last happen?      35+ years If all above answers are "NO", may proceed with  cephalosporin use.     Medications: I have reviewed the patient's current medications.  The PMH, PSH, Medications, Allergies, and SH were reviewed and updated.  ROS: Constitutional: Negative for fever, weight loss and weight gain. Cardiovascular: Negative for chest pain and dyspnea on exertion. Respiratory: Is not experiencing shortness of breath at rest. Gastrointestinal: Negative for nausea and vomiting. Neurological: Negative for headaches. Psychiatric: The patient is not nervous/anxious  Blood pressure (!) 147/79, pulse 60, height 5\' 11"  (1.803 m), weight 186 lb (84.4 kg), SpO2 97%.  PHYSICAL EXAM:  Exam: General: Well-developed, well-nourished Communication and Voice: slightly raspy Respiratory Respiratory effort: Equal inspiration and expiration without stridor Cardiovascular Peripheral Vascular: Warm extremities with equal color/perfusion Eyes: No nystagmus with equal extraocular motion bilaterally Neuro/Psych/Balance: Patient oriented to person, place, and time; Appropriate mood and affect; Gait is intact with no imbalance; Cranial nerves I-XII are intact Head and Face Inspection: Normocephalic and atraumatic without mass or lesion Palpation: Facial skeleton intact without bony stepoffs Salivary Glands: No mass or tenderness Facial Strength: Facial motility symmetric and full bilaterally ENT Pinna: External ear intact and fully developed External canal: Canal is patent with intact skin Tympanic Membrane: Clear and mobile External Nose: No scar or anatomic deformity Internal Nose: Septum is deviated to the left. No polyp, or purulence. Mucosal edema and erythema present.  Bilateral inferior turbinate hypertrophy.  Lips, Teeth, and gums: Mucosa and teeth intact and viable TMJ: No pain to palpation with full mobility Oral cavity/oropharynx: No erythema or exudate, no lesions present Nasopharynx: No mass or lesion with intact mucosa Hypopharynx: Intact mucosa without  pooling of secretions Larynx Glottic: Full true vocal cord mobility without lesion or mass Supraglottic: Normal appearing epiglottis and AE folds Interarytenoid Space: Moderate pachydermia&edema Subglottic Space: Patent without lesion or edema Neck Neck and Trachea: Midline trachea without mass or lesion Thyroid: No mass or nodularity Lymphatics: No lymphadenopathy  Procedure: Preoperative diagnosis: throat discomfort, hoarseness dysphagia   Postoperative diagnosis:   Same + GERD LPR  Procedure: Flexible fiberoptic laryngoscopy  Surgeon: Ashok Croon, MD  Anesthesia: Topical lidocaine and Afrin Complications: None Condition is stable throughout exam  Indications and consent:  The patient presents to the clinic with Indirect laryngoscopy view was incomplete. Thus it was recommended that they undergo a flexible fiberoptic laryngoscopy. All of the risks, benefits, and potential complications were reviewed with the patient preoperatively and verbal informed consent was obtained.  Procedure: The patient was seated upright in the clinic. Topical lidocaine and Afrin were applied to the nasal cavity. After adequate anesthesia had occurred, I then proceeded to pass the flexible telescope into the nasal cavity. The nasal cavity was patent without rhinorrhea or polyp. The nasopharynx was also patent without mass or lesion. The base of tongue was visualized and was normal. There were no signs of pooling of secretions in the piriform sinuses. The true vocal folds were mobile bilaterally. There were no signs of glottic or supraglottic mucosal lesion or mass. There  was moderate interarytenoid pachydermia and post cricoid edema. The telescope was then slowly withdrawn and the patient tolerated the procedure throughout.  Studies Reviewed: EGD 12/07/19    Assessment/Plan: Encounter Diagnoses  Name Primary?   Dysphagia, unspecified type    Throat discomfort Yes   Chronic GERD    Globus sensation     Environmental and seasonal allergies    Post-nasal drip     Assessment and Plan    Chronic Throat Discomfort Chronic throat discomfort for a couple of years, described as feeling like swallowing a fish bone. Symptoms include dysphagia, intermittent dysphonia, and frequent throat clearing. No significant findings on scope exam; vocal cords clear, no foreign bodies, normal movement. He did have findings c/w GERD LPR. Likely related to chronic throat irritation from GERD and postnasal drainage. Discussed that reflux can cause tightening of the upper esophageal sphincter, leading to symptoms of dysphagia. He had normal EGD in 2021. Recommended dietary changes and avoiding lying down after eating to manage reflux. Suggested Reflux Gourmet (seaweed paste) as a supplement to prevent stomach contents from irritating the throat. - Continue Protonix  40 mg in the morning - Order swallow study to evaluate dysphagia sx - Recommend dietary changes to manage reflux - Avoid lying down immediately after eating - Recommend Reflux Gourmet (seaweed paste) after meals, especially after dinner - Gargle with salt water to alleviate throat irritation  Chronic Gastroesophageal Reflux Disease (GERD) Persistent symptoms despite current Protonix regimen. Symptoms include throat discomfort and intermittent dysphonia. No improvement noted with current treatment (Protonix). Discussed that reflux can cause chronic throat irritation and tightening of the upper esophageal sphincter. Recommended dietary changes and avoiding lying down after eating to manage reflux. Suggested Reflux Gourmet (seaweed paste) as a supplement to prevent stomach contents from irritating the throat. - Continue Protonix in the morning - Recommend Reflux Gourmet (seaweed paste) after meals, especially after dinner - Recommend dietary changes to manage reflux - Avoid lying down immediately after eating  Dysphagia to solids mostly  - MBS  esophagram - scope exam without pooling of secretions in pyriforms, and he is able to tolerate regular diet currently  Chronic Postnasal Drip Frequent throat clearing and sensation of drainage from the nasopharynx to the throat. Discussed that postnasal drainage can contribute to throat irritation. Recommended treating with Flonase and Zyrtec. - Prescribe Flonase nasal spray 2 puffs b/l nares - Prescribe Zyrtec 10 mg daily   Follow-up - Schedule follow-up appointment after swallow study results are available.     Thank you for allowing me to participate in the care of this patient. Please do not hesitate to contact me with any questions or concerns.   Ashok Croon, MD Otolaryngology [facility] Phone: [phone] Fax: [phone]    12/01/2023, 11:25 AM

## 2023-12-02 ENCOUNTER — Other Ambulatory Visit (HOSPITAL_COMMUNITY): Payer: Self-pay | Admitting: *Deleted

## 2023-12-02 DIAGNOSIS — R1310 Dysphagia, unspecified: Secondary | ICD-10-CM

## 2023-12-05 ENCOUNTER — Ambulatory Visit: Payer: Medicare PPO | Admitting: Family Medicine

## 2023-12-05 ENCOUNTER — Encounter: Payer: Self-pay | Admitting: Family Medicine

## 2023-12-05 VITALS — BP 122/64 | HR 54 | Temp 97.7°F | Wt 191.0 lb

## 2023-12-05 DIAGNOSIS — J029 Acute pharyngitis, unspecified: Secondary | ICD-10-CM | POA: Diagnosis not present

## 2023-12-05 DIAGNOSIS — R1310 Dysphagia, unspecified: Secondary | ICD-10-CM

## 2023-12-05 NOTE — Progress Notes (Signed)
 Subjective:  Patient ID: Peter Taylor, male    DOB: [birth year]  Age: 73 y.o. MRN: [id number]  CC:  Chief Complaint  Patient presents with   Sore Throat    HPI Peter Taylor presents for   Sore throat: Follow up. Last discussed 11/03/23.  Initially discussed in December.  Possible reflux as contributor, was continued on PPI, added H2 blocker and trigger foods discussed.  Referred to gastroenterology.  Minimal change in symptoms with avoiding triggers.  Still had symptoms with swallowing certain foods, like crusty bread.  Minimal change in symptoms with addition of Pepcid  at bedtime.  Episodic hoarse voice discussed last visit, no sign of mycotic infection on exam.  No history of immunosuppression or use of steroid inhalers.  I increased his Pepcid  to twice per day, GI referral is pending, but also referred to ENT to evaluate the upper airway symptoms.  He was seen by ENT, Dr. Okey on 12/01/2023.  Note reviewed.  No significant findings on scope exam, vocal cords were clear no foreign bodies normal movement but did have some findings consistent with GERD, LPR.  Thought to have chronic throat irritation from GERD and postnasal drainage.  Prior normal EGD in 2021.  Continue dietary changes and avoid lying down after eating to manage reflux, supplement recommended, continue Protonix  40 mg daily, swallowing study was ordered to evaluate dysphagia symptoms and dietary changes to manage reflux.  Salt water  gargles as needed to alleviate throat irritation.  Modified barium swallow planned.  Flonase  and Zyrtec  prescribed for chronic postnasal drip.  Swallow study pending on February 25.  Has been taking flonase  and zyrtec  past few days. Feels better - breathing better, less throat symptoms.   GI referral noted, voicemail was left on patient's contact information to call back to schedule.  3 days ago - he did not receive.   Followed by Dr. Eletha for thyroid  nodules - needs number to schedule follow  up.   History Patient Active Problem List   Diagnosis Date Noted   Thoracic back pain 09/12/2023   Gastroesophageal reflux disease without esophagitis 01/16/2023   Type 2 diabetes mellitus without complication, without long-term current use of insulin (HCC) 01/16/2023   Thyroid  nodule 01/16/2023   Elevated PSA 01/16/2023   Benign prostatic hyperplasia 01/16/2023   Dysphagia 08/01/2021   Multiple thyroid  nodules 08/01/2021   Coronary artery disease    Hyperlipidemia 11/11/2019   Past Medical History:  Diagnosis Date   CAD (coronary artery disease), native coronary artery    a. Cath 12/10/19: 90% mLAD s/p DES, mild non obstructive disease in RCA and Cx   GERD (gastroesophageal reflux disease)    Hyperlipidemia    Substance abuse (HCC)    Tuberculosis    Past Surgical History:  Procedure Laterality Date   CORONARY STENT INTERVENTION N/A 12/10/2019   Procedure: CORONARY STENT INTERVENTION;  Surgeon: Dann Candyce RAMAN, MD;  Location: [facility] INVASIVE CV LAB;  Service: Cardiovascular;  Laterality: N/A;   LEFT HEART CATH AND CORONARY ANGIOGRAPHY N/A 12/10/2019   Procedure: LEFT HEART CATH AND CORONARY ANGIOGRAPHY;  Surgeon: Dann Candyce RAMAN, MD;  Location: [facility] INVASIVE CV LAB;  Service: Cardiovascular;  Laterality: N/A;   NO PAST SURGERIES     Allergies  Allergen Reactions   Penicillins Hives    Did it involve swelling of the face/tongue/throat, SOB, or low BP? No Did it involve sudden or severe rash/hives, skin peeling, or any reaction on the inside of your mouth or nose? No  Did you need to seek medical attention at a hospital or doctor's office? No When did it last happen?      35+ years If all above answers are "NO", may proceed with cephalosporin use.    Prior to Admission medications   Medication Sig Start Date End Date Taking? Authorizing Provider  Accu-Chek Softclix Lancets lancets daily. 08/27/23   [provider]  atorvastatin  (LIPITOR) 40 MG tablet TAKE 1  TABLET(40 MG) BY MOUTH DAILY 07/07/23   Levora Reyes SAUNDERS, MD  Blood Glucose Monitoring Suppl DEVI 1 each by Does not apply route daily in the afternoon. May substitute to any manufacturer covered by patient's insurance. 08/27/23   Levora Reyes SAUNDERS, MD  cetirizine  (ZYRTEC ) 10 MG tablet Take 1 tablet (10 mg total) by mouth daily. 12/01/23   Soldatova, Liuba, MD  clopidogrel  (PLAVIX ) 75 MG tablet TAKE 1 TABLET(75 MG) BY MOUTH DAILY WITH BREAKFAST 07/07/23   Levora Reyes SAUNDERS, MD  fluticasone  (FLONASE ) 50 MCG/ACT nasal spray Place 2 sprays into both nostrils 2 (two) times daily. 12/01/23   Soldatova, Liuba, MD  HYDROcodone -acetaminophen  (NORCO/VICODIN) 5-325 MG tablet Take 1 tablet by mouth every 6 (six) hours as needed for severe pain (pain score 7-10). 09/22/23   Levora Reyes SAUNDERS, MD  pantoprazole  (PROTONIX ) 40 MG tablet Take 1 tablet (40 mg total) by mouth daily. 07/07/23   Levora Reyes SAUNDERS, MD  tamsulosin  (FLOMAX ) 0.4 MG CAPS capsule Take 2 capsules (0.8 mg total) by mouth daily. TAKE 1 CAPSULE(0.4 MG) BY MOUTH DAILY 07/07/23   Levora Reyes SAUNDERS, MD  tiZANidine  (ZANAFLEX ) 4 MG tablet Take 1 tablet (4 mg total) by mouth every 8 (eight) hours as needed for muscle spasms. Start once at bedtime. 09/22/23   Levora Reyes SAUNDERS, MD   Social History   Socioeconomic History   Marital status: Single    Spouse name: Not on file   Number of children: 8   Years of education: Not on file   Highest education level: Not on file  Occupational History   Occupation: truck driver  Tobacco Use   Smoking status: Former   Smokeless tobacco: Former  Building Services Engineer status: Never Used  Substance and Sexual Activity   Alcohol use: Yes    Comment: occ   Drug use: No   Sexual activity: Not Currently  Other Topics Concern   Not on file  Social History Narrative   Not on file   Social Drivers of Health   Financial Resource Strain: Low Risk  (04/24/2023)   Overall Financial Resource Strain (CARDIA)    Difficulty  of Paying Living Expenses: Not hard at all  Food Insecurity: No Food Insecurity (04/24/2023)   Hunger Vital Sign    Worried About Running Out of Food in the Last Year: Never true    Ran Out of Food in the Last Year: Never true  Transportation Needs: No Transportation Needs (04/24/2023)   PRAPARE - Administrator, Civil Service (Medical): No    Lack of Transportation (Non-Medical): No  Physical Activity: Inactive (04/24/2023)   Exercise Vital Sign    Days of Exercise per Week: 0 days    Minutes of Exercise per Session: 0 min  Stress: No Stress Concern Present (04/24/2023)   [employer] of Occupational Health - Occupational Stress Questionnaire    Feeling of Stress : Not at all  Social Connections: Unknown (04/24/2023)   Social Connection and Isolation Panel [NHANES]    Frequency of Communication  with Friends and Family: More than three times a week    Frequency of Social Gatherings with Friends and Family: Never    Attends Religious Services: More than 4 times per year    Active Member of [employer] or Organizations: No    Attends Banker Meetings: Never    Marital Status: Not on file  Intimate Partner Violence: Not At Risk (04/24/2023)   Humiliation, Afraid, Rape, and Kick questionnaire    Fear of Current or Ex-Partner: No    Emotionally Abused: No    Physically Abused: No    Sexually Abused: No    Review of Systems   Objective:   Vitals:   12/05/23 1012  BP: 122/64  Pulse: (!) 54  Temp: 97.7 F (36.5 C)  SpO2: 98%  Weight: 191 lb (86.6 kg)     Physical Exam Vitals reviewed.  Constitutional:      Appearance: He is well-developed.  HENT:     Head: Normocephalic and atraumatic.     Right Ear: External ear normal.     Left Ear: External ear normal.     Nose: No rhinorrhea.     Mouth/Throat:     Pharynx: No oropharyngeal exudate or posterior oropharyngeal erythema.  Eyes:     Conjunctiva/sclera: Conjunctivae normal.     Pupils: Pupils are  equal, round, and reactive to light.  Neck:     Vascular: No carotid bruit or JVD.  Cardiovascular:     Rate and Rhythm: Normal rate and regular rhythm.     Heart sounds: Normal heart sounds. No murmur heard. Pulmonary:     Effort: Pulmonary effort is normal.     Breath sounds: Normal breath sounds. No wheezing, rhonchi or rales.  Abdominal:     Palpations: Abdomen is soft.     Tenderness: There is no abdominal tenderness.  Musculoskeletal:     Cervical back: Neck supple.     Right lower leg: No edema.     Left lower leg: No edema.  Lymphadenopathy:     Cervical: No cervical adenopathy.  Skin:    General: Skin is warm and dry.     Findings: No rash.  Neurological:     Mental Status: He is alert and oriented to person, place, and time.  Psychiatric:        Mood and Affect: Mood normal.        Behavior: Behavior normal.        Assessment & Plan:  Peter Taylor is a 73 y.o. male . Sore throat  Dysphagia, unspecified type Improved, appreciate input and evaluation from ENT.  Reassuring exam at ENT.  Likely reflux and postnasal drip contributors.  Swallow study pending.  Continue trigger avoidance, PPI, and Flonase , Zyrtec  which have been helpful.  Has follow-up with me in 1 month. GI follow-up planned, phone number provided for contact as well as general surgeon follow-up for his thyroid  nodules.  Contact numbers provided.  RTC precautions given.  No orders of the defined types were placed in this encounter.  Patient Instructions  Glad you are doing better- continue flonase , zyrtec , acid blocker meds and avoid thee trigger foods for reflux. Avoid lying down after eating. Keep appointment for the swallow study as planned.   Here is the number for gastroenterology - it looks like they tried to contact you to schedule an appointment a few days ago.  [facility] Gastroenterology [address] [place] [address] Floor  [phone]  Krystal HERO.  Eletha, MD  General  Surgeon [phone]    Signed,   Reyes Pines, MD [facility], [facility] 12/05/23 10:58 AM

## 2023-12-05 NOTE — Patient Instructions (Addendum)
 Glad you are doing better- continue flonase , zyrtec , acid blocker meds and avoid thee trigger foods for reflux. Avoid lying down after eating. Keep appointment for the swallow study as planned.   Here is the number for gastroenterology - it looks like they tried to contact you to schedule an appointment a few days ago.  [facility] Gastroenterology [address] [place] [address]rd Floor  [phone]  Krystal EMERSON Spinner, MD  General Surgeon [phone]

## 2023-12-09 ENCOUNTER — Encounter: Payer: Self-pay | Admitting: Physician Assistant

## 2023-12-09 ENCOUNTER — Telehealth: Payer: Self-pay | Admitting: Physician Assistant

## 2023-12-09 NOTE — Telephone Encounter (Signed)
error 

## 2023-12-23 ENCOUNTER — Ambulatory Visit (HOSPITAL_COMMUNITY)
Admission: RE | Admit: 2023-12-23 | Discharge: 2023-12-23 | Disposition: A | Discharge status: Home / Self Care | Payer: Medicare PPO | Source: Ambulatory Visit | Attending: Otolaryngology

## 2023-12-23 ENCOUNTER — Ambulatory Visit (HOSPITAL_COMMUNITY)
Admission: RE | Admit: 2023-12-23 | Discharge: 2023-12-23 | Disposition: A | Discharge status: Home / Self Care | Payer: Medicare PPO | Source: Ambulatory Visit | Attending: Family Medicine | Admitting: Family Medicine

## 2023-12-23 ENCOUNTER — Ambulatory Visit (HOSPITAL_COMMUNITY)
Admission: RE | Admit: 2023-12-23 | Discharge: 2023-12-23 | Disposition: A | Discharge status: Home / Self Care | Payer: Medicare PPO | Source: Ambulatory Visit | Attending: Otolaryngology | Admitting: Otolaryngology

## 2023-12-23 ENCOUNTER — Encounter (HOSPITAL_COMMUNITY): Payer: Self-pay

## 2023-12-23 DIAGNOSIS — K219 Gastro-esophageal reflux disease without esophagitis: Secondary | ICD-10-CM | POA: Diagnosis not present

## 2023-12-23 DIAGNOSIS — R490 Dysphonia: Secondary | ICD-10-CM | POA: Insufficient documentation

## 2023-12-23 DIAGNOSIS — R1310 Dysphagia, unspecified: Secondary | ICD-10-CM | POA: Insufficient documentation

## 2023-12-23 DIAGNOSIS — K449 Diaphragmatic hernia without obstruction or gangrene: Secondary | ICD-10-CM | POA: Diagnosis not present

## 2023-12-23 NOTE — Progress Notes (Addendum)
 Modified Barium Swallow Study  Patient Details  Name: Peter Taylor MRN: [id number] Date of Birth: 10/10/1951  Today's Date: 12/23/2023  Modified Barium Swallow completed.  Full report located under Chart Review in the Imaging Section.  History of Present Illness Peter Taylor is a 73 yo male presenting for OP MBS after seeing ENT for two year history of throat discomfort and dysphagia. He reports an uncomfortable sensation when swallowing both solids and liquids and an occasional raspy vocal quality. Laryngoscopy was completed with moderate interarytenoid pachydermia and post cricoid edema, which is consistent with GERD LPR per ENT note. Endoscopy 2021 showed normal esophagus, stomach, and duodenum. PMH includes CAD, GERD, HLD, substance abuse, tuberculosis   Clinical Impression Pt exhibits an overall functional oropharyngeal swallow. There is a collection of vallecular and pyriform sinus residue after the initial swallow that pt independently clears with the use of an initiatied subswallow. No penetration or aspiration occurred with any consistency trialed. Throughout the study, he reported the feeling of reflux and a globus sensation. Deferred evaluating pt's swallow with the barium tablet as he was scheduled to complete an esophagram immediately following this MBS. Recommend he continue his regular diet without SLP f/u. Provided education regarding general aspiration and esophageal precautions. Factors that may increase risk of adverse event in presence of aspiration Rubye Oaks & Clearance Coots 2021):    Swallow Evaluation Recommendations Recommendations: PO diet PO Diet Recommendation: Regular;Thin liquids (Level 0) Liquid Administration via: Cup;Straw Medication Administration: Whole meds with liquid Supervision: Patient able to self-feed Swallowing strategies  : Slow rate;Small bites/sips Postural changes: Position pt fully upright for meals;Stay upright 30-60 min after meals Oral care  recommendations: Oral care BID (2x/day)      Gwynneth Aliment, M.A., CF-SLP Speech Language Pathology, Acute Rehabilitation Services  Secure Chat preferred [phone]  12/23/2023,12:27 PM

## 2023-12-30 ENCOUNTER — Other Ambulatory Visit: Payer: Self-pay | Admitting: Surgery

## 2023-12-30 DIAGNOSIS — E041 Nontoxic single thyroid nodule: Secondary | ICD-10-CM

## 2023-12-30 DIAGNOSIS — E049 Nontoxic goiter, unspecified: Secondary | ICD-10-CM | POA: Diagnosis not present

## 2023-12-30 DIAGNOSIS — R1310 Dysphagia, unspecified: Secondary | ICD-10-CM | POA: Diagnosis not present

## 2023-12-30 DIAGNOSIS — E042 Nontoxic multinodular goiter: Secondary | ICD-10-CM | POA: Diagnosis not present

## 2024-01-05 ENCOUNTER — Encounter: Payer: Self-pay | Admitting: Family Medicine

## 2024-01-05 ENCOUNTER — Ambulatory Visit: Payer: Medicare PPO | Admitting: Family Medicine

## 2024-01-05 VITALS — BP 128/80 | HR 64 | Temp 97.9°F | Ht 71.0 in | Wt 187.0 lb

## 2024-01-05 DIAGNOSIS — N400 Enlarged prostate without lower urinary tract symptoms: Secondary | ICD-10-CM | POA: Diagnosis not present

## 2024-01-05 DIAGNOSIS — Z23 Encounter for immunization: Secondary | ICD-10-CM

## 2024-01-05 DIAGNOSIS — E119 Type 2 diabetes mellitus without complications: Secondary | ICD-10-CM

## 2024-01-05 DIAGNOSIS — E041 Nontoxic single thyroid nodule: Secondary | ICD-10-CM | POA: Diagnosis not present

## 2024-01-05 DIAGNOSIS — K219 Gastro-esophageal reflux disease without esophagitis: Secondary | ICD-10-CM | POA: Diagnosis not present

## 2024-01-05 DIAGNOSIS — I25810 Atherosclerosis of coronary artery bypass graft(s) without angina pectoris: Secondary | ICD-10-CM | POA: Diagnosis not present

## 2024-01-05 DIAGNOSIS — E782 Mixed hyperlipidemia: Secondary | ICD-10-CM

## 2024-01-05 LAB — LIPID PANEL
Cholesterol: 130 mg/dL (ref 0–200)
HDL: 46.9 mg/dL (ref >39.00)
LDL Cholesterol: 66 mg/dL (ref 0–99)
NonHDL: 82.95
Total CHOL/HDL Ratio: 3
Triglycerides: 84 mg/dL (ref 0.0–149.0)
VLDL: 16.8 mg/dL (ref 0.0–40.0)

## 2024-01-05 LAB — MICROALBUMIN / CREATININE URINE RATIO
Creatinine,U: 190.9 mg/dL
Microalb Creat Ratio: 4.7 mg/g (ref 0.0–30.0)
Microalb, Ur: 0.9 mg/dL (ref 0.0–1.9)

## 2024-01-05 LAB — COMPREHENSIVE METABOLIC PANEL
ALT: 21 U/L (ref 0–53)
AST: 19 U/L (ref 0–37)
Albumin: 4.2 g/dL (ref 3.5–5.2)
Alkaline Phosphatase: 126 U/L — ABNORMAL HIGH (ref 39–117)
BUN: 17 mg/dL (ref 6–23)
CO2: 27 meq/L (ref 19–32)
Calcium: 10.1 mg/dL (ref 8.4–10.5)
Chloride: 103 meq/L (ref 96–112)
Creatinine, Ser: 1.1 mg/dL (ref 0.40–1.50)
GFR: 68.02 mL/min (ref >60.00)
Glucose, Bld: 139 mg/dL — ABNORMAL HIGH (ref 70–99)
Potassium: 3.8 meq/L (ref 3.5–5.1)
Sodium: 139 meq/L (ref 135–145)
Total Bilirubin: 0.4 mg/dL (ref 0.2–1.2)
Total Protein: 8 g/dL (ref 6.0–8.3)

## 2024-01-05 LAB — HEMOGLOBIN A1C: Hgb A1c MFr Bld: 7.6 % — ABNORMAL HIGH (ref 4.6–6.5)

## 2024-01-05 MED ORDER — PANTOPRAZOLE SODIUM 40 MG PO TBEC
40.0000 mg | DELAYED_RELEASE_TABLET | Freq: Every day | ORAL | 1 refills | Status: DC
Start: 2024-01-05 — End: 2024-06-04

## 2024-01-05 MED ORDER — CLOPIDOGREL BISULFATE 75 MG PO TABS: ORAL_TABLET | ORAL | 1 refills | Status: AC

## 2024-01-05 MED ORDER — TAMSULOSIN HCL 0.4 MG PO CAPS: 0.8000 mg | ORAL_CAPSULE | Freq: Every day | ORAL | 1 refills | Status: AC

## 2024-01-05 MED ORDER — ATORVASTATIN CALCIUM 40 MG PO TABS: ORAL_TABLET | ORAL | 1 refills | Status: DC

## 2024-01-05 NOTE — Progress Notes (Unsigned)
 Subjective:  Patient ID: Peter Taylor, male    DOB: 07/07/1951  Age: 73 y.o. MRN: [id number]  CC:  Chief Complaint  Patient presents with   Medical Management of Chronic Issues    Pt is doing well, no concerns, pt is not fasting     HPI Peter Taylor presents for   Diabetes: Borderline control in September.  Diet/exercise approach.  He is on statin with Lipitor 40 mg daily.  No ACE or ARB.  He does have a history of CAD on Plavix with DES in 2021 to the LAD.  Followed by cardiology. Unknown next appt. Dr. Eldridge Dace. No new CP/DOE. Home readings variable - up to 200's.   Microalbumin: Normal ratio 01/16/2023 Optho, foot exam, pneumovax:  Pneumonia vaccine, Flu vaccine today.  Shingle vaccine - plans to d/w pharmacy.  Optho last year.   Lab Results  Component Value Date   HGBA1C 7.1 (H) 07/07/2023   HGBA1C 7.0 (H) 01/16/2023   HGBA1C 7.1 (H) 01/02/2022   Lab Results  Component Value Date   MICROALBUR 1.1 01/16/2023   LDLCALC 72 07/07/2023   CREATININE 1.15 07/07/2023   Hyperlipidemia: Lipitor 40 mg daily. No new myalgias/side effects.  Lab Results  Component Value Date   CHOL 137 07/07/2023   HDL 48.30 07/07/2023   LDLCALC 72 07/07/2023   TRIG 84.0 07/07/2023   CHOLHDL 3 07/07/2023   Lab Results  Component Value Date   ALT 42 07/07/2023   AST 27 07/07/2023   ALKPHOS 285 (H) 07/28/2023   BILITOT 0.7 07/07/2023    GERD Protonix 40 mg daily.  See prior visits regarding sore throat, ENT evaluation last month.  No significant findings on scope but some findings consistent with GERD, LPR.  Thought to have GERD, postnasal drainage as contributors.  Continued on Protonix, swallow study was ordered and dietary changes to manage reflux.  Swallow study February 25, no treatment recommended or SLP follow-up indicated.  Esophagram with evidence of hernia and retropulsion of barium through the esophagus. Still taking protonix daily. GI appt 3/26.   BPH Treated with  Flomax.  Improved symptoms previously with higher dose of 0.8 mg daily.  He has had a chronic stable PSA elevation followed by urology. Some possible retrograde ejaculation at times. No other side effects or lightheadedness. Chooses to keep meds the same. Working well.   Thyroid nodules Followed by general surgery, Dr. Gerrit Friends, with last office visit reviewed from 12/30/2023.  Prior FNA a year ago [facility] category 2 benign nodules history of dysphagia followed by speech pathology.  TSH and repeat ultrasound ordered.  5-year follow-up protocol.  Ultrasound planned tomorrow.  History Patient Active Problem List   Diagnosis Date Noted   Thoracic back pain 09/12/2023   Gastroesophageal reflux disease without esophagitis 01/16/2023   Type 2 diabetes mellitus without complication, without long-term current use of insulin (HCC) 01/16/2023   Thyroid nodule 01/16/2023   Elevated PSA 01/16/2023   Benign prostatic hyperplasia 01/16/2023   Dysphagia 08/01/2021   Multiple thyroid nodules 08/01/2021   Coronary artery disease    Hyperlipidemia 11/11/2019   Past Medical History:  Diagnosis Date   CAD (coronary artery disease), native coronary artery    a. Cath 12/10/19: 90% mLAD s/p DES, mild non obstructive disease in RCA and Cx   GERD (gastroesophageal reflux disease)    Hyperlipidemia    Substance abuse (HCC)    Tuberculosis    Past Surgical History:  Procedure Laterality Date   CORONARY  STENT INTERVENTION N/A 12/10/2019   Procedure: CORONARY STENT INTERVENTION;  Surgeon: Corky Crafts, MD;  Location: [facility] INVASIVE CV LAB;  Service: Cardiovascular;  Laterality: N/A;   LEFT HEART CATH AND CORONARY ANGIOGRAPHY N/A 12/10/2019   Procedure: LEFT HEART CATH AND CORONARY ANGIOGRAPHY;  Surgeon: Corky Crafts, MD;  Location: [facility] INVASIVE CV LAB;  Service: Cardiovascular;  Laterality: N/A;   NO PAST SURGERIES     Allergies  Allergen Reactions   Penicillins Hives    Did it involve swelling of  the face/tongue/throat, SOB, or low BP? No Did it involve sudden or severe rash/hives, skin peeling, or any reaction on the inside of your mouth or nose? No Did you need to seek medical attention at a hospital or doctor's office? No When did it last happen?      35+ years If all above answers are "NO", may proceed with cephalosporin use.    Prior to Admission medications   Medication Sig Start Date End Date Taking? Authorizing Provider  Accu-Chek Softclix Lancets lancets daily. 08/27/23  Yes [provider]  atorvastatin (LIPITOR) 40 MG tablet TAKE 1 TABLET(40 MG) BY MOUTH DAILY 07/07/23  Yes Shade Flood, MD  Blood Glucose Monitoring Suppl DEVI 1 each by Does not apply route daily in the afternoon. May substitute to any manufacturer covered by patient's insurance. 08/27/23  Yes Shade Flood, MD  cetirizine (ZYRTEC) 10 MG tablet Take 1 tablet (10 mg total) by mouth daily. 12/01/23  Yes Ashok Croon, MD  clopidogrel (PLAVIX) 75 MG tablet TAKE 1 TABLET(75 MG) BY MOUTH DAILY WITH BREAKFAST 07/07/23  Yes Shade Flood, MD  fluticasone [facility]) 50 MCG/ACT nasal spray Place 2 sprays into both nostrils 2 (two) times daily. 12/01/23  Yes Ashok Croon, MD  HYDROcodone-acetaminophen (NORCO/VICODIN) 5-325 MG tablet Take 1 tablet by mouth every 6 (six) hours as needed for severe pain (pain score 7-10). 09/22/23  Yes Shade Flood, MD  pantoprazole (PROTONIX) 40 MG tablet Take 1 tablet (40 mg total) by mouth daily. 07/07/23  Yes Shade Flood, MD  tamsulosin (FLOMAX) 0.4 MG CAPS capsule Take 2 capsules (0.8 mg total) by mouth daily. TAKE 1 CAPSULE(0.4 MG) BY MOUTH DAILY 07/07/23  Yes Shade Flood, MD  tiZANidine (ZANAFLEX) 4 MG tablet Take 1 tablet (4 mg total) by mouth every 8 (eight) hours as needed for muscle spasms. Start once at bedtime. 09/22/23  Yes Shade Flood, MD   Social History   Socioeconomic History   Marital status: Single    Spouse name: Not on file    Number of children: 8   Years of education: Not on file   Highest education level: Not on file  Occupational History   Occupation: truck driver  Tobacco Use   Smoking status: Former   Smokeless tobacco: Former  Building services engineer status: Never Used  Substance and Sexual Activity   Alcohol use: Yes    Comment: occ   Drug use: No   Sexual activity: Not Currently  Other Topics Concern   Not on file  Social History Narrative   Not on file   Social Drivers of Health   Financial Resource Strain: Low Risk  (04/24/2023)   Overall Financial Resource Strain (CARDIA)    Difficulty of Paying Living Expenses: Not hard at all  Food Insecurity: No Food Insecurity (04/24/2023)   Hunger Vital Sign    Worried About Running Out of Food in the Last Year: Never true  Ran Out of Food in the Last Year: Never true  Transportation Needs: No Transportation Needs (04/24/2023)   PRAPARE - Administrator, Civil Service (Medical): No    Lack of Transportation (Non-Medical): No  Physical Activity: Inactive (04/24/2023)   Exercise Vital Sign    Days of Exercise per Week: 0 days    Minutes of Exercise per Session: 0 min  Stress: No Stress Concern Present (04/24/2023)   [employer] of Occupational Health - Occupational Stress Questionnaire    Feeling of Stress : Not at all  Social Connections: Unknown (04/24/2023)   Social Connection and Isolation Panel [NHANES]    Frequency of Communication with Friends and Family: More than three times a week    Frequency of Social Gatherings with Friends and Family: Never    Attends Religious Services: More than 4 times per year    Active Member of [employer] or Organizations: No    Attends Banker Meetings: Never    Marital Status: Not on file  Intimate Partner Violence: Not At Risk (04/24/2023)   Humiliation, Afraid, Rape, and Kick questionnaire    Fear of Current or Ex-Partner: No    Emotionally Abused: No    Physically Abused: No     Sexually Abused: No    Review of Systems  Constitutional:  Negative for fatigue and unexpected weight change.  Eyes:  Negative for visual disturbance.  Respiratory:  Negative for cough, chest tightness and shortness of breath.   Cardiovascular:  Negative for chest pain, palpitations and leg swelling.  Gastrointestinal:  Negative for abdominal pain and blood in stool.  Neurological:  Negative for dizziness, light-headedness and headaches.     Objective:   Vitals:   01/05/24 0820  BP: 128/80  Pulse: 64  Temp: 97.9 F (36.6 C)  TempSrc: Temporal  SpO2: 98%  Weight: 187 lb (84.8 kg)  Height: 5\' 11"  (1.803 m)     Physical Exam Vitals reviewed.  Constitutional:      Appearance: He is well-developed.  HENT:     Head: Normocephalic and atraumatic.  Neck:     Vascular: No carotid bruit or JVD.  Cardiovascular:     Rate and Rhythm: Normal rate and regular rhythm.     Heart sounds: Normal heart sounds. No murmur heard. Pulmonary:     Effort: Pulmonary effort is normal.     Breath sounds: Normal breath sounds. No rales.  Musculoskeletal:     Right lower leg: No edema.     Left lower leg: No edema.  Skin:    General: Skin is warm and dry.  Neurological:     Mental Status: He is alert and oriented to person, place, and time.  Psychiatric:        Mood and Affect: Mood normal.        Assessment & Plan:  Peter Taylor is a 73 y.o. male . Type 2 diabetes mellitus without complication, without long-term current use of insulin (HCC) - Plan: Comprehensive metabolic panel, Hemoglobin A1c, Microalbumin / creatinine urine ratio  -Check A1c, urine microalbumin, no med changes for now.  Adjustment of plan accordingly with lab results.  29-month follow-up if stable  Gastroesophageal reflux disease without esophagitis - Plan: pantoprazole (PROTONIX) 40 MG tablet  -Stable on Protonix.  Follow-up with GI as planned.  Recent swallow study, barium swallow as above.  Mixed  hyperlipidemia - Plan: Comprehensive metabolic panel, Lipid panel  -Lowering Lipitor, continue same dose.  Check labs and  adjust plan accordingly  Benign prostatic hyperplasia, unspecified whether lower urinary tract symptoms present - Plan: tamsulosin (FLOMAX) 0.4 MG CAPS capsule  -Overall stable control with mild side effects as above.  Continue same dose Flomax.  Thyroid nodule  -Followed by general surgery with repeat ultrasound planned tomorrow.  Needs flu shot - Plan: Flu Vaccine Trivalent High Dose (Fluad)  Need for pneumococcal vaccine - Plan: Pneumococcal conjugate vaccine 20-valent   Coronary artery disease involving coronary bypass graft of native heart without angina pectoris - Plan: clopidogrel (PLAVIX) 75 MG tablet, atorvastatin (LIPITOR) 40 MG tablet  -Asymptomatic, denies bleeding, continue Plavix, Lipitor and advised to contact cardiology for follow-up.   Meds ordered this encounter  Medications   clopidogrel (PLAVIX) 75 MG tablet    Sig: TAKE 1 TABLET(75 MG) BY MOUTH DAILY WITH BREAKFAST    Dispense:  90 tablet    Refill:  1   pantoprazole (PROTONIX) 40 MG tablet    Sig: Take 1 tablet (40 mg total) by mouth daily.    Dispense:  90 tablet    Refill:  1   tamsulosin (FLOMAX) 0.4 MG CAPS capsule    Sig: Take 2 capsules (0.8 mg total) by mouth daily. TAKE 1 CAPSULE(0.4 MG) BY MOUTH DAILY    Dispense:  180 capsule    Refill:  1   atorvastatin (LIPITOR) 40 MG tablet    Sig: TAKE 1 TABLET(40 MG) BY MOUTH DAILY    Dispense:  90 tablet    Refill:  1   Patient Instructions  Make sure you schedule eye doctor appointment and cardiology follow up.  No med changes at this time. Take care!    Signed,   Meredith Staggers, MD [facility], [facility] 01/05/24 8:57 AM

## 2024-01-05 NOTE — Patient Instructions (Addendum)
 Make sure you schedule eye doctor appointment and cardiology follow up.  No med changes at this time. Take care!

## 2024-01-06 ENCOUNTER — Ambulatory Visit
Admission: RE | Admit: 2024-01-06 | Discharge: 2024-01-06 | Disposition: A | Discharge status: Home / Self Care | Source: Ambulatory Visit | Attending: Surgery | Admitting: Surgery

## 2024-01-06 ENCOUNTER — Encounter: Payer: Self-pay | Admitting: Family Medicine

## 2024-01-06 DIAGNOSIS — E119 Type 2 diabetes mellitus without complications: Secondary | ICD-10-CM

## 2024-01-06 DIAGNOSIS — E041 Nontoxic single thyroid nodule: Secondary | ICD-10-CM

## 2024-01-12 NOTE — Telephone Encounter (Signed)
-----   Message from Shade Flood sent at 01/11/2024  5:55 PM EDT ----- Results sent by MyChart, but appears patient has not yet reviewed those results.  Please call and make sure they have either seen note or discuss result note.  Thanks.

## 2024-01-12 NOTE — Telephone Encounter (Signed)
 Lab results have been discussed.   Verbalized understanding? Yes  Are there any questions? No    Patient states he is okay with starting that metformin.

## 2024-01-13 MED ORDER — METFORMIN HCL 500 MG PO TABS: 500.0000 mg | ORAL_TABLET | Freq: Every day | ORAL | 1 refills | Status: DC

## 2024-01-21 ENCOUNTER — Encounter: Payer: Self-pay | Admitting: Physician Assistant

## 2024-01-21 ENCOUNTER — Ambulatory Visit: Payer: Medicare PPO | Admitting: Physician Assistant

## 2024-01-21 ENCOUNTER — Telehealth: Payer: Self-pay | Admitting: *Deleted

## 2024-01-21 VITALS — BP 106/68 | HR 67 | Ht 71.0 in | Wt 186.4 lb

## 2024-01-21 DIAGNOSIS — R09A2 Foreign body sensation, throat: Secondary | ICD-10-CM | POA: Diagnosis not present

## 2024-01-21 DIAGNOSIS — R1310 Dysphagia, unspecified: Secondary | ICD-10-CM | POA: Diagnosis not present

## 2024-01-21 DIAGNOSIS — J029 Acute pharyngitis, unspecified: Secondary | ICD-10-CM

## 2024-01-21 DIAGNOSIS — K219 Gastro-esophageal reflux disease without esophagitis: Secondary | ICD-10-CM | POA: Diagnosis not present

## 2024-01-21 NOTE — Telephone Encounter (Signed)
  Peter Taylor 10-Apr-1951 [id number]    Dear Tinnie Gens Greene,MD:  We have scheduled the above named patient for a(n) Upper Endoscopy procedure. Our records show that he is on anticoagulation therapy.  Please advise as to whether the patient may come off their therapy of Plavix  5 days prior to their procedure which is scheduled for 02/18/2024.  Please route your response to Merri Ray CMA or fax response to [phone].  Sincerely,     Gastroenterology

## 2024-01-21 NOTE — Patient Instructions (Signed)
 Continue Pantoprazole 40 mg daily  You will be contacted by our office prior to your procedure for directions on holding your Plavix.  If you do not hear from our office 1 week prior to your scheduled procedure, please call [phone] to discuss.    Follow up per recommendations after your procedure  Due to recent changes in healthcare laws, you may see the results of your imaging and laboratory studies on MyChart before your provider has had a chance to review them.  We understand that in some cases there may be results that are confusing or concerning to you. Not all laboratory results come back in the same time frame and the provider may be waiting for multiple results in order to interpret others.  Please give [place] 48 hours in order for your provider to thoroughly review all the results before contacting the office for clarification of your results.    You have been scheduled for an endoscopy. Please follow written instructions given to you at your visit today.  If you use inhalers (even only as needed), please bring them with you on the day of your procedure.  If you take any of the following medications, they will need to be adjusted prior to your procedure:   DO NOT TAKE 7 DAYS PRIOR TO TEST- Trulicity (dulaglutide) Ozempic, Wegovy (semaglutide) Mounjaro (tirzepatide) Bydureon Bcise (exanatide extended release)  DO NOT TAKE 1 DAY PRIOR TO YOUR TEST Rybelsus (semaglutide) Adlyxin (lixisenatide) Victoza (liraglutide) Byetta (exanatide) ___________________________________________________________________________    I appreciate the  opportunity to care for you  Thank You   Jacelyn Grip

## 2024-01-21 NOTE — Progress Notes (Signed)
 Chief Complaint: GERD  HPI:  Peter Taylor is a  73 y/o male, known to Dr. Marina Goodell with a past medical history as listed below including CAD status post stent placement 12/10/2019 on Plavix, who was referred to me by Shade Flood, MD for a complaint of GERD.      11/25/2019 office visit with Dr. Marina Goodell for GERD, abdominal pain, minor rectal bleeding and need for colonoscopy.  Also some postprandial bloating.  At that time continued on a PPI and scheduled for a colonoscopy and EGD.    12/07/2019 colonoscopy with one 1 mm polyp in the ascending colon and diverticulosis in the entire colon as well as internal hemorrhoids.  Repeat recommended in 7 to 10 years.    12/07/2019 EGD for dyspepsia and abdominal bloating was normal.  Patient scheduled for abdominal ultrasound to evaluate dyspepsia and bloating discomfort.    12/14/2019 abdominal ultrasound with fatty liver and otherwise normal.    12/23/2023 esophagram done for dysphagia with evidence of hernia and retropulsion of barium through the esophagus.    01/05/2024 hemoglobin A1c 7.6, CMP with a glucose of 139 and alk phos of 126 (perpetually elevated).    01/05/2024 patient saw PCP and at that time on Protonix 40 mg daily and had been seen multiple times for sore throat.  Had an ENT evaluation the month before with no significant findings other than reflux and LPR.  His nasal drainage is contributing.  Patient had a modified barium swallow in February no treatment recommended or SLP follow-up indicated.    Today, the patient presents to clinic and tells me that he has had issues with feeling like something is in his throat or that things are getting stuck for the past year at least.  Tells me that sometimes he feels like he swallows and he feels something kind of come up on the edge of his esophagus on the sides.  Notes that if he drinks something bubbly like Sprite or caffeinated it almost hurts to swallow it down.  Also tells me when he is eating things get  stuck and occasionally he has had to vomit or regurgitate in order to get these out.  He has found that if he drinks some water with eating in between bites this seems to help things to go down.  He is on Pantoprazole 40 mg daily.    Denies fever, chills, weight loss, abdominal pain, nausea or vomiting.  Past Medical History:  Diagnosis Date   CAD (coronary artery disease), native coronary artery    a. Cath 12/10/19: 90% mLAD s/p DES, mild non obstructive disease in RCA and Cx   GERD (gastroesophageal reflux disease)    Hyperlipidemia    Substance abuse (HCC)    Tuberculosis     Past Surgical History:  Procedure Laterality Date   CORONARY STENT INTERVENTION N/A 12/10/2019   Procedure: CORONARY STENT INTERVENTION;  Surgeon: Corky Crafts, MD;  Location: [facility] INVASIVE CV LAB;  Service: Cardiovascular;  Laterality: N/A;   LEFT HEART CATH AND CORONARY ANGIOGRAPHY N/A 12/10/2019   Procedure: LEFT HEART CATH AND CORONARY ANGIOGRAPHY;  Surgeon: Corky Crafts, MD;  Location: [facility] INVASIVE CV LAB;  Service: Cardiovascular;  Laterality: N/A;   NO PAST SURGERIES      Current Outpatient Medications  Medication Sig Dispense Refill   Accu-Chek Softclix Lancets lancets daily.     atorvastatin (LIPITOR) 40 MG tablet TAKE 1 TABLET(40 MG) BY MOUTH DAILY 90 tablet 1   Blood Glucose  Monitoring Suppl DEVI 1 each by Does not apply route daily in the afternoon. May substitute to any manufacturer covered by patient's insurance. 1 each 0   cetirizine (ZYRTEC) 10 MG tablet Take 1 tablet (10 mg total) by mouth daily. 30 tablet 11   clopidogrel (PLAVIX) 75 MG tablet TAKE 1 TABLET(75 MG) BY MOUTH DAILY WITH BREAKFAST 90 tablet 1   fluticasone (FLONASE) 50 MCG/ACT nasal spray Place 2 sprays into both nostrils 2 (two) times daily. 16 g 6   metFORMIN (GLUCOPHAGE) 500 MG tablet Take 1 tablet (500 mg total) by mouth daily with breakfast. 90 tablet 1   pantoprazole (PROTONIX) 40 MG tablet Take 1 tablet (40 mg  total) by mouth daily. 90 tablet 1   tamsulosin (FLOMAX) 0.4 MG CAPS capsule Take 2 capsules (0.8 mg total) by mouth daily. TAKE 1 CAPSULE(0.4 MG) BY MOUTH DAILY 180 capsule 1   tiZANidine (ZANAFLEX) 4 MG tablet Take 1 tablet (4 mg total) by mouth every 8 (eight) hours as needed for muscle spasms. Start once at bedtime. 30 tablet 0   No current facility-administered medications for this visit.    Allergies as of 01/21/2024 - Review Complete 01/05/2024  Allergen Reaction Noted   Penicillins Hives 07/17/2013    Family History  Problem Relation Age of Onset   Cancer Mother        unsure type   Cancer Brother        pancreatic   Ovarian cancer Sister    Ovarian cancer Sister    Ovarian cancer Sister     Social History   Socioeconomic History   Marital status: Single    Spouse name: Not on file   Number of children: 8   Years of education: Not on file   Highest education level: Not on file  Occupational History   Occupation: truck driver  Tobacco Use   Smoking status: Former   Smokeless tobacco: Former  Building services engineer status: Never Used  Substance and Sexual Activity   Alcohol use: Yes    Comment: occ   Drug use: No   Sexual activity: Not Currently  Other Topics Concern   Not on file  Social History Narrative   Not on file   Social Drivers of Health   Financial Resource Strain: Low Risk  (04/24/2023)   Overall Financial Resource Strain (CARDIA)    Difficulty of Paying Living Expenses: Not hard at all  Food Insecurity: No Food Insecurity (04/24/2023)   Hunger Vital Sign    Worried About Running Out of Food in the Last Year: Never true    Ran Out of Food in the Last Year: Never true  Transportation Needs: No Transportation Needs (04/24/2023)   PRAPARE - Administrator, Civil Service (Medical): No    Lack of Transportation (Non-Medical): No  Physical Activity: Inactive (04/24/2023)   Exercise Vital Sign    Days of Exercise per Week: 0 days     Minutes of Exercise per Session: 0 min  Stress: No Stress Concern Present (04/24/2023)   Peter Taylor    Feeling of Stress : Not at all  Social Connections: Unknown (04/24/2023)   Social Connection and Isolation Panel [NHANES]    Frequency of Communication with Friends and Family: More than three times a week    Frequency of Social Gatherings with Friends and Family: Never    Attends Religious Services: More than 4 times per year  Active Member of Clubs or Organizations: No    Attends Banker Meetings: Never    Marital Status: Not on file  Intimate Partner Violence: Not At Risk (04/24/2023)   Humiliation, Afraid, Rape, and Kick Taylor    Fear of Current or Ex-Partner: No    Emotionally Abused: No    Physically Abused: No    Sexually Abused: No    Review of Systems:    Constitutional: No weight loss, fever or chills HEENT: No cough Cardiovascular: No chest pain Respiratory: No SOB  Gastrointestinal: See HPI and otherwise negative Genitourinary: No dysuria Neurological: No headache, dizziness or syncope Musculoskeletal: No new muscle or joint pain Hematologic: No bleeding  Psychiatric: No history of depression or anxiety   Physical Exam:  Vital signs: BP 106/68   Pulse 67   Ht 5\' 11"  (1.803 m)   Wt 186 lb 6.4 oz (84.6 kg)   BMI 26.00 kg/m   Constitutional:   Pleasant AA male appears to be in NAD, Well developed, Well nourished, alert and cooperative Head:  Normocephalic and atraumatic. Eyes:   PEERL, EOMI. No icterus. Conjunctiva pink. Ears:  Normal auditory acuity. Neck:  Supple Throat: Oral cavity and pharynx without inflammation, swelling or lesion.  Respiratory: Respirations even and unlabored. Lungs clear to auscultation bilaterally.   No wheezes, crackles, or rhonchi.  Cardiovascular: Normal S1, S2. No MRG. Regular rate and rhythm. No peripheral edema, cyanosis or pallor.   Gastrointestinal:  Soft, nondistended, nontender. No rebound or guarding. Normal bowel sounds. No appreciable masses or hepatomegaly. Rectal:  Not performed.  Msk:  Symmetrical without gross deformities. Without edema, no deformity or joint abnormality.  Neurologic:  Alert and  oriented x4;  grossly normal neurologically.  Skin:   Dry and intact without significant lesions or rashes. Psychiatric: Demonstrates good judgement and reason without abnormal affect or behaviors.  RELEVANT LABS AND IMAGING: CBC    Component Value Date/Time   WBC 6.3 03/17/2023 1156   RBC 4.63 03/17/2023 1156   HGB 13.7 03/17/2023 1156   HGB 14.5 04/09/2021 1019   HCT 40.4 03/17/2023 1156   HCT 43.8 04/09/2021 1019   PLT 231.0 03/17/2023 1156   PLT 309 04/09/2021 1019   MCV 87.4 03/17/2023 1156   MCV 86 04/09/2021 1019   MCH 28.4 04/09/2021 1019   MCH 28.5 12/22/2019 1611   MCHC 33.9 03/17/2023 1156   RDW 15.1 03/17/2023 1156   RDW 14.4 04/09/2021 1019   LYMPHSABS 1.9 01/02/2022 1101   MONOABS 0.3 01/02/2022 1101   EOSABS 0.3 01/02/2022 1101   BASOSABS 0.0 01/02/2022 1101    CMP     Component Value Date/Time   NA 139 01/05/2024 0912   NA 142 01/17/2021 1006   K 3.8 01/05/2024 0912   CL 103 01/05/2024 0912   CO2 27 01/05/2024 0912   GLUCOSE 139 (H) 01/05/2024 0912   BUN 17 01/05/2024 0912   BUN 16 01/17/2021 1006   CREATININE 1.10 01/05/2024 0912   CALCIUM 10.1 01/05/2024 0912   PROT 8.0 01/05/2024 0912   PROT 7.5 04/09/2021 1019   ALBUMIN 4.2 01/05/2024 0912   ALBUMIN 4.7 04/09/2021 1019   AST 19 01/05/2024 0912   ALT 21 01/05/2024 0912   ALKPHOS 126 (H) 01/05/2024 0912   BILITOT 0.4 01/05/2024 0912   BILITOT 0.5 04/09/2021 1019   GFRNONAA 62 07/19/2020 0954   GFRAA 72 07/19/2020 0954    Assessment: 1.  Dysphagia/globus sensation/sore throat: Patient has had barium esophagram, modified  barium swallow with speech pathology and evaluation by ENT for this with no real cause seen, has  been found to have reflux; consider esophageal stricture versus esophagitis versus other  Plan: 1.  Scheduled patient for diagnostic EGD with dilation in the [facility] with Dr. Marina Goodell.  Did provide the patient a detailed list of risk for the  procedure and he agrees to proceed. Patient is appropriate for endoscopic procedure(s) in the ambulatory ([facility]) setting.  2.  Patient to hold his Plavix for 5 days prior to time of procedure.  We will communicate with Dr. Chilton Si to ensure this is acceptable for him. 3.  For now continue Pantoprazole 40 mg daily, may benefit from Carafate in the future for sore throat complaints, can wait until after time of EGD. 4.  Reviewed anti-dysphagia measures. 5.  Patient to follow in clinic per recommendations from Dr. Marina Goodell after time of procedure  Hyacinth Meeker, PA-C [facility] 01/21/2024, 10:57 AM  Cc: Shade Flood, MD

## 2024-01-21 NOTE — Progress Notes (Signed)
 Noted.

## 2024-01-22 NOTE — Telephone Encounter (Signed)
 Noted.  Patient has history of CAD, followed by cardiology with visit in April last year, plan for 1 year follow-up.  I will forward this request to cardiology provider from last year's visit for any specifics regarding Plavix hold for planned procedure.  Thanks.

## 2024-01-23 ENCOUNTER — Telehealth: Payer: Self-pay

## 2024-01-23 NOTE — Telephone Encounter (Signed)
 I have put this in the the surgical clearance proper format and routed it to the pre-op pool to advise. See clearance 02/18/24 note.

## 2024-01-23 NOTE — Telephone Encounter (Signed)
   Name: Peter Taylor  DOB: 06/23/1951  MRN: [id number]  Primary Cardiologist: Lance Muss, MD   Preoperative team, please contact this patient and set up a phone call appointment for further preoperative risk assessment. Please obtain consent and complete medication review. Thank you for your help.  I confirm that guidance regarding antiplatelet and oral anticoagulation therapy has been completed and, if necessary, noted below.  If necessary his Plavix may be held for 5 days prior to his procedure.  He should take 81 mg aspirin throughout the perioperative period.  Please resume Plavix as soon as hemostasis is achieved.  Once his Plavix is resumed his aspirin may be discontinued.  I also confirmed the patient resides in the state of [place]. As per [facility] Medical Board telemedicine laws, the patient must reside in the state in which the provider is licensed.   Ronney Asters, NP 01/23/2024, 8:04 AM [facility]

## 2024-01-23 NOTE — Telephone Encounter (Signed)
   Pre-operative Risk Assessment    Patient Name: Peter Taylor  DOB: 06-Dec-1950 MRN: [id number]   Date of last office visit: 02/17/23 WITH EMILY MONGE, NP Date of next office visit: NONE   Request for Surgical Clearance    Procedure:   UPPER ENDOSCOPY  Date of Surgery:  Clearance 02/18/24                                Surgeon:  DR. Meredith Staggers Surgeon's Group or Practice Name:  [facility] Phone number:  [phone] Fax number:  [phone]   Type of Clearance Requested:   - Pharmacy:  Hold Clopidogrel (Plavix) 5 DAYS PRIOR   Type of Anesthesia:  Not Indicated   Additional requests/questions:    SignedMichaelle Copas   01/23/2024, 7:43 AM

## 2024-01-23 NOTE — Telephone Encounter (Signed)
I left a message for the patient to call our office to schedule a tele visit for pre-op clearance.  

## 2024-01-25 ENCOUNTER — Emergency Department (HOSPITAL_COMMUNITY)
Admission: EM | Admit: 2024-01-25 | Discharge: 2024-01-25 | Disposition: A | Discharge status: Home / Self Care | Attending: Emergency Medicine | Admitting: Emergency Medicine

## 2024-01-25 ENCOUNTER — Encounter (HOSPITAL_COMMUNITY): Payer: Self-pay | Admitting: Emergency Medicine

## 2024-01-25 DIAGNOSIS — K629 Disease of anus and rectum, unspecified: Secondary | ICD-10-CM

## 2024-01-25 DIAGNOSIS — N4889 Other specified disorders of penis: Secondary | ICD-10-CM | POA: Insufficient documentation

## 2024-01-25 DIAGNOSIS — K6289 Other specified diseases of anus and rectum: Secondary | ICD-10-CM | POA: Diagnosis not present

## 2024-01-25 DIAGNOSIS — Z7902 Long term (current) use of antithrombotics/antiplatelets: Secondary | ICD-10-CM | POA: Diagnosis not present

## 2024-01-25 DIAGNOSIS — N489 Disorder of penis, unspecified: Secondary | ICD-10-CM

## 2024-01-25 LAB — CBG MONITORING, ED: Glucose-Capillary: 109 mg/dL — ABNORMAL HIGH (ref 70–99)

## 2024-01-25 MED ORDER — CEFTRIAXONE SODIUM 500 MG IJ SOLR
500.0000 mg | Freq: Once | INTRAMUSCULAR | Status: AC
  Administered 2024-01-25: 500 mg via INTRAMUSCULAR
  Filled 2024-01-25: qty 500

## 2024-01-25 MED ORDER — DOXYCYCLINE HYCLATE 100 MG PO TABS
100.0000 mg | ORAL_TABLET | Freq: Once | ORAL | Status: AC
  Administered 2024-01-25: 100 mg via ORAL
  Filled 2024-01-25: qty 1

## 2024-01-25 MED ORDER — DOXYCYCLINE HYCLATE 100 MG PO CAPS: 100.0000 mg | ORAL_CAPSULE | Freq: Two times a day (BID) | ORAL | 0 refills | Status: DC

## 2024-01-25 MED ORDER — STERILE WATER FOR INJECTION IJ SOLN
INTRAMUSCULAR | Status: AC
  Filled 2024-01-25: qty 10

## 2024-01-25 NOTE — Discharge Instructions (Signed)
 Please follow up with your family doc and ID in the office.  Take the antibiotics as prescribed.

## 2024-01-25 NOTE — ED Provider Notes (Signed)
 [place] EMERGENCY DEPARTMENT AT [facility] Provider Note   CSN: [id number] Arrival date & time: 01/25/24  2023     History  Chief Complaint  Patient presents with   Abscess    Peter Taylor is a 73 y.o. male.  30 yoM with a chief complaints of rash to his rectum and to his foreskin.  He said this been going on for about 3 weeks.  Has been draining some foul-smelling liquid.  He has been using diaper wipes to try and help with this.  He works as a IT trainer.  Family got a chance to have this looked at.  Denies any pain with urination.  Denies any abdominal pain denies nausea or vomiting.  Has had chronic loose stools with his diabetes medications does not feel like that has not changed.  He denies recent weight loss night sweats.   Abscess      Home Medications Prior to Admission medications   Medication Sig Start Date End Date Taking? Authorizing Provider  doxycycline (VIBRAMYCIN) 100 MG capsule Take 1 capsule (100 mg total) by mouth 2 (two) times daily. One po bid x 7 days 01/25/24  Yes Melene Plan, DO  Accu-Chek Softclix Lancets lancets daily. 08/27/23   [provider]  atorvastatin (LIPITOR) 40 MG tablet TAKE 1 TABLET(40 MG) BY MOUTH DAILY 01/05/24   Shade Flood, MD  Blood Glucose Monitoring Suppl DEVI 1 each by Does not apply route daily in the afternoon. May substitute to any manufacturer covered by patient's insurance. 08/27/23   Shade Flood, MD  cetirizine (ZYRTEC) 10 MG tablet Take 1 tablet (10 mg total) by mouth daily. 12/01/23   Ashok Croon, MD  clopidogrel (PLAVIX) 75 MG tablet TAKE 1 TABLET(75 MG) BY MOUTH DAILY WITH BREAKFAST 01/05/24   Shade Flood, MD  fluticasone [facility]) 50 MCG/ACT nasal spray Place 2 sprays into both nostrils 2 (two) times daily. 12/01/23   Ashok Croon, MD  metFORMIN (GLUCOPHAGE) 500 MG tablet Take 1 tablet (500 mg total) by mouth daily with breakfast. Patient not taking: Reported on 01/21/2024 01/13/24    Shade Flood, MD  pantoprazole (PROTONIX) 40 MG tablet Take 1 tablet (40 mg total) by mouth daily. 01/05/24   Shade Flood, MD  tamsulosin (FLOMAX) 0.4 MG CAPS capsule Take 2 capsules (0.8 mg total) by mouth daily. TAKE 1 CAPSULE(0.4 MG) BY MOUTH DAILY 01/05/24   Shade Flood, MD  tiZANidine (ZANAFLEX) 4 MG tablet Take 1 tablet (4 mg total) by mouth every 8 (eight) hours as needed for muscle spasms. Start once at bedtime. 09/22/23   Shade Flood, MD      Allergies    Penicillins    Review of Systems   Review of Systems  Physical Exam Updated Vital Signs BP 130/79   Pulse (!) 58   Temp 98.2 F (36.8 C)   Resp 16   SpO2 99%  Physical Exam Vitals and nursing note reviewed.  Constitutional:      Appearance: He is well-developed.  HENT:     Head: Normocephalic and atraumatic.  Eyes:     Pupils: Pupils are equal, round, and reactive to light.  Neck:     Vascular: No JVD.  Cardiovascular:     Rate and Rhythm: Normal rate and regular rhythm.     Heart sounds: No murmur heard.    No friction rub. No gallop.  Pulmonary:     Effort: No respiratory distress.  Breath sounds: No wheezing.  Abdominal:     General: There is no distension.     Tenderness: There is no abdominal tenderness. There is no guarding or rebound.  Genitourinary:    Comments: There is some heaped up lesions and some purulent appearing discharge along the foreskin.  He has a phimosis and it is difficult to visualize the head of the penis.  He also has a similar appearing lesions about the rectum.  He has 2 of them, also heaped up lesions with some drainage. Musculoskeletal:        General: Normal range of motion.     Cervical back: Normal range of motion and neck supple.  Skin:    Coloration: Skin is not pale.     Findings: No rash.  Neurological:     Mental Status: He is alert and oriented to person, place, and time.  Psychiatric:        Behavior: Behavior normal.     ED Results /  Procedures / Treatments   Labs (all labs ordered are listed, but only abnormal results are displayed) Labs Reviewed  CBG MONITORING, ED - Abnormal; Notable for the following components:      Result Value   Glucose-Capillary 109 (*)    All other components within normal limits  RPR  HIV ANTIBODY (ROUTINE TESTING W REFLEX)  GC/CHLAMYDIA PROBE AMP ([place]) NOT AT [facility]    EKG None  Radiology No results found.  Procedures Procedures    Medications Ordered in ED Medications  sterile water (preservative free) injection (has no administration in time range)  cefTRIAXone (ROCEPHIN) injection 500 mg (500 mg Intramuscular Given 01/25/24 2309)  doxycycline (VIBRA-TABS) tablet 100 mg (100 mg Oral Given 01/25/24 2308)    ED Course/ Medical Decision Making/ A&P                                 Medical Decision Making Amount and/or Complexity of Data Reviewed Labs: ordered.  Risk Prescription drug management.   73 yo M with a chief complaints of lesions along the foreskin and along his rectum.  He noticed this about 3 weeks ago.  It looks almost like condyloma but has some purulent appearing drainage coming from it.  He denies any anal receptive intercourse.  I will test him for STDs here.  Start him on treatment for gonorrhea and chlamydia.  Will give him information to follow-up with infectious disease.  11:26 PM:  I have discussed the diagnosis/risks/treatment options with the patient.  Evaluation and diagnostic testing in the emergency department does not suggest an emergent condition requiring admission or immediate intervention beyond what has been performed at this time.  They will follow up with PCP. We also discussed returning to the ED immediately if new or worsening sx occur. We discussed the sx which are most concerning (e.g., sudden worsening pain, fever, inability to tolerate by mouth) that necessitate immediate return. Medications administered to the patient during  their visit and any new prescriptions provided to the patient are listed below.  Medications given during this visit Medications  sterile water (preservative free) injection (has no administration in time range)  cefTRIAXone (ROCEPHIN) injection 500 mg (500 mg Intramuscular Given 01/25/24 2309)  doxycycline (VIBRA-TABS) tablet 100 mg (100 mg Oral Given 01/25/24 2308)     The patient appears reasonably screen and/or stabilized for discharge and I doubt any other medical condition or other [facility]  requiring further screening, evaluation, or treatment in the ED at this time prior to discharge.          Final Clinical Impression(s) / ED Diagnoses Final diagnoses:  Rectal lesion  Penile lesion    Rx / DC Orders ED Discharge Orders          Ordered    doxycycline (VIBRAMYCIN) 100 MG capsule  2 times daily        01/25/24 2149              Melene Plan, DO 01/25/24 2326

## 2024-01-25 NOTE — ED Triage Notes (Addendum)
 Pt here from home with c/o non healing abscess on his buttock states that the area drains but come back , pt also has a wound / odor on his penis started 3 weeks ago

## 2024-01-26 LAB — RPR
RPR Ser Ql: REACTIVE — AB
RPR Titer: 1:64 {titer}

## 2024-01-26 LAB — HIV ANTIBODY (ROUTINE TESTING W REFLEX): HIV Screen 4th Generation wRfx: NONREACTIVE

## 2024-01-27 ENCOUNTER — Encounter: Payer: Self-pay | Admitting: Family Medicine

## 2024-01-27 LAB — GC/CHLAMYDIA PROBE AMP (~~LOC~~) NOT AT ARMC
Chlamydia: NEGATIVE
Comment: NEGATIVE
Comment: NORMAL
Neisseria Gonorrhea: NEGATIVE

## 2024-01-27 LAB — T.PALLIDUM AB, TOTAL: T Pallidum Abs: REACTIVE — AB

## 2024-01-27 NOTE — Telephone Encounter (Signed)
Lvm to call back to make appointment.

## 2024-01-28 NOTE — Telephone Encounter (Signed)
 3rd attempt will update surgeons office, patient needs to call to schedule appointment. Will remove from pre-op call back at this time.

## 2024-02-02 ENCOUNTER — Ambulatory Visit
Admission: RE | Admit: 2024-02-02 | Discharge: 2024-02-02 | Disposition: A | Discharge status: Home / Self Care | Source: Ambulatory Visit | Attending: Family Medicine | Admitting: Family Medicine

## 2024-02-02 DIAGNOSIS — K760 Fatty (change of) liver, not elsewhere classified: Secondary | ICD-10-CM | POA: Diagnosis not present

## 2024-02-02 DIAGNOSIS — K769 Liver disease, unspecified: Secondary | ICD-10-CM

## 2024-02-03 ENCOUNTER — Encounter: Payer: Self-pay | Admitting: Family Medicine

## 2024-02-10 ENCOUNTER — Other Ambulatory Visit (HOSPITAL_COMMUNITY): Payer: Self-pay

## 2024-02-10 ENCOUNTER — Encounter: Payer: Self-pay | Admitting: Internal Medicine

## 2024-02-10 NOTE — Telephone Encounter (Signed)
-----   Message from Benjiman Bras sent at 02/09/2024  8:34 PM EDT ----- Results sent by MyChart, but appears patient has not yet reviewed those results.  Please call and make sure they have either seen note or discuss result note.  Thanks.

## 2024-02-10 NOTE — Telephone Encounter (Signed)
 Lab results have been discussed.   Verbalized understanding? Yes  Are there any questions? No

## 2024-02-16 ENCOUNTER — Encounter (HOSPITAL_BASED_OUTPATIENT_CLINIC_OR_DEPARTMENT_OTHER): Payer: Self-pay | Admitting: Nurse Practitioner

## 2024-02-16 ENCOUNTER — Ambulatory Visit (HOSPITAL_BASED_OUTPATIENT_CLINIC_OR_DEPARTMENT_OTHER): Admitting: Nurse Practitioner

## 2024-02-16 DIAGNOSIS — Z01810 Encounter for preprocedural cardiovascular examination: Secondary | ICD-10-CM | POA: Diagnosis not present

## 2024-02-16 DIAGNOSIS — K219 Gastro-esophageal reflux disease without esophagitis: Secondary | ICD-10-CM

## 2024-02-16 DIAGNOSIS — E785 Hyperlipidemia, unspecified: Secondary | ICD-10-CM | POA: Diagnosis not present

## 2024-02-16 DIAGNOSIS — E119 Type 2 diabetes mellitus without complications: Secondary | ICD-10-CM

## 2024-02-16 DIAGNOSIS — I2510 Atherosclerotic heart disease of native coronary artery without angina pectoris: Secondary | ICD-10-CM | POA: Diagnosis not present

## 2024-02-16 NOTE — Progress Notes (Signed)
 Cardiology Office Note:  .   Date:  02/16/2024  ID:  Peter Taylor, DOB 04/30/1951, MRN [id number] PCP: Benjiman Bras, MD  [facility] Providers Cardiologist:  Maudine Sos, MD    Patient Profile: .      PMH Coronary artery disease LHC 12/19/2019 90% mLAD s/p DES, mild nonobstructive disease in RCA and Cx Hyperlipidemia Type 2 diabetes BPH GERD  Referred to cardiology and seen by Dr. Jacquelynn Matter 11/11/2019 for chest pain.  He reported several weeks of chest pain that had been progressively getting worse.  On one occasion he was awakened from sleep by pain.  He took a chewable acid reducer and had temporary relief.  Working as a [employer].  He had abnormal CT which led to cardiac catheterization on 12/19/2019 with results as outlined above.  He was not able to participate in cardiac rehab have due to his schedule.  He continued to have frequent belching on Protonix .  He had a chronic headache which resolved when he stopped Imdur .  Aspirin  was discontinued by Dr. Jacquelynn Matter 12/2020.   Last cardiology clinic visit was 02/17/2023 with Marlana Silvan, NP at which time he did not have any concerning cardiac symptoms.  DOT form was completed in the office and one year follow-up was advised.       History of Present Illness: .    History of Present Illness Peter Taylor is a very pleasant 73 y.o. male who is here today for preoperative cardiac evaluation for endoscopy. He reports he is feeling well. 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 Admits he often sleeps too long during the day. He reports occasional chest discomfort associated with belching, which has improved since last office visit. He denies chest pain, shortness of breath, palpitations, orthopnea, PND, presyncope, syncope. Has occasional dizziness with rapid movements.  Was started on metformin  about 2 to 3 months ago for diabetes.  He is tolerating it well; it does cause mild GI upset. He reports a sedentary lifestyle due to  his night shift work schedule, but has been making efforts to incorporate walking into his daily routine. He also reports a diet that includes frequent restaurant meals, but he does like vegetables and salads.   Discussed the use of AI scribe software for clinical note transcription with the patient, who gave verbal consent to proceed.   ROS: See HPI       Studies Reviewed: Aaron Aas   EKG Interpretation Date/Time:  Monday February 16 2024 09:05:10 EDT Ventricular Rate:  62 PR Interval:  198 QRS Duration:  88 QT Interval:  414 QTC Calculation: 420 R Axis:   62  Text Interpretation: Normal sinus rhythm Normal ECG When compared with ECG of 10-Dec-2019 11:47, No significant change was found Confirmed by Slater Duncan [phone]) on 02/16/2024 9:07:16 AM     No results found for: "LIPOA"   Risk Assessment/Calculations:             Physical Exam:   VS:  BP 120/80 (BP Location: Left Arm, Patient Position: Sitting, Cuff Size: Normal)   Pulse (!) 58   Ht 5\' 11"  (1.803 m)   Wt 185 lb 12.8 oz (84.3 kg)   SpO2 99%   BMI 25.91 kg/m    Wt Readings from Last 3 Encounters:  02/16/24 185 lb 12.8 oz (84.3 kg)  01/21/24 186 lb 6.4 oz (84.6 kg)  01/05/24 187 lb (84.8 kg)    GEN: Well nourished, well developed in no acute distress  NECK: No JVD; No carotid bruits CARDIAC: RRR, no murmurs, rubs, gallops RESPIRATORY:  Clear to auscultation without rales, wheezing or rhonchi  ABDOMEN: Soft, non-tender, non-distended EXTREMITIES:  No edema; No deformity     ASSESSMENT AND PLAN: .    Assessment and Plan Assessment & Plan Preoperative Cardiac Evaluation According to the Revised Cardiac Risk Index (RCRI), his Perioperative Risk of Major Cardiac Event is (%): 0.9. His Functional Capacity in METs is: 7.59 according to the Duke Activity Status Index (DASI). The patient is doing well from a cardiac perspective. Therefore, based on ACC/AHA guidelines, the patient would be at acceptable risk for the planned  procedure without further cardiovascular testing. He may hold Plavix  for 5-7 days prior to procedure and should resume as soon as hemodynamically stable postop.  I will forward clearance to requesting provider.   Coronary Artery Disease (CAD)   History of DES to mid LAD 11/2019. He has occasional chest discomfort which is relieved with belching. He denies chest pain, dyspnea, or other symptoms concerning for angina. EKG today is unremarkable. No indication for further ischemic evaluation at this time. Consider a stress test if symptoms worsen. Encouraged him to focus on secondary prevention including heart healthy mostly plant based diet avoiding saturated fat, processed foods, simple carbohydrates, and sugar along with aiming for at least 150 minutes of moderate intensity exercise each week. DOT paperwork completed. No bleeding concerns.  Continue Plavix , atorvastatin .  Type 2 Diabetes Mellitus   He is on metformin  with good tolerance. Emphasized diabetes management for heart disease prevention. Continue metformin  and encourage dietary management with mostly plant based diet limiting processed food, sugar, simple carbohydrates and saturated fat.   Hyperlipidemia   Lipid panel completed 01/05/2024 with total cholesterol 130, triglycerides 84, HDL 46, LDL 66. Cholesterol levels are well-managed. Continue atorvastatin .   Gastroesophageal Reflux Disease (GERD)   Reflux symptoms have improved post-stent placement. Continue pantoprazole .  Follow-up   He prefers follow-up at the current location with no immediate concerns requiring earlier follow-up. Schedule follow-up in one year unless symptoms develop. Send paperwork to the gastroenterologist for the upcoming procedure.         Disposition: 1 year with Dr. Theodis Fiscal or APP  Signed, Slater Duncan, NP-C

## 2024-02-16 NOTE — Patient Instructions (Signed)
 Medication Instructions:  Your physician recommends that you continue on your current medications as directed. Please refer to the Current Medication list given to you today.   Follow-Up: At [facility], you and your health needs are our priority.  As part of our continuing mission to provide you with exceptional heart care, our providers are all part of one team.  This team includes your primary Cardiologist (physician) and Advanced Practice Providers or APPs (Physician Assistants and Nurse Practitioners) who all work together to provide you with the care you need, when you need it.  Please follow up in 1 year  with Dr. Theodis Fiscal, Slater Duncan, NP or Neomi Banks, NP

## 2024-02-18 ENCOUNTER — Telehealth: Payer: Self-pay | Admitting: Internal Medicine

## 2024-02-18 ENCOUNTER — Encounter: Admitting: Internal Medicine

## 2024-02-18 NOTE — Telephone Encounter (Signed)
 PT rescheduled EGD for today at 9am due to the fact he had his dates mixed up and thought it was tomorrow. PT will be in tomorrow at 7am to have procedure.

## 2024-02-18 NOTE — Telephone Encounter (Signed)
 Please see note.

## 2024-02-18 NOTE — Telephone Encounter (Signed)
 Noted. Thanks.

## 2024-02-19 ENCOUNTER — Encounter: Payer: Self-pay | Admitting: Internal Medicine

## 2024-02-19 ENCOUNTER — Ambulatory Visit: Admitting: Cardiology

## 2024-02-19 ENCOUNTER — Ambulatory Visit (AMBULATORY_SURGERY_CENTER): Admitting: Internal Medicine

## 2024-02-19 VITALS — BP 110/66 | HR 56 | Temp 97.2°F | Resp 14 | Ht 71.0 in | Wt 186.4 lb

## 2024-02-19 DIAGNOSIS — R1310 Dysphagia, unspecified: Secondary | ICD-10-CM

## 2024-02-19 DIAGNOSIS — Q399 Congenital malformation of esophagus, unspecified: Secondary | ICD-10-CM | POA: Diagnosis not present

## 2024-02-19 DIAGNOSIS — J029 Acute pharyngitis, unspecified: Secondary | ICD-10-CM

## 2024-02-19 DIAGNOSIS — R09A2 Foreign body sensation, throat: Secondary | ICD-10-CM

## 2024-02-19 DIAGNOSIS — K449 Diaphragmatic hernia without obstruction or gangrene: Secondary | ICD-10-CM

## 2024-02-19 DIAGNOSIS — K319 Disease of stomach and duodenum, unspecified: Secondary | ICD-10-CM | POA: Diagnosis not present

## 2024-02-19 DIAGNOSIS — R0902 Hypoxemia: Secondary | ICD-10-CM | POA: Diagnosis not present

## 2024-02-19 DIAGNOSIS — I2510 Atherosclerotic heart disease of native coronary artery without angina pectoris: Secondary | ICD-10-CM | POA: Diagnosis not present

## 2024-02-19 DIAGNOSIS — E785 Hyperlipidemia, unspecified: Secondary | ICD-10-CM | POA: Diagnosis not present

## 2024-02-19 MED ORDER — SODIUM CHLORIDE 0.9 % IV SOLN: 500.0000 mL | INTRAVENOUS | Status: DC

## 2024-02-19 NOTE — Patient Instructions (Signed)
 Resume Plavix  today.  Return care to Dr. Ester Helms.  YOU HAD AN ENDOSCOPIC PROCEDURE TODAY AT THE [facility]:   Refer to the procedure report that was given to you for any specific questions about what was found during the examination.  If the procedure report does not answer your questions, please call your gastroenterologist to clarify.  If you requested that your care partner not be given the details of your procedure findings, then the procedure report has been included in a sealed envelope for you to review at your convenience later.  YOU SHOULD EXPECT: Some feelings of bloating in the abdomen. Passage of more gas than usual.  Walking can help get rid of the air that was put into your GI tract during the procedure and reduce the bloating. If you had a lower endoscopy (such as a colonoscopy or flexible sigmoidoscopy) you may notice spotting of blood in your stool or on the toilet paper. If you underwent a bowel prep for your procedure, you may not have a normal bowel movement for a few days.  Please Note:  You might notice some irritation and congestion in your nose or some drainage.  This is from the oxygen used during your procedure.  There is no need for concern and it should clear up in a day or so.  SYMPTOMS TO REPORT IMMEDIATELY:   Following upper endoscopy (EGD)  Vomiting of blood or coffee ground material  New chest pain or pain under the shoulder blades  Painful or persistently difficult swallowing  New shortness of breath  Fever of 100F or higher  Black, tarry-looking stools  For urgent or emergent issues, a gastroenterologist can be reached at any hour by calling [phone]. Do not use MyChart messaging for urgent concerns.    DIET:  We do recommend a small meal at first, but then you may proceed to your regular diet.  Drink plenty of fluids but you should avoid alcoholic beverages for 24 hours.  ACTIVITY:  You should plan to take it easy for the rest of  today and you should NOT DRIVE or use heavy machinery until tomorrow (because of the sedation medicines used during the test).    FOLLOW UP: Our staff will call the number listed on your records the next business day following your procedure.  We will call around 7:15- 8:00 am to check on you and address any questions or concerns that you may have regarding the information given to you following your procedure. If we do not reach you, we will leave a message.     If any biopsies were taken you will be contacted by phone or by letter within the next 1-3 weeks.  Please call us  at [phone] if you have not heard about the biopsies in 3 weeks.    SIGNATURES/CONFIDENTIALITY: You and/or your care partner have signed paperwork which will be entered into your electronic medical record.  These signatures attest to the fact that that the information above on your After Visit Summary has been reviewed and is understood.  Full responsibility of the confidentiality of this discharge information lies with you and/or your care-partner.

## 2024-02-19 NOTE — Op Note (Signed)
 [facility] Patient Name: Peter Taylor Procedure Date: 02/19/2024 9:27 AM MRN: [id number] Endoscopist: Murel Arlington. Elvin Hammer , MD, [id number] Age: 73 Referring MD:  Date of Birth: [birth year] Gender: Male Account #: [id number] Procedure:                Upper GI endoscopy Indications:              Dysphagia?"vague. Negative swallowing study. Globus. Medicines:                Monitored Anesthesia Care Procedure:                Pre-Anesthesia Assessment:                           - Prior to the procedure, a History and Physical                            was performed, and patient medications and                            allergies were reviewed. The patient's tolerance of                            previous anesthesia was also reviewed. The risks                            and benefits of the procedure and the sedation                            options and risks were discussed with the patient.                            All questions were answered, and informed consent                            was obtained. Prior Anticoagulants: The patient has                            taken Plavix  3 days ago. ASA Grade Assessment: III                            - A patient with severe systemic disease. After                            reviewing the risks and benefits, the patient was                            deemed in satisfactory condition to undergo the                            procedure.                           After obtaining informed consent, the endoscope was  passed under direct vision. Throughout the                            procedure, the patient's blood pressure, pulse, and                            oxygen saturations were monitored continuously. The                            GIF [id number] #[id number] was introduced through the                            mouth, and advanced to the second part of duodenum.                            The upper GI endoscopy  was accomplished without                            difficulty. The patient tolerated the procedure                            well. Scope In: Scope Out: Findings:                 The esophagus was somewhat tortuous but otherwise                            normal. No stricture, esophagitis, or other                            abnormality.                           The stomach revealed a small hiatal hernia and a                            small area of submucosal erythema in the antrum.                            Otherwise normal.                           The examined duodenum was normal.                           The cardia and gastric fundus were normal on                            retroflexion. Complications:            No immediate complications. Estimated Blood Loss:     Estimated blood loss: none. Impression:               1. Unremarkable EGD                           2. No significant esophageal abnormalities  identified Recommendation:           - Patient has a contact number available for                            emergencies. The signs and symptoms of potential                            delayed complications were discussed with the                            patient. Return to normal activities tomorrow.                            Written discharge instructions were provided to the                            patient.                           - Resume previous diet.                           - Continue present medications.                           - Resume Plavix  today                           - Return to the care of Dr. Graylon Leader. Elvin Hammer, MD 02/19/2024 9:47:19 AM This report has been signed electronically.

## 2024-02-19 NOTE — Progress Notes (Signed)
 Expand All Collapse All    Chief Complaint: GERD   HPI:  [name] is a  73 y/o male, known to Dr. Elvin Hammer with a past medical history as listed below including CAD status post stent placement 12/10/2019 on Plavix , who was referred to me by Benjiman Bras, MD for a complaint of GERD.      11/25/2019 office visit with Dr. Elvin Hammer for GERD, abdominal pain, minor rectal bleeding and need for colonoscopy.  Also some postprandial bloating.  At that time continued on a PPI and scheduled for a colonoscopy and EGD.    12/07/2019 colonoscopy with one 1 mm polyp in the ascending colon and diverticulosis in the entire colon as well as internal hemorrhoids.  Repeat recommended in 7 to 10 years.    12/07/2019 EGD for dyspepsia and abdominal bloating was normal.  Patient scheduled for abdominal ultrasound to evaluate dyspepsia and bloating discomfort.    12/14/2019 abdominal ultrasound with fatty liver and otherwise normal.    12/23/2023 esophagram done for dysphagia with evidence of hernia and retropulsion of barium through the esophagus.    01/05/2024 hemoglobin A1c 7.6, CMP with a glucose of 139 and alk phos of 126 (perpetually elevated).    01/05/2024 patient saw PCP and at that time on Protonix  40 mg daily and had been seen multiple times for sore throat.  Had an ENT evaluation the month before with no significant findings other than reflux and LPR.  His nasal drainage is contributing.  Patient had a modified barium swallow in February no treatment recommended or SLP follow-up indicated.    Today, the patient presents to clinic and tells me that he has had issues with feeling like something is in his throat or that things are getting stuck for the past year at least.  Tells me that sometimes he feels like he swallows and he feels something kind of come up on the edge of his esophagus on the sides.  Notes that if he drinks something bubbly like Sprite or caffeinated it almost hurts to swallow it down.  Also tells me when  he is eating things get stuck and occasionally he has had to vomit or regurgitate in order to get these out.  He has found that if he drinks some water  with eating in between bites this seems to help things to go down.  He is on Pantoprazole  40 mg daily.    Denies fever, chills, weight loss, abdominal pain, nausea or vomiting.       Past Medical History:  Diagnosis Date   CAD (coronary artery disease), native coronary artery      a. Cath 12/10/19: 90% mLAD s/p DES, mild non obstructive disease in RCA and Cx   GERD (gastroesophageal reflux disease)     Hyperlipidemia     Substance abuse (HCC)     Tuberculosis                 Past Surgical History:  Procedure Laterality Date   CORONARY STENT INTERVENTION N/A 12/10/2019    Procedure: CORONARY STENT INTERVENTION;  Surgeon: Lucendia Rusk, MD;  Location: [facility] INVASIVE CV LAB;  Service: Cardiovascular;  Laterality: N/A;   LEFT HEART CATH AND CORONARY ANGIOGRAPHY N/A 12/10/2019    Procedure: LEFT HEART CATH AND CORONARY ANGIOGRAPHY;  Surgeon: Lucendia Rusk, MD;  Location: [facility] INVASIVE CV LAB;  Service: Cardiovascular;  Laterality: N/A;   NO PAST SURGERIES  Current Outpatient Medications  Medication Sig Dispense Refill   Accu-Chek Softclix Lancets lancets daily.       atorvastatin  (LIPITOR) 40 MG tablet TAKE 1 TABLET(40 MG) BY MOUTH DAILY 90 tablet 1   Blood Glucose Monitoring Suppl DEVI 1 each by Does not apply route daily in the afternoon. May substitute to any manufacturer covered by patient's insurance. 1 each 0   cetirizine  (ZYRTEC ) 10 MG tablet Take 1 tablet (10 mg total) by mouth daily. 30 tablet 11   clopidogrel  (PLAVIX ) 75 MG tablet TAKE 1 TABLET(75 MG) BY MOUTH DAILY WITH BREAKFAST 90 tablet 1   fluticasone  (FLONASE ) 50 MCG/ACT nasal spray Place 2 sprays into both nostrils 2 (two) times daily. 16 g 6   metFORMIN  (GLUCOPHAGE ) 500 MG tablet Take 1 tablet (500 mg total) by mouth daily with breakfast. 90  tablet 1   pantoprazole  (PROTONIX ) 40 MG tablet Take 1 tablet (40 mg total) by mouth daily. 90 tablet 1   tamsulosin  (FLOMAX ) 0.4 MG CAPS capsule Take 2 capsules (0.8 mg total) by mouth daily. TAKE 1 CAPSULE(0.4 MG) BY MOUTH DAILY 180 capsule 1   tiZANidine  (ZANAFLEX ) 4 MG tablet Take 1 tablet (4 mg total) by mouth every 8 (eight) hours as needed for muscle spasms. Start once at bedtime. 30 tablet 0      No current facility-administered medications for this visit.             Allergies as of 01/21/2024 - Review Complete 01/05/2024  Allergen Reaction Noted   Penicillins Hives 07/17/2013           Family History  Problem Relation Age of Onset   Cancer Mother          unsure type   Cancer Brother          pancreatic   Ovarian cancer Sister     Ovarian cancer Sister     Ovarian cancer Sister            Social History         Socioeconomic History   Marital status: Single      Spouse name: Not on file   Number of children: 8   Years of education: Not on file   Highest education level: Not on file  Occupational History   Occupation: truck driver  Tobacco Use   Smoking status: Former   Smokeless tobacco: Former  Building services engineer status: Never Used  Substance and Sexual Activity   Alcohol use: Yes      Comment: occ   Drug use: No   Sexual activity: Not Currently  Other Topics Concern   Not on file  Social History Narrative   Not on file    Social Drivers of Health        Financial Resource Strain: Low Risk  (04/24/2023)    Overall Financial Resource Strain (CARDIA)     Difficulty of Paying Living Expenses: Not hard at all  Food Insecurity: No Food Insecurity (04/24/2023)    Hunger Vital Sign     Worried About Running Out of Food in the Last Year: Never true     Ran Out of Food in the Last Year: Never true  Transportation Needs: No Transportation Needs (04/24/2023)    PRAPARE - Therapist, art (Medical): No     Lack of  Transportation (Non-Medical): No  Physical Activity: Inactive (04/24/2023)    Exercise Vital Sign     Days  of Exercise per Week: 0 days     Minutes of Exercise per Session: 0 min  Stress: No Stress Concern Present (04/24/2023)    [employer] of Occupational Health - Occupational Stress Questionnaire     Feeling of Stress : Not at all  Social Connections: Unknown (04/24/2023)    Social Connection and Isolation Panel [NHANES]     Frequency of Communication with Friends and Family: More than three times a week     Frequency of Social Gatherings with Friends and Family: Never     Attends Religious Services: More than 4 times per year     Active Member of [employer] or Organizations: No     Attends Banker Meetings: Never     Marital Status: Not on file  Intimate Partner Violence: Not At Risk (04/24/2023)    Humiliation, Afraid, Rape, and Kick questionnaire     Fear of Current or Ex-Partner: No     Emotionally Abused: No     Physically Abused: No     Sexually Abused: No      Review of Systems:    Constitutional: No weight loss, fever or chills HEENT: No cough Cardiovascular: No chest pain Respiratory: No SOB  Gastrointestinal: See HPI and otherwise negative Genitourinary: No dysuria Neurological: No headache, dizziness or syncope Musculoskeletal: No new muscle or joint pain Hematologic: No bleeding  Psychiatric: No history of depression or anxiety    Physical Exam:  Vital signs: BP 106/68   Pulse 67   Ht 5\' 11"  (1.803 m)   Wt 186 lb 6.4 oz (84.6 kg)   BMI 26.00 kg/m   Constitutional:   Pleasant AA male appears to be in NAD, Well developed, Well nourished, alert and cooperative Head:  Normocephalic and atraumatic. Eyes:   PEERL, EOMI. No icterus. Conjunctiva pink. Ears:  Normal auditory acuity. Neck:  Supple Throat: Oral cavity and pharynx without inflammation, swelling or lesion.  Respiratory: Respirations even and unlabored. Lungs clear to auscultation  bilaterally.   No wheezes, crackles, or rhonchi.  Cardiovascular: Normal S1, S2. No MRG. Regular rate and rhythm. No peripheral edema, cyanosis or pallor.  Gastrointestinal:  Soft, nondistended, nontender. No rebound or guarding. Normal bowel sounds. No appreciable masses or hepatomegaly. Rectal:  Not performed.  Msk:  Symmetrical without gross deformities. Without edema, no deformity or joint abnormality.  Neurologic:  Alert and  oriented x4;  grossly normal neurologically.  Skin:   Dry and intact without significant lesions or rashes. Psychiatric: Demonstrates good judgement and reason without abnormal affect or behaviors.   RELEVANT LABS AND IMAGING: CBC Labs (Brief)          Component Value Date/Time    WBC 6.3 03/17/2023 1156    RBC 4.63 03/17/2023 1156    HGB 13.7 03/17/2023 1156    HGB 14.5 04/09/2021 1019    HCT 40.4 03/17/2023 1156    HCT 43.8 04/09/2021 1019    PLT 231.0 03/17/2023 1156    PLT 309 04/09/2021 1019    MCV 87.4 03/17/2023 1156    MCV 86 04/09/2021 1019    MCH 28.4 04/09/2021 1019    MCH 28.5 12/22/2019 1611    MCHC 33.9 03/17/2023 1156    RDW 15.1 03/17/2023 1156    RDW 14.4 04/09/2021 1019    LYMPHSABS 1.9 01/02/2022 1101    MONOABS 0.3 01/02/2022 1101    EOSABS 0.3 01/02/2022 1101    BASOSABS 0.0 01/02/2022 1101        CMP  Labs (Brief)          Component Value Date/Time    NA 139 01/05/2024 0912    NA 142 01/17/2021 1006    K 3.8 01/05/2024 0912    CL 103 01/05/2024 0912    CO2 27 01/05/2024 0912    GLUCOSE 139 (H) 01/05/2024 0912    BUN 17 01/05/2024 0912    BUN 16 01/17/2021 1006    CREATININE 1.10 01/05/2024 0912    CALCIUM  10.1 01/05/2024 0912    PROT 8.0 01/05/2024 0912    PROT 7.5 04/09/2021 1019    ALBUMIN 4.2 01/05/2024 0912    ALBUMIN 4.7 04/09/2021 1019    AST 19 01/05/2024 0912    ALT 21 01/05/2024 0912    ALKPHOS 126 (H) 01/05/2024 0912    BILITOT 0.4 01/05/2024 0912    BILITOT 0.5 04/09/2021 1019    GFRNONAA 62  07/19/2020 0954    GFRAA 72 07/19/2020 0954        Assessment: 1.  Dysphagia/globus sensation/sore throat: Patient has had barium esophagram, modified barium swallow with speech pathology and evaluation by ENT for this with no real cause seen, has been found to have reflux; consider esophageal stricture versus esophagitis versus other   Plan: 1.  Scheduled patient for diagnostic EGD with dilation in the [facility] with Dr. Elvin Hammer.  Did provide the patient a detailed list of risk for the  procedure and he agrees to proceed. Patient is appropriate for endoscopic procedure(s) in the ambulatory ([facility]) setting.  2.  Patient to hold his Plavix  for 5 days prior to time of procedure.  We will communicate with Dr. Marrie Sizer to ensure this is acceptable for him. 3.  For now continue Pantoprazole  40 mg daily, may benefit from Carafate in the future for sore throat complaints, can wait until after time of EGD. 4.  Reviewed anti-dysphagia measures. 5.  Patient to follow in clinic per recommendations from Dr. Elvin Hammer after time of procedure   Reginal Capra, PA-C [facility] 01/21/2024, 10:57 AM        Recent H&P as above.  Patient last took Plavix  Monday.  Noted.

## 2024-02-19 NOTE — Progress Notes (Signed)
 Pt's states no medical or surgical changes since previsit or office visit.

## 2024-02-20 ENCOUNTER — Telehealth: Payer: Self-pay

## 2024-02-20 NOTE — Telephone Encounter (Signed)
 No answer after attempting follow up call x2. Unable to leave voice message.

## 2024-02-23 ENCOUNTER — Telehealth: Payer: Self-pay

## 2024-02-23 NOTE — Telephone Encounter (Signed)
 After reviewing schedules for Dr. Paulla Bossier I noticed this patient was on her schedule with appt notes "follow up." I attempted to reach patient to see what he was coming in for as she is not his PCP. Patient's vm is full and both numbers listed for emergency contacts are incorrect. I reached out to the [employer] agent that scheduled him, Shereese, to see if she could remember what he was needing to be seen for. After a few moments she stated she had patient on the phone and he was saying this was an ER follow up. I checked his chart and confirmed if he was referring to the ER visit from almost a month ago in March, Peter Taylor confirmed. I did state that it's best if he see Dr. Ester Helms since he is his PCP. I have scheduled him for 4/30 at 11:40. I wanted to make you aware so that you could review the ER note and plan.

## 2024-02-24 ENCOUNTER — Ambulatory Visit: Admitting: Family Medicine

## 2024-02-24 NOTE — Telephone Encounter (Signed)
 Noted. Thanks.

## 2024-02-25 ENCOUNTER — Ambulatory Visit (INDEPENDENT_AMBULATORY_CARE_PROVIDER_SITE_OTHER): Admitting: Family Medicine

## 2024-02-25 DIAGNOSIS — R21 Rash and other nonspecific skin eruption: Secondary | ICD-10-CM

## 2024-02-25 DIAGNOSIS — A530 Latent syphilis, unspecified as early or late: Secondary | ICD-10-CM | POA: Diagnosis not present

## 2024-02-25 NOTE — Patient Instructions (Signed)
 I will refer you to infectious disease to follow up on the positive syphilis testing and to decide if other testing needed and repeat testing. If any new rash or lesions be seen. Take care!

## 2024-02-25 NOTE — Progress Notes (Signed)
 Subjective:  Patient ID: Peter Taylor, male    DOB: 1950/12/21  Age: 73 y.o. MRN: [id number]  CC:  Chief Complaint  Patient presents with   Hospitalization Follow-up    Pt was seen in ED for treatment of rash on his groin on 01/27/2024 pt reports his sxs have resolved. Notes no concerns from him     HPI [name] presents for   ER follow-up for rectal/penile lesion.  Evaluated in the ED on 01/25/2024, notes reviewed.  Rash of rectum and foreskin noted, present for approximately 3 weeks with some drainage of foul-smelling liquid.  On exam there is suspicion for possible condyloma but some purulent appearing drainage.  STI screening was obtained, given treatment of Rocephin  plus doxycycline  for 7 days at 100 mg twice daily GC/CT testing was negative.  HIV was nonreactive.  For treatment of possible chlamydia, gonorrhea. RPR was reactive with titer 1:64  On discussion today, he notes he did not have any discharge. Noticed rash after oral sex - about 2 days later - 3 puncture looking wounds with some gel that was expressed from area. He felt like areas were from abrasion from partner's teeth. Had 3 bumps around buttock initially as well. Looked like hair bump, with pus/blood expressed.   He was seen by health department few days after the ER. Was advised about syphilis from health department. Notes are unavailable - records requested.  He was given another set of pills to take for additional week after rx finished from ER.  Advised to call in 3 weeks for results of tests?   Reactive RPR on 3/30, titer 1:64,  positive treponema pallidum abs  Prior rash resolved - dark spots in area where rash was. No new blisters/ulcers, penile d/c.    History Patient Active Problem List   Diagnosis Date Noted   Thoracic back pain 09/12/2023   Gastroesophageal reflux disease without esophagitis 01/16/2023   Type 2 diabetes mellitus without complication, without long-term current use of insulin  (HCC) 01/16/2023   Thyroid  nodule 01/16/2023   Elevated PSA 01/16/2023   Benign prostatic hyperplasia 01/16/2023   Dysphagia 08/01/2021   Multiple thyroid  nodules 08/01/2021   Coronary artery disease    Hyperlipidemia 11/11/2019   Past Medical History:  Diagnosis Date   CAD (coronary artery disease), native coronary artery    a. Cath 12/10/19: 90% mLAD s/p DES, mild non obstructive disease in RCA and Cx   GERD (gastroesophageal reflux disease)    Hyperlipidemia    Substance abuse (HCC)    Tuberculosis    Past Surgical History:  Procedure Laterality Date   CORONARY STENT INTERVENTION N/A 12/10/2019   Procedure: CORONARY STENT INTERVENTION;  Surgeon: Lucendia Rusk, MD;  Location: [facility] INVASIVE CV LAB;  Service: Cardiovascular;  Laterality: N/A;   LEFT HEART CATH AND CORONARY ANGIOGRAPHY N/A 12/10/2019   Procedure: LEFT HEART CATH AND CORONARY ANGIOGRAPHY;  Surgeon: Lucendia Rusk, MD;  Location: [facility] INVASIVE CV LAB;  Service: Cardiovascular;  Laterality: N/A;   NO PAST SURGERIES     Allergies  Allergen Reactions   Penicillins Hives    Did it involve swelling of the face/tongue/throat, SOB, or low BP? No Did it involve sudden or severe rash/hives, skin peeling, or any reaction on the inside of your mouth or nose? No Did you need to seek medical attention at a hospital or doctor's office? No When did it last happen?      35+ years If all above answers  are "NO", may proceed with cephalosporin use.    Prior to Admission medications   Medication Sig Start Date End Date Taking? Authorizing Provider  Accu-Chek Softclix Lancets lancets daily. 08/27/23  Yes [provider]  atorvastatin  (LIPITOR) 40 MG tablet TAKE 1 TABLET(40 MG) BY MOUTH DAILY 01/05/24  Yes Benjiman Bras, MD  Blood Glucose Monitoring Suppl DEVI 1 each by Does not apply route daily in the afternoon. May substitute to any manufacturer covered by patient's insurance. 08/27/23  Yes Benjiman Bras, MD   cetirizine  (ZYRTEC ) 10 MG tablet Take 1 tablet (10 mg total) by mouth daily. 12/01/23  Yes Soldatova, Li[place], MD  clopidogrel  (PLAVIX ) 75 MG tablet TAKE 1 TABLET(75 MG) BY MOUTH DAILY WITH BREAKFAST 01/05/24  Yes Benjiman Bras, MD  metFORMIN  (GLUCOPHAGE ) 500 MG tablet Take 1 tablet (500 mg total) by mouth daily with breakfast. 01/13/24  Yes Benjiman Bras, MD  pantoprazole  (PROTONIX ) 40 MG tablet Take 1 tablet (40 mg total) by mouth daily. 01/05/24  Yes Benjiman Bras, MD  tamsulosin  (FLOMAX ) 0.4 MG CAPS capsule Take 2 capsules (0.8 mg total) by mouth daily. TAKE 1 CAPSULE(0.4 MG) BY MOUTH DAILY 01/05/24  Yes Benjiman Bras, MD  tiZANidine  (ZANAFLEX ) 4 MG tablet Take 1 tablet (4 mg total) by mouth every 8 (eight) hours as needed for muscle spasms. Start once at bedtime. 09/22/23  Yes Benjiman Bras, MD   Social History   Socioeconomic History   Marital status: Single    Spouse name: Not on file   Number of children: 8   Years of education: Not on file   Highest education level: Not on file  Occupational History   Occupation: truck driver  Tobacco Use   Smoking status: Former   Smokeless tobacco: Former  Building services engineer status: Never Used  Substance and Sexual Activity   Alcohol use: Yes    Comment: occ   Drug use: No   Sexual activity: Not Currently  Other Topics Concern   Not on file  Social History Narrative   Not on file   Social Drivers of Health   Financial Resource Strain: Low Risk  (04/24/2023)   Overall Financial Resource Strain (CARDIA)    Difficulty of Paying Living Expenses: Not hard at all  Food Insecurity: No Food Insecurity (04/24/2023)   Hunger Vital Sign    Worried About Running Out of Food in the Last Year: Never true    Ran Out of Food in the Last Year: Never true  Transportation Needs: No Transportation Needs (04/24/2023)   PRAPARE - Administrator, Civil Service (Medical): No    Lack of Transportation (Non-Medical): No  Physical  Activity: Inactive (04/24/2023)   Exercise Vital Sign    Days of Exercise per Week: 0 days    Minutes of Exercise per Session: 0 min  Stress: No Stress Concern Present (04/24/2023)   [employer] of Occupational Health - Occupational Stress Questionnaire    Feeling of Stress : Not at all  Social Connections: Unknown (04/24/2023)   Social Connection and Isolation Panel [NHANES]    Frequency of Communication with Friends and Family: More than three times a week    Frequency of Social Gatherings with Friends and Family: Never    Attends Religious Services: More than 4 times per year    Active Member of [employer] or Organizations: No    Attends Banker Meetings: Never    Marital Status: Not  on file  Intimate Partner Violence: Not At Risk (04/24/2023)   Humiliation, Afraid, Rape, and Kick questionnaire    Fear of Current or Ex-Partner: No    Emotionally Abused: No    Physically Abused: No    Sexually Abused: No    Review of Systems   Objective:  There were no vitals filed for this visit.   Physical Exam Vitals reviewed.  Constitutional:      Appearance: He is well-developed.  HENT:     Head: Normocephalic and atraumatic.  Neck:     Vascular: No carotid bruit or JVD.  Cardiovascular:     Rate and Rhythm: Normal rate and regular rhythm.     Heart sounds: Normal heart sounds. No murmur heard. Pulmonary:     Effort: Pulmonary effort is normal.     Breath sounds: Normal breath sounds. No rales.  Genitourinary:    Comments: Exam deferred - denies any new lesions.  Musculoskeletal:     Right lower leg: No edema.     Left lower leg: No edema.  Skin:    General: Skin is warm and dry.  Neurological:     Mental Status: He is alert and oriented to person, place, and time.  Psychiatric:        Mood and Affect: Mood normal.        Assessment & Plan:  RAINI RITTHALER is a 73 y.o. male . Rash of genital area - Plan: Ambulatory referral to Infectious  Disease  Positive serology for syphilis - Plan: Ambulatory referral to Infectious Disease ER follow-up for genital rash.  Based on lab results and symptoms suspected primary syphilis.  Reports improvement of symptoms, healing of prior rash.  On discussion it does sound like he did have a visit with health department who discussed the positive syphilis testing.  Initially HIV testing was nonreactive.  Other STI screening reassuring.  I will refer him to infectious disease to decide if repeat testing/titers needed and intervals of this testing.  Safer sex practices discussed, RTC precautions given.  No orders of the defined types were placed in this encounter.  Patient Instructions  I will refer you to infectious disease to follow up on the positive syphilis testing and to decide if other testing needed and repeat testing. If any new rash or lesions be seen. Take care!    Signed,   Caro Christmas, MD [facility], [facility] 02/26/24 11:13 AM

## 2024-02-26 ENCOUNTER — Encounter: Payer: Self-pay | Admitting: Family Medicine

## 2024-02-27 ENCOUNTER — Ambulatory Visit (INDEPENDENT_AMBULATORY_CARE_PROVIDER_SITE_OTHER): Payer: Medicare PPO | Admitting: Otolaryngology

## 2024-03-01 ENCOUNTER — Encounter (INDEPENDENT_AMBULATORY_CARE_PROVIDER_SITE_OTHER): Payer: Self-pay | Admitting: Otolaryngology

## 2024-03-01 ENCOUNTER — Ambulatory Visit (INDEPENDENT_AMBULATORY_CARE_PROVIDER_SITE_OTHER): Admitting: Otolaryngology

## 2024-03-01 VITALS — BP 148/75 | HR 82

## 2024-03-01 DIAGNOSIS — E042 Nontoxic multinodular goiter: Secondary | ICD-10-CM | POA: Diagnosis not present

## 2024-03-01 DIAGNOSIS — R09A2 Foreign body sensation, throat: Secondary | ICD-10-CM

## 2024-03-01 DIAGNOSIS — R1310 Dysphagia, unspecified: Secondary | ICD-10-CM | POA: Diagnosis not present

## 2024-03-01 DIAGNOSIS — R070 Pain in throat: Secondary | ICD-10-CM | POA: Diagnosis not present

## 2024-03-01 DIAGNOSIS — J3089 Other allergic rhinitis: Secondary | ICD-10-CM

## 2024-03-01 DIAGNOSIS — R0982 Postnasal drip: Secondary | ICD-10-CM | POA: Diagnosis not present

## 2024-03-01 DIAGNOSIS — K449 Diaphragmatic hernia without obstruction or gangrene: Secondary | ICD-10-CM

## 2024-03-01 DIAGNOSIS — K219 Gastro-esophageal reflux disease without esophagitis: Secondary | ICD-10-CM | POA: Diagnosis not present

## 2024-03-01 NOTE — Progress Notes (Signed)
 ENT Progress Note:   Update 03/01/2024  Discussed the use of AI scribe software for clinical note transcription with the patient, who gave verbal consent to proceed.  History of Present Illness Peter Taylor is a 73 year old male with hx of  hiatal hernia and thyroid  nodules who presents with throat discomfort and swallowing difficulties. He had improvement in his sx since last OV. Had MBS/esophagram, which demonstrated intact oropharyngeal swallowing w/o aspiration or penetration.   He has been experiencing throat discomfort and difficulties with swallowing, which have improved over the last couple of weeks. He is more cautious when swallowing, which has helped alleviate the symptoms. No recent episodes of food getting stuck or significant throat irritation.  A recent swallow study indicated a small hiatal hernia with reflux but otherwise no other pathology. He is currently on Protonix  for reflux management and describes experiencing foul taste in his mouth, which may be related to reflux.  He has a history of multiple thyroid  nodules, which were previously biopsied and found to be benign. He is following up regarding these nodules and a possible parathyroid adenoma. F/b Dr Sofia Dunn for this.   He had been using Flonase  and Zyrtec  for postnasal drainage control initially but has since stopped. He reports overall improvement in throat discomfort.   Records Reviewed:  Initial Evaluation 12/01/23  Reason for Consult: throat discomfort x 2 yrs and dysphagia x 2 yrs   HPI: Discussed the use of AI scribe software for clinical note transcription with the patient, who gave verbal consent to proceed.  History of Present Illness   The patient  is a 25 yoM who presents with chronic throat discomfort and swallowing difficulties.  Chronic throat discomfort and swallowing difficulties have persisted for a couple of years, described as a sensation similar to 'swallowing a fish bone.' These symptoms occur  with both solids and liquids, with temporary relief from drinking water . Occasional voice changes with raspy voice. Reflux medication has not improved symptoms (on Protonix  40 mg daily). An upper endoscopy in 2021 showed clear vocal cords and no foreign bodies. Occasional coughing or choking on liquids occurs inconsistently.  Shortness of breath is experienced regardless of activity level and has not been previously discussed with their primary care doctor.  A past issue with ear pain about four months ago was treated with medication and is not ongoing.  They have a history of smoking, having quit approximately eight years ago.       Records Reviewed:  11/03/2023 PCP Dr Peter Taylor Sore throat follow-up from December 6 visit.  Possible breakthrough reflux as contributor to his throat symptoms at that time.  Was continued on a PPI daily, added H2 blocker.  Handout given on trigger foods for reflux, including decreasing orange juice intake.  Additionally was referred to gastroenterology to decide if repeat EGD needed, previous 1 in 2021 with Dr. Elvin Hammer, normal esophagus stomach and duodenum at that time. Stopped orange juice. Symptoms are about the same with some discomfort swallowing certain foods - anything with roughage like crusty bread.  Ginger ale, Dr. Kathlene Paradise has made symptoms worse - burning sensation in throat.  No significant change with use of Pepcid  at bedtime.  Denies weight loss, night sweats, fevers, no vomiting/regurgitation of food, but catching feeling with swallowing. Able to clear secretions and swallowing solid and liquids. Drink of water  before food is helpful.  Episodic change in voice, hoarse at times, not persistent. No white patches in mouth/throat.  No abd pain or  chest pain.  Not immunosuppressed, no steroid inhalers.     Past Medical History:  Diagnosis Date   CAD (coronary artery disease), native coronary artery    a. Cath 12/10/19: 90% mLAD s/p DES, mild non obstructive  disease in RCA and Cx   GERD (gastroesophageal reflux disease)    Hyperlipidemia    Substance abuse (HCC)    Tuberculosis     Past Surgical History:  Procedure Laterality Date   CORONARY STENT INTERVENTION N/A 12/10/2019   Procedure: CORONARY STENT INTERVENTION;  Surgeon: Lucendia Rusk, MD;  Location: [facility] INVASIVE CV LAB;  Service: Cardiovascular;  Laterality: N/A;   LEFT HEART CATH AND CORONARY ANGIOGRAPHY N/A 12/10/2019   Procedure: LEFT HEART CATH AND CORONARY ANGIOGRAPHY;  Surgeon: Lucendia Rusk, MD;  Location: [facility] INVASIVE CV LAB;  Service: Cardiovascular;  Laterality: N/A;   NO PAST SURGERIES      Family History  Problem Relation Age of Onset   Rectal cancer Mother    Cancer Mother        unsure type   Rectal cancer Sister    Ovarian cancer Sister    Ovarian cancer Sister    Ovarian cancer Sister    Cancer Brother        pancreatic   Colon cancer Maternal Uncle    Stomach cancer Neg Hx     Social History:  reports that he has quit smoking. He has quit using smokeless tobacco. He reports current alcohol use. He reports that he does not use drugs.  Allergies:  Allergies  Allergen Reactions   Penicillins Hives    Did it involve swelling of the face/tongue/throat, SOB, or low BP? No Did it involve sudden or severe rash/hives, skin peeling, or any reaction on the inside of your mouth or nose? No Did you need to seek medical attention at a hospital or doctor's office? No When did it last happen?      35+ years If all above answers are "NO", may proceed with cephalosporin use.     Medications: I have reviewed the patient's current medications.  The PMH, PSH, Medications, Allergies, and SH were reviewed and updated.  ROS: Constitutional: Negative for fever, weight loss and weight gain. Cardiovascular: Negative for chest pain and dyspnea on exertion. Respiratory: Is not experiencing shortness of breath at rest. Gastrointestinal: Negative for nausea and  vomiting. Neurological: Negative for headaches. Psychiatric: The patient is not nervous/anxious  Blood pressure (!) 148/75, pulse 82, SpO2 97%.  PHYSICAL EXAM:  Exam: General: Well-developed, well-nourished Communication and Voice: slightly raspy Respiratory Respiratory effort: Equal inspiration and expiration without stridor Cardiovascular Peripheral Vascular: Warm extremities with equal color/perfusion Eyes: No nystagmus with equal extraocular motion bilaterally Neuro/Psych/Balance: Patient oriented to person, place, and time; Appropriate mood and affect; Gait is intact with no imbalance; Cranial nerves I-XII are intact Head and Face Inspection: Normocephalic and atraumatic without mass or lesion Facial Strength: Facial motility symmetric and full bilaterally ENT Pinna: External ear intact and fully developed External canal: Canal is patent with intact skin Tympanic Membrane: Clear and mobile External Nose: No scar or anatomic deformity Neck Neck and Trachea: Midline trachea without mass or lesion     Studies Reviewed: EGD 12/07/19    MBS/esophagram  12/23/23 MBS Clinical Impression: Pt exhibits an overall functional oropharyngeal swallow. There is a collection of vallecular and pyriform sinus residue after the initial swallow that pt independently clears with the use of an initiatied subswallow. No penetration or aspiration occurred with any  consistency trialed. Throughout the study, he reported the sensation of reflux and a globus sensation. Deferred evaluating pt's swallow with the barium tablet as he was scheduled to complete an esophagram immediately following this MBS. Recommend he continue his regular diet without SLP f/u. Provided education regarding general aspiration and esophageal precautions.   Esophagram  IMPRESSION: Esophagram with evidence of hernia and retropulsion of barium through the esophagus.  Thyroid  U/S 01/06/24 COMPARISON:  Prior thyroid  ultrasound  12/20/2022 and 05/09/2021   FINDINGS: Parenchymal Echotexture: Mildly heterogenous   Isthmus: 0.4 cm   Right lobe: 5.1 x 1.8 x 2.3 cm   Left lobe: 7.3 x 2.5 x 3.0 cm   _________________________________________________________   Estimated total number of nodules >/= 1 cm: 3   Number of spongiform nodules >/=  2 cm not described below (TR1): 0   Number of mixed cystic and solid nodules >/= 1.5 cm not described below (TR2): 0   _________________________________________________________   Nodule # 1: Isoechoic solid nodule versus pseudo nodule in the left upper gland measures approximately 1.3 x 11.1 x 1.3 cm. Findings are most consistent with TI-RADS category 3. Given size (<1.4 cm) and appearance, this nodule does NOT meet TI-RADS criteria for biopsy or dedicated follow-up.   Nodule # 2: Previously biopsied nodule in the left mid gland is decreasing in size and now measures 1.9 x 1.8 x 1.6 cm compared to 3.1 x 1.6 x 1.4 cm previously.   Nodule # 3: The previously biopsied nodule in the left lower gland is also slightly decreased in size at 3.5 x 3.1 x 2.6 cm compared to 4.1 x 3.6 x 2.8 cm previously.   Questionable parathyroid adenoma in the deep aspect of the right lower gland measures 1.2 x 0.7 x 1.1 cm. This is favored to represent and isoechoic solid thyroid  nodule which would be consistent with TI-RADS category 3. At less than 1.5 cm in size, this lesion does not meet criteria for further surveillance or follow-up. IMPRESSION: 1. No significant interval change in the size or appearance of previously biopsied nodules in the left mid and left inferior gland. Recommend correlation with prior biopsy results. 2. No new nodules or suspicious features.  Assessment/Plan: Encounter Diagnoses  Name Primary?   Globus sensation Yes   Dysphagia, unspecified type    Throat discomfort    Environmental and seasonal allergies    Chronic GERD    Post-nasal drip    Multiple  thyroid  nodules      Assessment and Plan    Chronic Throat Discomfort Chronic throat discomfort for a couple of years, described as feeling like swallowing a fish bone. Symptoms include dysphagia, intermittent dysphonia, and frequent throat clearing. No significant findings on scope exam; vocal cords clear, no foreign bodies, normal movement. He did have findings c/w GERD LPR. Likely related to chronic throat irritation from GERD and postnasal drainage. Discussed that reflux can cause tightening of the upper esophageal sphincter, leading to symptoms of dysphagia. He had normal EGD in 2021. Recommended dietary changes and avoiding lying down after eating to manage reflux. Suggested Reflux Gourmet (seaweed paste) as a supplement to prevent stomach contents from irritating the throat. - Continue Protonix   40 mg in the morning - Order swallow study to evaluate dysphagia sx - Recommend dietary changes to manage reflux - Avoid lying down immediately after eating - Recommend Reflux Gourmet (seaweed paste) after meals, especially after dinner - Gargle with salt water  to alleviate throat irritation  Chronic Gastroesophageal Reflux Disease (  GERD) Persistent symptoms despite current Protonix  regimen. Symptoms include throat discomfort and intermittent dysphonia. No improvement noted with current treatment (Protonix ). Discussed that reflux can cause chronic throat irritation and tightening of the upper esophageal sphincter. Recommended dietary changes and avoiding lying down after eating to manage reflux. Suggested Reflux Gourmet (seaweed paste) as a supplement to prevent stomach contents from irritating the throat. - Continue Protonix  in the morning - Recommend Reflux Gourmet (seaweed paste) after meals, especially after dinner - Recommend dietary changes to manage reflux - Avoid lying down immediately after eating  Dysphagia to solids mostly  - MBS esophagram - scope exam without pooling of secretions  in pyriforms, and he is able to tolerate regular diet currently  Chronic Postnasal Drip Frequent throat clearing and sensation of drainage from the nasopharynx to the throat. Discussed that postnasal drainage can contribute to throat irritation. Recommended treating with Flonase  and Zyrtec . - Prescribe Flonase  nasal spray 2 puffs b/l nares - Prescribe Zyrtec  10 mg daily  Update 03/01/2024 Assessment and Plan Assessment & Plan Throat discomfort dysphagia and dysphonia Previously scoped with no concerning findings b/l VF were mobile w/o lesions Throat discomfort and swallowing are better now. He had MBS/esophagram without aspiration penetration. Throat discomfort could be 2/2 GERD LPR. Improvement noted. Swallow study unremarkable but showed GERD and hiatal hernia.  - Continue Protonix  and consider reflux gourmet after meals  - diet and lifestyle changes to minimize GERD LPR - Consider Flonase  and Zyrtec  if postnasal drainage  - Advise careful eating habits. - Consider swallow therapy if symptoms worsen.  Hiatal hernia with gastroesophageal reflux Small hiatal hernia and GERD noted on esophagram. Increases risk of reflux and throat discomfort. Current reflux medication effective. No surgical intervention needed unless hernia enlarges or symptoms worsen.  - Continue Protonix . - Consider Reflux Gourmet after meals - diet and lifestyle changes  Post-nasal drainage: - Zyrtec  10 mg daily and Flonase  2 puffs b/l nares BID  Thyroid  nodules F/b Dr Sofia Dunn prior bx benign - see Dr Sofia Dunn as scheduled   I spent 30 minutes in total face-to-face time and in reviewing records during pre-charting, more than 50% of which was spent in counseling and coordination of care, reviewing test results, reviewing medications and treatment regimen and/or in discussing or reviewing the diagnosis, the prognosis and treatment options. Pertinent laboratory and imaging test results that were available during this  visit with the patient were reviewed by me and considered in my medical decision making (see chart for details).    Artice Last, MD Otolaryngology [facility] Phone: [phone] Fax: [phone]    03/01/2024, 12:28 PM

## 2024-03-01 NOTE — Patient Instructions (Signed)

## 2024-03-09 ENCOUNTER — Telehealth: Payer: Self-pay | Admitting: Family Medicine

## 2024-03-09 NOTE — Telephone Encounter (Signed)
 Placed in folder at nurse station

## 2024-03-09 NOTE — Telephone Encounter (Signed)
 Orders was sent from [facility] and needs signatures and sent back to its designated place. Paper work is placed in Chartered loss adjuster.  Please advise, Thanks

## 2024-03-10 NOTE — Telephone Encounter (Signed)
 Paperwork completed and to be placed in fax bin at back nurse station

## 2024-03-11 NOTE — Telephone Encounter (Signed)
Faxed back to the requested number

## 2024-03-17 ENCOUNTER — Encounter: Payer: Self-pay | Admitting: Internal Medicine

## 2024-03-17 ENCOUNTER — Ambulatory Visit: Admitting: Internal Medicine

## 2024-03-17 ENCOUNTER — Other Ambulatory Visit: Payer: Self-pay

## 2024-03-17 VITALS — BP 130/71 | HR 64 | Temp 97.8°F | Ht 71.0 in | Wt 185.0 lb

## 2024-03-17 DIAGNOSIS — A539 Syphilis, unspecified: Secondary | ICD-10-CM | POA: Diagnosis not present

## 2024-03-17 NOTE — Patient Instructions (Signed)
 Repeat rpr in 6 months. Rash is resolved F/u with id prn

## 2024-03-17 NOTE — Progress Notes (Unsigned)
 Patient: Peter Taylor  DOB: 09/12/1951 MRN: [id number] PCP: Benjiman Bras, MD  Referring Provider: ***  Chief Complaint  Patient presents with  . New Patient (Initial Visit)     Patient Active Problem List   Diagnosis Date Noted  . Thoracic back pain 09/12/2023  . Gastroesophageal reflux disease without esophagitis 01/16/2023  . Type 2 diabetes mellitus without complication, without long-term current use of insulin (HCC) 01/16/2023  . Thyroid  nodule 01/16/2023  . Elevated PSA 01/16/2023  . Benign prostatic hyperplasia 01/16/2023  . Dysphagia 08/01/2021  . Multiple thyroid  nodules 08/01/2021  . Coronary artery disease   . Hyperlipidemia 11/11/2019     Subjective:  Peter Taylor is a 73 y.o. @GENDER @ with   ROS  Past Medical History:  Diagnosis Date  . CAD (coronary artery disease), native coronary artery    a. Cath 12/10/19: 90% mLAD s/p DES, mild non obstructive disease in RCA and Cx  . GERD (gastroesophageal reflux disease)   . Hyperlipidemia   . Substance abuse (HCC)   . Tuberculosis     Outpatient Medications Prior to Visit  Medication Sig Dispense Refill  . Accu-Chek Softclix Lancets lancets daily.    . atorvastatin  (LIPITOR) 40 MG tablet TAKE 1 TABLET(40 MG) BY MOUTH DAILY 90 tablet 1  . Blood Glucose Monitoring Suppl DEVI 1 each by Does not apply route daily in the afternoon. May substitute to any manufacturer covered by patient's insurance. 1 each 0  . cetirizine  (ZYRTEC ) 10 MG tablet Take 1 tablet (10 mg total) by mouth daily. 30 tablet 11  . clopidogrel  (PLAVIX ) 75 MG tablet TAKE 1 TABLET(75 MG) BY MOUTH DAILY WITH BREAKFAST 90 tablet 1  . metFORMIN  (GLUCOPHAGE ) 500 MG tablet Take 1 tablet (500 mg total) by mouth daily with breakfast. 90 tablet 1  . pantoprazole  (PROTONIX ) 40 MG tablet Take 1 tablet (40 mg total) by mouth daily. 90 tablet 1  . tamsulosin  (FLOMAX ) 0.4 MG CAPS capsule Take 2 capsules (0.8 mg total) by mouth daily. TAKE 1  CAPSULE(0.4 MG) BY MOUTH DAILY 180 capsule 1  . tiZANidine  (ZANAFLEX ) 4 MG tablet Take 1 tablet (4 mg total) by mouth every 8 (eight) hours as needed for muscle spasms. Start once at bedtime. 30 tablet 0   No facility-administered medications prior to visit.     Allergies  Allergen Reactions  . Penicillins Hives    Did it involve swelling of the face/tongue/throat, SOB, or low BP? No Did it involve sudden or severe rash/hives, skin peeling, or any reaction on the inside of your mouth or nose? No Did you need to seek medical attention at a hospital or doctor's office? No When did it last happen?      35+ years If all above answers are "NO", may proceed with cephalosporin use.     Social History   Tobacco Use  . Smoking status: Former  . Smokeless tobacco: Former  Advertising account planner  . Vaping status: Never Used  Substance Use Topics  . Alcohol use: Yes    Comment: occ  . Drug use: No    Family History  Problem Relation Age of Onset  . Rectal cancer Mother   . Cancer Mother        unsure type  . Rectal cancer Sister   . Ovarian cancer Sister   . Ovarian cancer Sister   . Ovarian cancer Sister   . Cancer Brother        pancreatic  .  Colon cancer Maternal Uncle   . Stomach cancer Neg Hx     Objective:   Vitals:   03/17/24 1310  BP: 130/71  Pulse: 64  Temp: 97.8 F (36.6 C)  TempSrc: Oral  SpO2: 95%  Weight: 185 lb (83.9 kg)  Height: 5\' 11"  (1.803 m)   Body mass index is 25.8 kg/m.  Physical Exam  Lab Results: Lab Results  Component Value Date   WBC 6.3 03/17/2023   HGB 13.7 03/17/2023   HCT 40.4 03/17/2023   MCV 87.4 03/17/2023   PLT 231.0 03/17/2023    Lab Results  Component Value Date   CREATININE 1.10 01/05/2024   BUN 17 01/05/2024   NA 139 01/05/2024   K 3.8 01/05/2024   CL 103 01/05/2024   CO2 27 01/05/2024    Lab Results  Component Value Date   ALT 21 01/05/2024   AST 19 01/05/2024   ALKPHOS 126 (H) 01/05/2024   BILITOT 0.4 01/05/2024      Assessment & Plan:   Problem List Items Addressed This Visit   None #early cysphillis RAHS RESOLVED Sp doxy x 2 weeks RPR NEEDS TO PEATED 6 MONTHS *** will return to clinic in *** {weeks/months} for follow up  Orlie Bjornstad, MD [facility]   03/17/24  1:15 PM

## 2024-04-15 ENCOUNTER — Other Ambulatory Visit: Payer: Self-pay | Admitting: Family Medicine

## 2024-04-15 ENCOUNTER — Other Ambulatory Visit (HOSPITAL_COMMUNITY): Payer: Self-pay

## 2024-04-15 DIAGNOSIS — E119 Type 2 diabetes mellitus without complications: Secondary | ICD-10-CM

## 2024-05-28 DIAGNOSIS — R3912 Poor urinary stream: Secondary | ICD-10-CM | POA: Diagnosis not present

## 2024-06-04 ENCOUNTER — Other Ambulatory Visit: Payer: Self-pay | Admitting: Family Medicine

## 2024-06-04 DIAGNOSIS — K219 Gastro-esophageal reflux disease without esophagitis: Secondary | ICD-10-CM

## 2024-06-29 ENCOUNTER — Other Ambulatory Visit: Payer: Self-pay | Admitting: Family Medicine

## 2024-06-29 DIAGNOSIS — I25810 Atherosclerosis of coronary artery bypass graft(s) without angina pectoris: Secondary | ICD-10-CM

## 2024-06-29 MED ORDER — ATORVASTATIN CALCIUM 40 MG PO TABS: ORAL_TABLET | ORAL | 1 refills | Status: AC

## 2024-06-29 NOTE — Telephone Encounter (Signed)
 Copied from CRM [id number]. Topic: Clinical - Medication Refill >> Jun 29, 2024 12:56 PM Ahlexyia S wrote: Medication: atorvastatin  (LIPITOR) 40 MG tablet  Has the patient contacted their pharmacy? Yes, pharmacy told pt to contact office. (Agent: If no, request that the patient contact the pharmacy for the refill. If patient does not wish to contact the pharmacy document the reason why and proceed with request.) (Agent: If yes, when and what did the pharmacy advise?)  This is the patient's preferred pharmacy:  [id number] [place], [place] - [address] AT [facility] OF [facility] & [address] [place] [address] Phone: [phone] Fax: [phone]  Is this the correct pharmacy for this prescription? Yes If no, delete pharmacy and type the correct one.   Has the prescription been filled recently? No  Is the patient out of the medication? Yes  Has the patient been seen for an appointment in the last year OR does the patient have an upcoming appointment? Yes  Can we respond through MyChart? Yes  Agent: Please be advised that Rx refills may take up to 3 business days. We ask that you follow-up with your pharmacy.

## 2024-07-07 ENCOUNTER — Ambulatory Visit: Admitting: Family Medicine

## 2024-07-13 ENCOUNTER — Ambulatory Visit (INDEPENDENT_AMBULATORY_CARE_PROVIDER_SITE_OTHER)

## 2024-07-13 VITALS — Ht 71.0 in | Wt 187.0 lb

## 2024-07-13 DIAGNOSIS — Z0000 Encounter for general adult medical examination without abnormal findings: Secondary | ICD-10-CM

## 2024-07-13 NOTE — Progress Notes (Signed)
 Subjective:  Please attest and cosign this visit due to patients primary care provider not being in the office at the time the visit was completed.  (Pt of Dr Reyes Pines)   Peter Taylor is a 73 y.o. who presents for a Medicare Wellness preventive visit.  As a reminder, Annual Wellness Visits don't include a physical exam, and some assessments may be limited, especially if this visit is performed virtually. We may recommend an in-person follow-up visit with your provider if needed.  Visit Complete: Virtual I connected with  Peter Taylor on 07/13/24 by a audio enabled telemedicine application and verified that I am speaking with the correct person using two identifiers.  Patient Location: Home  Provider Location: Office/Clinic  I discussed the limitations of evaluation and management by telemedicine. The patient expressed understanding and agreed to proceed.  Vital Signs: Because this visit was a virtual/telehealth visit, some criteria may be missing or patient reported. Any vitals not documented were not able to be obtained and vitals that have been documented are patient reported.  VideoDeclined- This patient declined Librarian, academic. Therefore the visit was completed with audio only.  Persons Participating in Visit: Patient.  AWV Questionnaire: No: Patient Medicare AWV questionnaire was not completed prior to this visit.  Cardiac Risk Factors include: advanced age (>33men, >56 women);diabetes mellitus;dyslipidemia;male gender     Objective:    Today's Vitals   07/13/24 1330  Weight: 187 lb (84.8 kg)  Height: 5' 11 (1.803 m)   Body mass index is 26.08 kg/m.     07/13/2024    1:47 PM 09/09/2023    9:35 AM 08/18/2023    3:48 AM 04/24/2023    1:29 PM 06/14/2022    9:16 AM 04/18/2022    8:54 AM 04/04/2021    9:03 AM  Advanced Directives  Does Patient Have a Medical Advance Directive? No No No No No No No  Would patient like  information on creating a medical advance directive? No - Patient declined No - Patient declined No - Patient declined No - Patient declined No - Patient declined No - Patient declined No - Patient declined    Current Medications (verified) Outpatient Encounter Medications as of 07/13/2024  Medication Sig   Accu-Chek Softclix Lancets lancets daily.   Blood Glucose Monitoring Suppl DEVI 1 each by Does not apply route daily in the afternoon. May substitute to any manufacturer covered by patient's insurance.   cetirizine  (ZYRTEC ) 10 MG tablet Take 1 tablet (10 mg total) by mouth daily.   clopidogrel  (PLAVIX ) 75 MG tablet TAKE 1 TABLET(75 MG) BY MOUTH DAILY WITH BREAKFAST   metFORMIN  (GLUCOPHAGE ) 500 MG tablet TAKE 1 TABLET(500 MG) BY MOUTH DAILY WITH BREAKFAST   pantoprazole  (PROTONIX ) 40 MG tablet TAKE 1 TABLET(40 MG) BY MOUTH DAILY   tamsulosin  (FLOMAX ) 0.4 MG CAPS capsule Take 2 capsules (0.8 mg total) by mouth daily. TAKE 1 CAPSULE(0.4 MG) BY MOUTH DAILY   tiZANidine  (ZANAFLEX ) 4 MG tablet Take 1 tablet (4 mg total) by mouth every 8 (eight) hours as needed for muscle spasms. Start once at bedtime.   atorvastatin  (LIPITOR) 40 MG tablet TAKE 1 TABLET(40 MG) BY MOUTH DAILY   No facility-administered encounter medications on file as of 07/13/2024.    Allergies (verified) Penicillins   History: Past Medical History:  Diagnosis Date   CAD (coronary artery disease), native coronary artery    a. Cath 12/10/19: 90% mLAD s/p DES, mild non obstructive disease in RCA  and Cx   GERD (gastroesophageal reflux disease)    Hyperlipidemia    Substance abuse (HCC)    Tuberculosis    Past Surgical History:  Procedure Laterality Date   CORONARY STENT INTERVENTION N/A 12/10/2019   Procedure: CORONARY STENT INTERVENTION;  Surgeon: Dann Candyce RAMAN, MD;  Location: [facility] INVASIVE CV LAB;  Service: Cardiovascular;  Laterality: N/A;   LEFT HEART CATH AND CORONARY ANGIOGRAPHY N/A 12/10/2019   Procedure: LEFT  HEART CATH AND CORONARY ANGIOGRAPHY;  Surgeon: Dann Candyce RAMAN, MD;  Location: [facility] INVASIVE CV LAB;  Service: Cardiovascular;  Laterality: N/A;   NO PAST SURGERIES     Family History  Problem Relation Age of Onset   Rectal cancer Mother    Cancer Mother        unsure type   Rectal cancer Sister    Ovarian cancer Sister    Ovarian cancer Sister    Ovarian cancer Sister    Cancer Brother        pancreatic   Colon cancer Maternal Uncle    Stomach cancer Neg Hx    Social History   Socioeconomic History   Marital status: Single    Spouse name: Not on file   Number of children: 8   Years of education: Not on file   Highest education level: Not on file  Occupational History   Occupation: truck driver  Tobacco Use   Smoking status: Former   Smokeless tobacco: Former  Building services engineer status: Never Used  Substance and Sexual Activity   Alcohol use: Not Currently   Drug use: No   Sexual activity: Not Currently  Other Topics Concern   Not on file  Social History Narrative   Single   Social Drivers of Health   Financial Resource Strain: Low Risk  (07/13/2024)   Overall Financial Resource Strain (CARDIA)    Difficulty of Paying Living Expenses: Not hard at all  Food Insecurity: No Food Insecurity (07/13/2024)   Hunger Vital Sign    Worried About Running Out of Food in the Last Year: Never true    Ran Out of Food in the Last Year: Never true  Transportation Needs: No Transportation Needs (07/13/2024)   PRAPARE - Administrator, Civil Service (Medical): No    Lack of Transportation (Non-Medical): No  Physical Activity: Inactive (07/13/2024)   Exercise Vital Sign    Days of Exercise per Week: 0 days    Minutes of Exercise per Session: 0 min  Stress: No Stress Concern Present (07/13/2024)   [employer] of Occupational Health - Occupational Stress Questionnaire    Feeling of Stress: Not at all  Social Connections: Moderately Isolated (07/13/2024)   Social  Connection and Isolation Panel    Frequency of Communication with Friends and Family: More than three times a week    Frequency of Social Gatherings with Friends and Family: Never    Attends Religious Services: More than 4 times per year    Active Member of [employer] or Organizations: No    Attends Engineer, structural: Never    Marital Status: Never married    Tobacco Counseling Counseling given: No    Clinical Intake:  Pre-visit preparation completed: Yes  Pain : No/denies pain     BMI - recorded: 26.08 Nutritional Status: BMI 25 -29 Overweight Nutritional Risks: None Diabetes: Yes CBG done?: No Did pt. bring in CBG monitor from home?: No  Lab Results  Component Value Date  HGBA1C 7.6 (H) 01/05/2024   HGBA1C 7.1 (H) 07/07/2023   HGBA1C 7.0 (H) 01/16/2023     How often do you need to have someone help you when you read instructions, pamphlets, or other written materials from your doctor or pharmacy?: 1 - Never  Interpreter Needed?: No  Information entered by :: Verdie Saba, CMA   Activities of Daily Living     07/13/2024    1:33 PM  In your present state of health, do you have any difficulty performing the following activities:  Hearing? 0  Vision? 0  Difficulty concentrating or making decisions? 0  Walking or climbing stairs? 0  Dressing or bathing? 0  Doing errands, shopping? 0  Preparing Food and eating ? N  Using the Toilet? N  In the past six months, have you accidently leaked urine? N  Do you have problems with loss of bowel control? N  Managing your Medications? N  Managing your Finances? N  Housekeeping or managing your Housekeeping? N    Patient Care Team: Levora Reyes SAUNDERS, MD as PCP - General (Family Medicine) Raford Riggs, MD as PCP - Cardiology (Cardiology)  I have updated your Care Teams any recent Medical Services you may have received from other providers in the past year.     Assessment:   This is a routine wellness  examination for [place].  Hearing/Vision screen Hearing Screening - Comments:: Denies hearing difficulties   Vision Screening - Comments:: Denies vision concerns - plans to schedule an appt w/[facility]   Goals Addressed               This Visit's Progress     Patient Stated (pt-stated)        Patient stated he plans to continue taking meds daily - needs to exercise more       Depression Screen     07/13/2024    1:35 PM 12/05/2023   10:13 AM 07/07/2023   10:04 AM 04/24/2023    1:34 PM 04/03/2023    8:49 AM 02/06/2023    9:37 AM 01/16/2023   10:00 AM  PHQ 2/9 Scores  PHQ - 2 Score 0 0 0 0 0 0 0  PHQ- 9 Score 0  3 0 0 0 0    Fall Risk     07/13/2024    1:33 PM 03/17/2024    1:10 PM 12/05/2023   10:13 AM 09/10/2023   10:13 AM 07/07/2023   10:04 AM  Fall Risk   Falls in the past year? 1 0 0 1 0  Number falls in past yr: 0 0 0 0 0  Comment 1      Injury with Fall? 1 0 0 1 0  Comment back pain      Risk for fall due to :  No Fall Risks No Fall Risks No Fall Risks No Fall Risks  Follow up Falls evaluation completed;Falls prevention discussed Falls evaluation completed Falls evaluation completed Falls evaluation completed Falls evaluation completed    MEDICARE RISK AT HOME:  Medicare Risk at Home Any stairs in or around the home?: No If so, are there any without handrails?: No Home free of loose throw rugs in walkways, pet beds, electrical cords, etc?: Yes Adequate lighting in your home to reduce risk of falls?: Yes Life alert?: No Use of a cane, walker or w/c?: No Grab bars in the bathroom?: No Shower chair or bench in shower?: No Elevated toilet seat or a handicapped toilet?: No  TIMED UP AND GO:  Was the test performed?  No  Cognitive Function: 6CIT completed        07/13/2024    1:40 PM 04/24/2023    1:30 PM  6CIT Screen  What Year? 0 points 0 points  What month? 0 points 0 points  What time? 0 points 0 points  Count back from 20 0 points 2 points   Months in reverse 0 points 0 points  Repeat phrase 0 points 2 points  Total Score 0 points 4 points    Immunizations Immunization History  Administered Date(s) Administered   Fluad Trivalent(High Dose 65+) 01/05/2024   PFIZER(Purple Top)SARS-COV-2 Vaccination 01/01/2020, 01/22/2020, 10/03/2020   PNEUMOCOCCAL CONJUGATE-20 01/05/2024   Td 12/07/1962    Screening Tests Health Maintenance  Topic Date Due   DTaP/Tdap/Td (2 - Tdap) 07/08/1972   Zoster Vaccines- Shingrix (1 of 2) Never done   OPHTHALMOLOGY EXAM  02/05/2024   FOOT EXAM  02/06/2024   Influenza Vaccine  05/28/2024   COVID-19 Vaccine (4 - 2025-26 season) 06/28/2024   HEMOGLOBIN A1C  07/07/2024   Diabetic kidney evaluation - eGFR measurement  01/04/2025   Diabetic kidney evaluation - Urine ACR  01/04/2025   Medicare Annual Wellness (AWV)  07/13/2025   Colonoscopy  12/06/2026   Pneumococcal Vaccine: 50+ Years  Completed   Hepatitis C Screening  Completed   HPV VACCINES  Aged Out   Meningococcal B Vaccine  Aged Out    Health Maintenance Items Addressed:  07/13/2024  Additional Screening:  Vision Screening: Recommended annual ophthalmology exams for early detection of glaucoma and other disorders of the eye. Is the patient up to date with their annual eye exam?  No  Who is the provider or what is the name of the office in which the patient attends annual eye exams? Patient plans to schedule an appt for a Diabetic eye exam with [facility] in 2025.  Dental Screening: Recommended annual dental exams for proper oral hygiene  Community Resource Referral / Chronic Care Management: CRR required this visit?  No   CCM required this visit?  No   Plan:    I have personally reviewed and noted the following in the patient's chart:   Medical and social history Use of alcohol, tobacco or illicit drugs  Current medications and supplements including opioid prescriptions. Patient is not currently taking opioid  prescriptions. Functional ability and status Nutritional status Physical activity Advanced directives List of other physicians Hospitalizations, surgeries, and ER visits in previous 12 months Vitals Screenings to include cognitive, depression, and falls Referrals and appointments  In addition, I have reviewed and discussed with patient certain preventive protocols, quality metrics, and best practice recommendations. A written personalized care plan for preventive services as well as general preventive health recommendations were provided to patient.   Verdie CHRISTELLA Saba, CMA   07/13/2024   After Visit Summary: (MyChart) Due to this being a telephonic visit, the after visit summary with patients personalized plan was offered to patient via MyChart   Notes: Nothing significant to report at this time.

## 2024-07-13 NOTE — Patient Instructions (Addendum)
 [name],  Thank you for taking the time for your Medicare Wellness Visit. I appreciate your continued commitment to your health goals. Please review the care plan we discussed, and feel free to reach out if I can assist you further.  Medicare recommends these wellness visits once per year to help you and your care team stay ahead of potential health issues. These visits are designed to focus on prevention, allowing your provider to concentrate on managing your acute and chronic conditions during your regular appointments.  Please note that Annual Wellness Visits do not include a physical exam. Some assessments may be limited, especially if the visit was conducted virtually. If needed, we may recommend a separate in-person follow-up with your provider.  Ongoing Care Seeing your primary care provider every 3 to 6 months helps us  monitor your health and provide consistent, personalized care.   Referrals If a referral was made during today's visit and you haven't received any updates within two weeks, please contact the referred provider directly to check on the status.  Recommended Screenings:  Health Maintenance  Topic Date Due   DTaP/Tdap/Td vaccine (2 - Tdap) 07/08/1972   Zoster (Shingles) Vaccine (1 of 2) Never done   Eye exam for diabetics  02/05/2024   Complete foot exam   02/06/2024   Flu Shot  05/28/2024   COVID-19 Vaccine (4 - 2025-26 season) 06/28/2024   Hemoglobin A1C  07/07/2024   Yearly kidney function blood test for diabetes  01/04/2025   Yearly kidney health urinalysis for diabetes  01/04/2025   Medicare Annual Wellness Visit  07/13/2025   Colon Cancer Screening  12/06/2026   Pneumococcal Vaccine for age over 38  Completed   Hepatitis C Screening  Completed   HPV Vaccine  Aged Out   Meningitis B Vaccine  Aged Out       09/09/2023    9:35 AM  Advanced Directives  Does Patient Have a Medical Advance Directive? No  Would patient like information on creating a  medical advance directive? No - Patient declined   Advance Care Planning is important because it: Ensures you receive medical care that aligns with your values, goals, and preferences. Provides guidance to your family and loved ones, reducing the emotional burden of decision-making during critical moments.  Vision: Annual vision screenings are recommended for early detection of glaucoma, cataracts, and diabetic retinopathy. These exams can also reveal signs of chronic conditions such as diabetes and high blood pressure.  Dental: Annual dental screenings help detect early signs of oral cancer, gum disease, and other conditions linked to overall health, including heart disease and diabetes.

## 2024-07-26 ENCOUNTER — Ambulatory Visit: Payer: Self-pay | Admitting: Family Medicine

## 2024-07-26 ENCOUNTER — Encounter: Payer: Self-pay | Admitting: Family Medicine

## 2024-07-26 ENCOUNTER — Ambulatory Visit (INDEPENDENT_AMBULATORY_CARE_PROVIDER_SITE_OTHER)
Admission: RE | Admit: 2024-07-26 | Discharge: 2024-07-26 | Disposition: A | Discharge status: Home / Self Care | Source: Ambulatory Visit | Attending: Family Medicine | Admitting: Family Medicine

## 2024-07-26 ENCOUNTER — Ambulatory Visit: Admitting: Family Medicine

## 2024-07-26 VITALS — BP 130/66 | HR 56 | Temp 98.3°F | Resp 19 | Ht 71.0 in | Wt 194.2 lb

## 2024-07-26 DIAGNOSIS — Z23 Encounter for immunization: Secondary | ICD-10-CM | POA: Diagnosis not present

## 2024-07-26 DIAGNOSIS — R059 Cough, unspecified: Secondary | ICD-10-CM | POA: Diagnosis not present

## 2024-07-26 DIAGNOSIS — R140 Abdominal distension (gaseous): Secondary | ICD-10-CM | POA: Diagnosis not present

## 2024-07-26 DIAGNOSIS — E782 Mixed hyperlipidemia: Secondary | ICD-10-CM | POA: Diagnosis not present

## 2024-07-26 DIAGNOSIS — N400 Enlarged prostate without lower urinary tract symptoms: Secondary | ICD-10-CM | POA: Diagnosis not present

## 2024-07-26 DIAGNOSIS — A530 Latent syphilis, unspecified as early or late: Secondary | ICD-10-CM

## 2024-07-26 DIAGNOSIS — Z7984 Long term (current) use of oral hypoglycemic drugs: Secondary | ICD-10-CM

## 2024-07-26 DIAGNOSIS — E119 Type 2 diabetes mellitus without complications: Secondary | ICD-10-CM | POA: Diagnosis not present

## 2024-07-26 DIAGNOSIS — J9811 Atelectasis: Secondary | ICD-10-CM | POA: Diagnosis not present

## 2024-07-26 LAB — HEMOGLOBIN A1C: Hgb A1c MFr Bld: 7.5 % — ABNORMAL HIGH (ref 4.6–6.5)

## 2024-07-26 LAB — COMPREHENSIVE METABOLIC PANEL WITH GFR
ALT: 22 U/L (ref 0–53)
AST: 17 U/L (ref 0–37)
Albumin: 4.5 g/dL (ref 3.5–5.2)
Alkaline Phosphatase: 109 U/L (ref 39–117)
BUN: 13 mg/dL (ref 6–23)
CO2: 29 meq/L (ref 19–32)
Calcium: 9.8 mg/dL (ref 8.4–10.5)
Chloride: 104 meq/L (ref 96–112)
Creatinine, Ser: 1.2 mg/dL (ref 0.40–1.50)
GFR: 61.04 mL/min (ref >60.00)
Glucose, Bld: 105 mg/dL — ABNORMAL HIGH (ref 70–99)
Potassium: 4.2 meq/L (ref 3.5–5.1)
Sodium: 140 meq/L (ref 135–145)
Total Bilirubin: 0.5 mg/dL (ref 0.2–1.2)
Total Protein: 7.4 g/dL (ref 6.0–8.3)

## 2024-07-26 LAB — LIPID PANEL
Cholesterol: 127 mg/dL (ref 0–200)
HDL: 53.7 mg/dL (ref >39.00)
LDL Cholesterol: 55 mg/dL (ref 0–99)
NonHDL: 73.35
Total CHOL/HDL Ratio: 2
Triglycerides: 90 mg/dL (ref 0.0–149.0)
VLDL: 18 mg/dL (ref 0.0–40.0)

## 2024-07-26 MED ORDER — METFORMIN HCL ER 500 MG PO TB24
500.0000 mg | ORAL_TABLET | Freq: Every day | ORAL | 3 refills | Status: AC
Start: 2024-07-26 — End: ?

## 2024-07-26 NOTE — Patient Instructions (Addendum)
 Try changing to the extended release metformin  to see if that helps the bloating.  Will recheck in 1 month, but be seen if any new or worsening symptoms.  Continue heartburn medication daily.  If you are noticing new congestion or postnasal drip, you can try over-the-counter Flonase  as that can sometimes cause cough.  Continue heartburn medication daily as heartburn can also contribute to cough.  Please have chest x-ray performed at the [facility] location below.  If any worsening cough shortness of breath or persistent symptoms I would like to follow-up with you sooner than 1 month.  No other medication changes at this time.  Please call or stop by your eye specialist for an appointment, diabetic eye exam and make sure they send me a copy of that report.  Return to the clinic or go to the nearest emergency room if any of your symptoms worsen or new symptoms occur.  [facility] or xray: Walk in 8:30-4:30 during weekdays, no appointment needed [address]  [place], [place] [id number]

## 2024-07-26 NOTE — Progress Notes (Signed)
 Subjective:  Patient ID: Peter Taylor, male    DOB: [birth year]  Age: 73 y.o. MRN: [id number]  CC:  Chief Complaint  Patient presents with   Diabetes   Gastroesophageal Reflux    HPI Peter Taylor presents for   Diabetes: Complicated by hyperglycemia with increased A1c in March, and CAD with DES to LAD in February 2021.  Treated with Plavix .  Cardiology visit noted from April.  Continue on Plavix , atorvastatin .  1 year follow-up.no new chest pains.  Diabetes treated with diet/exercise approach only prior, now on metformin  500mg  daily since March, some bloating. No diarrhea.  Lipitor 40 mg daily for statin, no ACE or ARB.  Normal microalbumin ratio in March of last year. Microalbumin: nl ratio 01/05/24.  Optho, foot exam, pneumovax:  Due for optho eval - he will schedule appt.  Option of tdap at pharmacy.  Flu vaccine given here today.  Occasional cough at night.  Min phlegm at times. No fever, no DOE.    Lab Results  Component Value Date   HGBA1C 7.6 (H) 01/05/2024   HGBA1C 7.1 (H) 07/07/2023   HGBA1C 7.0 (H) 01/16/2023   Lab Results  Component Value Date   MICROALBUR 0.9 01/05/2024   LDLCALC 66 01/05/2024   CREATININE 1.10 01/05/2024   Syphilis See prior visits.  He did follow-up with infectious disease in May.  Genital rash resolved posttreatment with ceftriaxone  and doxycycline , asymptomatic at that time.  STD chemoprophylaxis declined.  Plan for repeat RPR in 6 months with PCP to monitor treatment efficacy then follow-up with infectious disease as needed.  Hyperlipidemia: Lipitor 40 mg daily.  Lab Results  Component Value Date   CHOL 130 01/05/2024   HDL 46.90 01/05/2024   LDLCALC 66 01/05/2024   TRIG 84.0 01/05/2024   CHOLHDL 3 01/05/2024   Lab Results  Component Value Date   ALT 21 01/05/2024   AST 19 01/05/2024   ALKPHOS 126 (H) 01/05/2024   BILITOT 0.4 01/05/2024   GERD Protonix  40 mg daily. Prior ENT evaluation with some findings consistent  with GERD and LPR.  Prior swallowing study without specific treatment recommended or SLP follow-up indicated.  Esophagram with evidence of hernia and retropulsion of barium through the esophagus.  Has been seen by GI, continues on Protonix  daily. Some bloating at times - past 3-4 months. After meals only, improves after passing gas. No belching.  GI eval in March. Endoscopy in April was unremarkable.   BPH Treated by urology previously, treated with Flomax .  Chronic stable PSA elevation followed by urology. Meds still working well. Appointment August 1 with urology.  PSA 6.14 on July 25, similar readings previously with 6.38 in April 2024.  1 year follow-up planned.  Continued on tamsulosin  twice daily, 180 with 3 refills ordered.   History Patient Active Problem List   Diagnosis Date Noted   Thoracic back pain 09/12/2023   Gastroesophageal reflux disease without esophagitis 01/16/2023   Type 2 diabetes mellitus without complication, without long-term current use of insulin (HCC) 01/16/2023   Thyroid  nodule 01/16/2023   Elevated PSA 01/16/2023   Benign prostatic hyperplasia 01/16/2023   Dysphagia 08/01/2021   Multiple thyroid  nodules 08/01/2021   Coronary artery disease    Hyperlipidemia 11/11/2019   Past Medical History:  Diagnosis Date   CAD (coronary artery disease), native coronary artery    a. Cath 12/10/19: 90% mLAD s/p DES, mild non obstructive disease in RCA and Cx   GERD (gastroesophageal reflux disease)  Hyperlipidemia    Substance abuse (HCC)    Tuberculosis    Past Surgical History:  Procedure Laterality Date   CORONARY STENT INTERVENTION N/A 12/10/2019   Procedure: CORONARY STENT INTERVENTION;  Surgeon: Dann Candyce RAMAN, MD;  Location: [facility] INVASIVE CV LAB;  Service: Cardiovascular;  Laterality: N/A;   LEFT HEART CATH AND CORONARY ANGIOGRAPHY N/A 12/10/2019   Procedure: LEFT HEART CATH AND CORONARY ANGIOGRAPHY;  Surgeon: Dann Candyce RAMAN, MD;  Location: [facility]  INVASIVE CV LAB;  Service: Cardiovascular;  Laterality: N/A;   NO PAST SURGERIES     Allergies  Allergen Reactions   Penicillins Hives    Did it involve swelling of the face/tongue/throat, SOB, or low BP? No Did it involve sudden or severe rash/hives, skin peeling, or any reaction on the inside of your mouth or nose? No Did you need to seek medical attention at a hospital or doctor's office? No When did it last happen?      35+ years If all above answers are NO, may proceed with cephalosporin use.    Prior to Admission medications   Medication Sig Start Date End Date Taking? Authorizing Provider  Accu-Chek Softclix Lancets lancets daily. 08/27/23  Yes [provider]  atorvastatin  (LIPITOR) 40 MG tablet TAKE 1 TABLET(40 MG) BY MOUTH DAILY 06/29/24  Yes Levora Reyes SAUNDERS, MD  Blood Glucose Monitoring Suppl DEVI 1 each by Does not apply route daily in the afternoon. May substitute to any manufacturer covered by patient's insurance. 08/27/23  Yes Levora Reyes SAUNDERS, MD  cetirizine  (ZYRTEC ) 10 MG tablet Take 1 tablet (10 mg total) by mouth daily. 12/01/23  Yes Soldatova, Liuba, MD  clopidogrel  (PLAVIX ) 75 MG tablet TAKE 1 TABLET(75 MG) BY MOUTH DAILY WITH BREAKFAST 01/05/24  Yes Levora Reyes SAUNDERS, MD  metFORMIN  (GLUCOPHAGE ) 500 MG tablet TAKE 1 TABLET(500 MG) BY MOUTH DAILY WITH BREAKFAST 04/15/24  Yes Levora Reyes SAUNDERS, MD  pantoprazole  (PROTONIX ) 40 MG tablet TAKE 1 TABLET(40 MG) BY MOUTH DAILY 06/04/24  Yes Levora Reyes SAUNDERS, MD  tamsulosin  (FLOMAX ) 0.4 MG CAPS capsule Take 2 capsules (0.8 mg total) by mouth daily. TAKE 1 CAPSULE(0.4 MG) BY MOUTH DAILY 01/05/24  Yes Levora Reyes SAUNDERS, MD  tiZANidine  (ZANAFLEX ) 4 MG tablet Take 1 tablet (4 mg total) by mouth every 8 (eight) hours as needed for muscle spasms. Start once at bedtime. 09/22/23  Yes Levora Reyes SAUNDERS, MD   Social History   Socioeconomic History   Marital status: Single    Spouse name: Not on file   Number of children: 8    Years of education: Not on file   Highest education level: Not on file  Occupational History   Occupation: truck driver  Tobacco Use   Smoking status: Former   Smokeless tobacco: Former  Building services engineer status: Never Used  Substance and Sexual Activity   Alcohol use: Not Currently   Drug use: No   Sexual activity: Not Currently  Other Topics Concern   Not on file  Social History Narrative   Single   Social Drivers of Health   Financial Resource Strain: Low Risk  (07/13/2024)   Overall Financial Resource Strain (CARDIA)    Difficulty of Paying Living Expenses: Not hard at all  Food Insecurity: No Food Insecurity (07/13/2024)   Hunger Vital Sign    Worried About Running Out of Food in the Last Year: Never true    Ran Out of Food in the Last Year: Never true  Transportation  Needs: No Transportation Needs (07/13/2024)   PRAPARE - Administrator, Civil Service (Medical): No    Lack of Transportation (Non-Medical): No  Physical Activity: Inactive (07/13/2024)   Exercise Vital Sign    Days of Exercise per Week: 0 days    Minutes of Exercise per Session: 0 min  Stress: No Stress Concern Present (07/13/2024)   [employer] of Occupational Health - Occupational Stress Questionnaire    Feeling of Stress: Not at all  Social Connections: Moderately Isolated (07/13/2024)   Social Connection and Isolation Panel    Frequency of Communication with Friends and Family: More than three times a week    Frequency of Social Gatherings with Friends and Family: Never    Attends Religious Services: More than 4 times per year    Active Member of [employer] or Organizations: No    Attends Banker Meetings: Never    Marital Status: Never married  Intimate Partner Violence: Not At Risk (07/13/2024)   Humiliation, Afraid, Rape, and Kick questionnaire    Fear of Current or Ex-Partner: No    Emotionally Abused: No    Physically Abused: No    Sexually Abused: No    Review of  Systems  Constitutional:  Negative for fatigue and unexpected weight change.  Eyes:  Negative for visual disturbance.  Respiratory:  Negative for cough, chest tightness and shortness of breath.   Cardiovascular:  Negative for chest pain, palpitations and leg swelling.  Gastrointestinal:  Negative for abdominal pain and blood in stool.  Neurological:  Negative for dizziness, light-headedness and headaches.     Objective:   Vitals:   07/26/24 0841  BP: 130/66  Pulse: (!) 56  Resp: 19  Temp: 98.3 F (36.8 C)  TempSrc: Temporal  SpO2: 98%  Weight: 194 lb 3.2 oz (88.1 kg)  Height: 5' 11 (1.803 m)     Physical Exam Vitals reviewed.  Constitutional:      Appearance: He is well-developed.  HENT:     Head: Normocephalic and atraumatic.  Neck:     Vascular: No carotid bruit or JVD.  Cardiovascular:     Rate and Rhythm: Normal rate and regular rhythm.     Heart sounds: Normal heart sounds. No murmur heard. Pulmonary:     Effort: Pulmonary effort is normal.     Breath sounds: Rales (Few coarse expiratory breath sounds diffusely but no wheeze, normal effort, no distress.) present.  Abdominal:     General: Abdomen is flat. There is no distension.     Tenderness: There is no abdominal tenderness. There is no guarding.  Musculoskeletal:     Right lower leg: No edema.     Left lower leg: No edema.  Skin:    General: Skin is warm and dry.  Neurological:     Mental Status: He is alert and oriented to person, place, and time.  Psychiatric:        Mood and Affect: Mood normal.        Assessment & Plan:  Peter Taylor is a 73 y.o. male . Type 2 diabetes mellitus without complication, without long-term current use of insulin (HCC) - Plan: Hemoglobin A1c, metFORMIN  (GLUCOPHAGE -XR) 500 MG 24 hr tablet  - Will try changing metformin  to extended release formulation to see if less bloating, 1 month follow-up.  Check A1c and adjust plan accordingly.  Benign prostatic hyperplasia,  unspecified whether lower urinary tract symptoms present  - Stable with tamsulosin , refills should be available  from urology as above.  Need for influenza vaccination - Plan: Flu vaccine HIGH DOSE PF(Fluzone Trivalent)  Cough, unspecified type - Plan: DG Chest 2 View  - Intermittent cough, few coarse breath sounds on exam.  Upper airway cough possible but reflux is controlled.  Option of over-the-counter Flonase  for possible postnasal drip component.  Check chest x-ray, recheck in 1 month with RTC precautions if new or worsening symptoms sooner.  Mixed hyperlipidemia - Plan: Comprehensive metabolic panel with GFR, Lipid panel  - Tolerating current dose statin, check labs, continue same regimen.  Positive serology for syphilis - Plan: RPR  - As above per ID recommendations will check RPR for response, with as needed follow-up with infectious disease.  Abdominal bloating  - Intermittent as above, trial of change in metformin  formulation and recheck 1 month.  Reassuring endoscopy recently with GI.  Meds ordered this encounter  Medications   metFORMIN  (GLUCOPHAGE -XR) 500 MG 24 hr tablet    Sig: Take 1 tablet (500 mg total) by mouth daily with breakfast.    Dispense:  30 tablet    Refill:  3   Patient Instructions  Try changing to the extended release metformin  to see if that helps the bloating.  Will recheck in 1 month, but be seen if any new or worsening symptoms.  Continue heartburn medication daily.  If you are noticing new congestion or postnasal drip, you can try over-the-counter Flonase  as that can sometimes cause cough.  Continue heartburn medication daily as heartburn can also contribute to cough.  Please have chest x-ray performed at the [facility] location below.  If any worsening cough shortness of breath or persistent symptoms I would like to follow-up with you sooner than 1 month.  No other medication changes at this time.  Please call or stop by your eye specialist for an  appointment, diabetic eye exam and make sure they send me a copy of that report.  Return to the clinic or go to the nearest emergency room if any of your symptoms worsen or new symptoms occur.  [facility] or xray: Walk in 8:30-4:30 during weekdays, no appointment needed [address]  [place], [place] [id number]     Signed,   Reyes Pines, MD [facility], [facility] 07/26/24 9:53 AM

## 2024-07-28 LAB — RPR: RPR Ser Ql: REACTIVE — AB

## 2024-07-28 LAB — RPR TITER: RPR Titer: 1:8 {titer} — ABNORMAL HIGH

## 2024-07-28 LAB — T PALLIDUM AB: T Pallidum Abs: POSITIVE — AB

## 2024-08-02 NOTE — Progress Notes (Signed)
 Lab results have been discussed.   Verbalized understanding? Yes  Are there any questions? No

## 2024-08-27 ENCOUNTER — Ambulatory Visit: Admitting: Family Medicine

## 2024-08-30 ENCOUNTER — Encounter: Payer: Self-pay | Admitting: Radiology

## 2024-08-30 ENCOUNTER — Ambulatory Visit (INDEPENDENT_AMBULATORY_CARE_PROVIDER_SITE_OTHER): Admitting: Family Medicine

## 2024-08-30 ENCOUNTER — Encounter: Payer: Self-pay | Admitting: Family Medicine

## 2024-08-30 VITALS — BP 128/78 | HR 76 | Temp 97.9°F | Resp 15 | Ht 71.0 in | Wt 195.2 lb

## 2024-08-30 DIAGNOSIS — R059 Cough, unspecified: Secondary | ICD-10-CM

## 2024-08-30 DIAGNOSIS — R140 Abdominal distension (gaseous): Secondary | ICD-10-CM | POA: Diagnosis not present

## 2024-08-30 NOTE — Progress Notes (Signed)
 Subjective:  Patient ID: Peter Taylor, male    DOB: 08/15/1951  Age: 73 y.o. MRN: [id number]  CC:  Chief Complaint  Patient presents with   Cough    Pt notes doing better for the most part with coughing    Bloated    Notes bloating has remained a consistent issue, no real change since last visit other than more regularity to bowel movements     HPI Peter Taylor presents for   Intermittent cough Discussed at his September 29 visit.  Few coarse breath sounds on exam, chest x-ray obtained without acute findings, active cardiopulmonary disease.  Minimal left lung base atelectasis.  Upper airway cough possible but reflux was controlled.  Over-the-counter Flonase  as option for possible postnasal drip component.  Since last visit symptoms have improved. Using flonase  once per day in the morning. Comes and goes- notes with cool air at times. Off spray for few days as breathing better.    Abdominal bloating Intermittent symptoms discussed at his September visit.  He does take metformin  and we initially changed to the extended release formulation to see if that lessened the bloating. Metfomin XR has been doing better.  Some constipation before. Better now. Bowel movement daily.  No diarrhea. Bloating is a lot better.  Carbonated beverages - 2 per day, soda.  No abd pain, f/c, n/v, no stool changes, weight loss or night sweats.  Colonoscopy in 2021, repeat in 7 years.  Limited/RUQ us  in April.        History Patient Active Problem List   Diagnosis Date Noted   Thoracic back pain 09/12/2023   Gastroesophageal reflux disease without esophagitis 01/16/2023   Type 2 diabetes mellitus without complication, without long-term current use of insulin (HCC) 01/16/2023   Thyroid  nodule 01/16/2023   Elevated PSA 01/16/2023   Benign prostatic hyperplasia 01/16/2023   Dysphagia 08/01/2021   Multiple thyroid  nodules 08/01/2021   Coronary artery disease    Hyperlipidemia 11/11/2019   Past  Medical History:  Diagnosis Date   CAD (coronary artery disease), native coronary artery    a. Cath 12/10/19: 90% mLAD s/p DES, mild non obstructive disease in RCA and Cx   GERD (gastroesophageal reflux disease)    Hyperlipidemia    Substance abuse (HCC)    Tuberculosis    Past Surgical History:  Procedure Laterality Date   CORONARY STENT INTERVENTION N/A 12/10/2019   Procedure: CORONARY STENT INTERVENTION;  Surgeon: Dann Candyce RAMAN, MD;  Location: [facility] INVASIVE CV LAB;  Service: Cardiovascular;  Laterality: N/A;   LEFT HEART CATH AND CORONARY ANGIOGRAPHY N/A 12/10/2019   Procedure: LEFT HEART CATH AND CORONARY ANGIOGRAPHY;  Surgeon: Dann Candyce RAMAN, MD;  Location: [facility] INVASIVE CV LAB;  Service: Cardiovascular;  Laterality: N/A;   NO PAST SURGERIES     Allergies  Allergen Reactions   Penicillins Hives    Did it involve swelling of the face/tongue/throat, SOB, or low BP? No Did it involve sudden or severe rash/hives, skin peeling, or any reaction on the inside of your mouth or nose? No Did you need to seek medical attention at a hospital or doctor's office? No When did it last happen?      35+ years If all above answers are NO, may proceed with cephalosporin use.    Prior to Admission medications   Medication Sig Start Date End Date Taking? Authorizing Provider  Accu-Chek Softclix Lancets lancets daily. 08/27/23  Yes [provider]  atorvastatin  (LIPITOR) 40 MG tablet TAKE  1 TABLET(40 MG) BY MOUTH DAILY 06/29/24  Yes Levora Reyes SAUNDERS, MD  Blood Glucose Monitoring Suppl DEVI 1 each by Does not apply route daily in the afternoon. May substitute to any manufacturer covered by patient's insurance. 08/27/23  Yes Levora Reyes SAUNDERS, MD  cetirizine  (ZYRTEC ) 10 MG tablet Take 1 tablet (10 mg total) by mouth daily. 12/01/23  Yes Soldatova, Liuba, MD  clopidogrel  (PLAVIX ) 75 MG tablet TAKE 1 TABLET(75 MG) BY MOUTH DAILY WITH BREAKFAST 01/05/24  Yes Levora Reyes SAUNDERS, MD  metFORMIN   (GLUCOPHAGE -XR) 500 MG 24 hr tablet Take 1 tablet (500 mg total) by mouth daily with breakfast. 07/26/24  Yes Levora Reyes SAUNDERS, MD  pantoprazole  (PROTONIX ) 40 MG tablet TAKE 1 TABLET(40 MG) BY MOUTH DAILY 06/04/24  Yes Levora Reyes SAUNDERS, MD  tamsulosin  (FLOMAX ) 0.4 MG CAPS capsule Take 2 capsules (0.8 mg total) by mouth daily. TAKE 1 CAPSULE(0.4 MG) BY MOUTH DAILY 01/05/24  Yes Levora Reyes SAUNDERS, MD  tiZANidine  (ZANAFLEX ) 4 MG tablet Take 1 tablet (4 mg total) by mouth every 8 (eight) hours as needed for muscle spasms. Start once at bedtime. 09/22/23  Yes Levora Reyes SAUNDERS, MD   Social History   Socioeconomic History   Marital status: Single    Spouse name: Not on file   Number of children: 8   Years of education: Not on file   Highest education level: Not on file  Occupational History   Occupation: truck driver  Tobacco Use   Smoking status: Former   Smokeless tobacco: Former  Building Services Engineer status: Never Used  Substance and Sexual Activity   Alcohol use: Not Currently   Drug use: No   Sexual activity: Not Currently  Other Topics Concern   Not on file  Social History Narrative   Single   Social Drivers of Health   Financial Resource Strain: Low Risk  (07/13/2024)   Overall Financial Resource Strain (CARDIA)    Difficulty of Paying Living Expenses: Not hard at all  Food Insecurity: No Food Insecurity (07/13/2024)   Hunger Vital Sign    Worried About Running Out of Food in the Last Year: Never true    Ran Out of Food in the Last Year: Never true  Transportation Needs: No Transportation Needs (07/13/2024)   PRAPARE - Administrator, Civil Service (Medical): No    Lack of Transportation (Non-Medical): No  Physical Activity: Inactive (07/13/2024)   Exercise Vital Sign    Days of Exercise per Week: 0 days    Minutes of Exercise per Session: 0 min  Stress: No Stress Concern Present (07/13/2024)   [employer] of Occupational Health - Occupational Stress  Questionnaire    Feeling of Stress: Not at all  Social Connections: Moderately Isolated (07/13/2024)   Social Connection and Isolation Panel    Frequency of Communication with Friends and Family: More than three times a week    Frequency of Social Gatherings with Friends and Family: Never    Attends Religious Services: More than 4 times per year    Active Member of [employer] or Organizations: No    Attends Banker Meetings: Never    Marital Status: Never married  Intimate Partner Violence: Not At Risk (07/13/2024)   Humiliation, Afraid, Rape, and Kick questionnaire    Fear of Current or Ex-Partner: No    Emotionally Abused: No    Physically Abused: No    Sexually Abused: No    Review of Systems  Objective:   Vitals:   08/30/24 1007  BP: 128/78  Pulse: 76  Resp: 15  Temp: 97.9 F (36.6 C)  TempSrc: Temporal  SpO2: 98%  Weight: 195 lb 3.2 oz (88.5 kg)  Height: 5' 11 (1.803 m)     Physical Exam Vitals reviewed.  Constitutional:      Appearance: He is well-developed.  HENT:     Head: Normocephalic and atraumatic.  Neck:     Vascular: No carotid bruit or JVD.  Cardiovascular:     Rate and Rhythm: Normal rate and regular rhythm.     Heart sounds: Normal heart sounds. No murmur heard. Pulmonary:     Effort: Pulmonary effort is normal. No respiratory distress.     Breath sounds: Normal breath sounds. No stridor. No wheezing, rhonchi or rales.  Abdominal:     General: Bowel sounds are normal.     Tenderness: There is no abdominal tenderness. There is no guarding or rebound.     Comments: Protuberant abdomen, but nontender.  Musculoskeletal:     Right lower leg: No edema.     Left lower leg: No edema.  Skin:    General: Skin is warm and dry.  Neurological:     Mental Status: He is alert and oriented to person, place, and time.  Psychiatric:        Mood and Affect: Mood normal.      Assessment & Plan:  Peter Taylor is a 73 y.o. male . Cough,  unspecified type  - Improved with Flonase , indicating likely postnasal drip, upper airway cough.  Continue Flonase  daily as needed with RTC precautions given.  Abdominal bloating - Plan: US  Abdomen Limited RUQ (LIVER/GB)  - Prominent, protuberant abdomen but nontender.  He does report that subjectively bloating has improved with use of metformin  and more regular bowel movements but still some bloating.  Will check ultrasound, but also advised him to cut back on carbonated beverages, sodas as this could contribute to bloating.  RTC precautions, 8-month follow-up, can adjust plan depending on ultrasound results.  No orders of the defined types were placed in this encounter.  Patient Instructions  Glad to hear that cough is better. Ok to use flonase  daily.  Glad to hear that the abdominal bloating has improved, but I still think an ultrasound would be helpful to look at any sign of extra fluid in the belly or other causes of bloating.  As we discussed I would like you to cut back on the carbonated beverages and sodas as this can also contribute to bloating.  If not continue to improve, let me know and I can have gastroenterology see you.  No change in metformin  dose for now.  Follow-up in 2 months for recheck labs.      Signed,   Reyes Pines, MD [facility], [facility] 08/30/24 10:52 AM

## 2024-08-30 NOTE — Patient Instructions (Addendum)
 Glad to hear that cough is better. Ok to use flonase  daily.  Glad to hear that the abdominal bloating has improved, but I still think an ultrasound would be helpful to look at any sign of extra fluid in the belly or other causes of bloating.  As we discussed I would like you to cut back on the carbonated beverages and sodas as this can also contribute to bloating.  If not continue to improve, let me know and I can have gastroenterology see you.  No change in metformin  dose for now.  Follow-up in 2 months for recheck labs.

## 2024-09-09 ENCOUNTER — Ambulatory Visit
Admission: RE | Admit: 2024-09-09 | Discharge: 2024-09-09 | Disposition: A | Discharge status: Home / Self Care | Source: Ambulatory Visit | Attending: Family Medicine | Admitting: Family Medicine

## 2024-09-09 DIAGNOSIS — R140 Abdominal distension (gaseous): Secondary | ICD-10-CM

## 2024-09-10 ENCOUNTER — Ambulatory Visit: Payer: Self-pay | Admitting: Family Medicine

## 2024-09-16 NOTE — Progress Notes (Signed)
  Repeat syphilis test has improved.  Initial titer was 1:64, now 1:8.  This indicates an adequate response to treatment.  Let me know if there are questions.   Dr. Levora

## 2024-09-16 NOTE — Progress Notes (Signed)
 Pt has been notified.

## 2024-11-01 ENCOUNTER — Encounter: Payer: Self-pay | Admitting: Family Medicine

## 2024-11-01 ENCOUNTER — Ambulatory Visit (INDEPENDENT_AMBULATORY_CARE_PROVIDER_SITE_OTHER): Admitting: Family Medicine

## 2024-11-01 VITALS — BP 100/58 | HR 71 | Temp 98.2°F | Resp 14 | Ht 71.0 in | Wt 199.0 lb

## 2024-11-01 DIAGNOSIS — E1165 Type 2 diabetes mellitus with hyperglycemia: Secondary | ICD-10-CM

## 2024-11-01 DIAGNOSIS — Z7984 Long term (current) use of oral hypoglycemic drugs: Secondary | ICD-10-CM | POA: Diagnosis not present

## 2024-11-01 DIAGNOSIS — R140 Abdominal distension (gaseous): Secondary | ICD-10-CM

## 2024-11-01 DIAGNOSIS — E119 Type 2 diabetes mellitus without complications: Secondary | ICD-10-CM

## 2024-11-01 LAB — COMPREHENSIVE METABOLIC PANEL WITH GFR
ALT: 23 U/L (ref 3–53)
AST: 18 U/L (ref 5–37)
Albumin: 4.4 g/dL (ref 3.5–5.2)
Alkaline Phosphatase: 118 U/L — ABNORMAL HIGH (ref 39–117)
BUN: 15 mg/dL (ref 6–23)
CO2: 29 meq/L (ref 19–32)
Calcium: 9.2 mg/dL (ref 8.4–10.5)
Chloride: 103 meq/L (ref 96–112)
Creatinine, Ser: 1.22 mg/dL (ref 0.40–1.50)
GFR: 59.73 mL/min — ABNORMAL LOW
Glucose, Bld: 126 mg/dL — ABNORMAL HIGH (ref 70–99)
Potassium: 3.9 meq/L (ref 3.5–5.1)
Sodium: 140 meq/L (ref 135–145)
Total Bilirubin: 0.6 mg/dL (ref 0.2–1.2)
Total Protein: 7.1 g/dL (ref 6.0–8.3)

## 2024-11-01 LAB — HEMOGLOBIN A1C: Hgb A1c MFr Bld: 7 % — ABNORMAL HIGH (ref 4.6–6.5)

## 2024-11-01 NOTE — Patient Instructions (Addendum)
 No change in meds for now.  We may need to stop metformin  and try something different for diabetes if that is contributing to some of the bowel changes, but I will also refer you to gastroenterology due to the persistent bloating symptoms.  I would like to see the results of your labs prior to changing medications.  If you have not heard from the gastroenterology office about appointment within the next week or 2 let me know.  Be seen if any new or worsening symptoms.  COVID booster and Tdap vaccines can be given at your pharmacy.  Let me know if there are questions and take care!

## 2024-11-01 NOTE — Progress Notes (Signed)
 "  Subjective:  Patient ID: Peter Taylor, male    DOB: 05-27-1951  Age: 74 y.o. MRN: [id number]  CC:  Chief Complaint  Patient presents with   Follow-up    2 month follow up on DM. Doing well on the new Metformin . He is not using the flonase  that was recommended to him at last visit.   Patient has eye exam a couple months ago. Found eye doctor and requested last eye exam. Patient will get COVID and Tdap at pharmacy    HPI Peter Taylor presents for   Diabetes: Complicated by hyperglycemia, CAD (DES to the LAD in February 2021, on Plavix ).  Bloating noted with metformin  500 mg since March of last year.  Changed to extended release formulation in September.  Improved at follow-up November 3, prior constipation had improved.  No diarrhea and bloating had improved with normal bowel movements daily. Did have prominent, protuberant abdomen but nontender at his November 3 visit.  Although symptomatically improved at that time, ultrasound was ordered. Right upper quadrant limited ultrasound on 09/09/2024, with 1.2 cm echogenic mass of the left hepatic lobe smaller compared to ultrasound in October 2024 favoring benign etiology most likely hemangioma with no new hepatic lesions.  Still having some bloating, gas, stomach feels hard after eating then improves after passing gas - every meal. No vomiting. Minimal diarrhea at times -every other day.  Constipation about every 2 weeks.  No fever, eating/drinking ok.  GI eval for heartburn/dysphagia in March 2025. No recent heartburn or swallowing difficulties.   No recent home readings.   Microalbumin: nl ratio 01/05/24.  Optho, foot exam, pneumovax:  Had visit with Optho, requesting exam.  Plans on COVID booster and Tdap at his pharmacy. Lab Results  Component Value Date   HGBA1C 7.5 (H) 07/26/2024   HGBA1C 7.6 (H) 01/05/2024   HGBA1C 7.1 (H) 07/07/2023   Lab Results  Component Value Date   MICROALBUR 0.9 01/05/2024   LDLCALC 55 07/26/2024    CREATININE 1.20 07/26/2024    History Patient Active Problem List   Diagnosis Date Noted   Thoracic back pain 09/12/2023   Gastroesophageal reflux disease without esophagitis 01/16/2023   Type 2 diabetes mellitus without complication, without long-term current use of insulin (HCC) 01/16/2023   Thyroid  nodule 01/16/2023   Elevated PSA 01/16/2023   Benign prostatic hyperplasia 01/16/2023   Dysphagia 08/01/2021   Multiple thyroid  nodules 08/01/2021   Coronary artery disease    Hyperlipidemia 11/11/2019   Past Medical History:  Diagnosis Date   CAD (coronary artery disease), native coronary artery    a. Cath 12/10/19: 90% mLAD s/p DES, mild non obstructive disease in RCA and Cx   GERD (gastroesophageal reflux disease)    Hyperlipidemia    Substance abuse (HCC)    Tuberculosis    Past Surgical History:  Procedure Laterality Date   CORONARY STENT INTERVENTION N/A 12/10/2019   Procedure: CORONARY STENT INTERVENTION;  Surgeon: Dann Candyce RAMAN, MD;  Location: [facility] INVASIVE CV LAB;  Service: Cardiovascular;  Laterality: N/A;   LEFT HEART CATH AND CORONARY ANGIOGRAPHY N/A 12/10/2019   Procedure: LEFT HEART CATH AND CORONARY ANGIOGRAPHY;  Surgeon: Dann Candyce RAMAN, MD;  Location: [facility] INVASIVE CV LAB;  Service: Cardiovascular;  Laterality: N/A;   NO PAST SURGERIES     Allergies[1] Prior to Admission medications  Medication Sig Start Date End Date Taking? Authorizing Provider  Accu-Chek Softclix Lancets lancets daily. 08/27/23  Yes [provider]  atorvastatin  (LIPITOR) 40 MG  tablet TAKE 1 TABLET(40 MG) BY MOUTH DAILY 06/29/24  Yes Levora Reyes SAUNDERS, MD  Blood Glucose Monitoring Suppl DEVI 1 each by Does not apply route daily in the afternoon. May substitute to any manufacturer covered by patient's insurance. 08/27/23  Yes Levora Reyes SAUNDERS, MD  cetirizine  (ZYRTEC ) 10 MG tablet Take 1 tablet (10 mg total) by mouth daily. 12/01/23  Yes Soldatova, [place], MD  clopidogrel  (PLAVIX )  75 MG tablet TAKE 1 TABLET(75 MG) BY MOUTH DAILY WITH BREAKFAST 01/05/24  Yes Levora Reyes SAUNDERS, MD  metFORMIN  (GLUCOPHAGE -XR) 500 MG 24 hr tablet Take 1 tablet (500 mg total) by mouth daily with breakfast. 07/26/24  Yes Levora Reyes SAUNDERS, MD  pantoprazole  (PROTONIX ) 40 MG tablet TAKE 1 TABLET(40 MG) BY MOUTH DAILY 06/04/24  Yes Levora Reyes SAUNDERS, MD  tamsulosin  (FLOMAX ) 0.4 MG CAPS capsule Take 2 capsules (0.8 mg total) by mouth daily. TAKE 1 CAPSULE(0.4 MG) BY MOUTH DAILY 01/05/24  Yes Levora Reyes SAUNDERS, MD  tiZANidine  (ZANAFLEX ) 4 MG tablet Take 1 tablet (4 mg total) by mouth every 8 (eight) hours as needed for muscle spasms. Start once at bedtime. 09/22/23  Yes Levora Reyes SAUNDERS, MD   Social History   Socioeconomic History   Marital status: Single    Spouse name: Not on file   Number of children: 8   Years of education: Not on file   Highest education level: Not on file  Occupational History   Occupation: truck driver  Tobacco Use   Smoking status: Former   Smokeless tobacco: Former  Building Services Engineer status: Never Used  Substance and Sexual Activity   Alcohol use: Not Currently   Drug use: No   Sexual activity: Not Currently  Other Topics Concern   Not on file  Social History Narrative   Single   Social Drivers of Health   Tobacco Use: Medium Risk (11/01/2024)   Patient History    Smoking Tobacco Use: Former    Smokeless Tobacco Use: Former    Passive Exposure: Not on Actuary Strain: Low Risk (07/13/2024)   Overall Financial Resource Strain (CARDIA)    Difficulty of Paying Living Expenses: Not hard at all  Food Insecurity: No Food Insecurity (07/13/2024)   Epic    Worried About Programme Researcher, Broadcasting/film/video in the Last Year: Never true    Ran Out of Food in the Last Year: Never true  Transportation Needs: No Transportation Needs (07/13/2024)   Epic    Lack of Transportation (Medical): No    Lack of Transportation (Non-Medical): No  Physical Activity: Inactive  (07/13/2024)   Exercise Vital Sign    Days of Exercise per Week: 0 days    Minutes of Exercise per Session: 0 min  Stress: No Stress Concern Present (07/13/2024)   [employer] of Occupational Health - Occupational Stress Questionnaire    Feeling of Stress: Not at all  Social Connections: Moderately Isolated (07/13/2024)   Social Connection and Isolation Panel    Frequency of Communication with Friends and Family: More than three times a week    Frequency of Social Gatherings with Friends and Family: Never    Attends Religious Services: More than 4 times per year    Active Member of [employer] or Organizations: No    Attends Banker Meetings: Never    Marital Status: Never married  Intimate Partner Violence: Not At Risk (07/13/2024)   Epic    Fear of Current or Ex-Partner: No  Emotionally Abused: No    Physically Abused: No    Sexually Abused: No  Depression (PHQ2-9): Low Risk (07/26/2024)   Depression (PHQ2-9)    PHQ-2 Score: 0  Alcohol Screen: Low Risk (07/13/2024)   Alcohol Screen    Last Alcohol Screening Score (AUDIT): 0  Housing: Unknown (07/13/2024)   Epic    Unable to Pay for Housing in the Last Year: No    Number of Times Moved in the Last Year: Not on file    Homeless in the Last Year: No  Utilities: Not At Risk (07/13/2024)   Epic    Threatened with loss of utilities: No  Health Literacy: Adequate Health Literacy (07/13/2024)   B1300 Health Literacy    Frequency of need for help with medical instructions: Never    Review of Systems   Objective:   Vitals:   11/01/24 0931  BP: (!) 100/58  Pulse: 71  Resp: 14  Temp: 98.2 F (36.8 C)  TempSrc: Temporal  SpO2: 97%  Weight: 199 lb (90.3 kg)  Height: 5' 11 (1.803 m)     Physical Exam Vitals reviewed.  Constitutional:      Appearance: He is well-developed.  HENT:     Head: Normocephalic and atraumatic.  Neck:     Vascular: No carotid bruit or JVD.  Cardiovascular:     Rate and Rhythm:  Normal rate and regular rhythm.     Heart sounds: Normal heart sounds. No murmur heard. Pulmonary:     Effort: Pulmonary effort is normal.     Breath sounds: Normal breath sounds. No rales.  Abdominal:     General: Bowel sounds are normal. There is distension (protuberant. nontender .).     Tenderness: There is no abdominal tenderness. There is no guarding.  Musculoskeletal:     Right lower leg: No edema.     Left lower leg: No edema.  Skin:    General: Skin is warm and dry.  Neurological:     Mental Status: He is alert and oriented to person, place, and time.  Psychiatric:        Mood and Affect: Mood normal.        Assessment & Plan:  Peter Taylor is a 74 y.o. male . Type 2 diabetes mellitus without complication, without long-term current use of insulin (HCC) - Plan: Comprehensive metabolic panel with GFR, Hemoglobin A1c  Abdominal bloating - Plan: Ambulatory referral to Gastroenterology  Check updated A1c, adjust medications accordingly.  Would consider stopping metformin  altogether given continuous bloating after every meal but given the persistent bloating symptoms abdominal exam, will refer to gastroenterology.  Right upper quadrant ultrasound overall reassuring.  Plans Tdap and COVID booster at his pharmacy.  No orders of the defined types were placed in this encounter.  Patient Instructions  No change in meds for now.  We may need to stop metformin  and try something different for diabetes if that is contributing to some of the bowel changes, but I will also refer you to gastroenterology due to the persistent bloating symptoms.  I would like to see the results of your labs prior to changing medications.  If you have not heard from the gastroenterology office about appointment within the next week or 2 let me know.  Be seen if any new or worsening symptoms.  COVID booster and Tdap vaccines can be given at your pharmacy.  Let me know if there are questions and take  care!    Signed,  Reyes Pines, MD [facility], [facility] 11/01/2024 10:05 AM      [1]  Allergies Allergen Reactions   Penicillins Hives    Did it involve swelling of the face/tongue/throat, SOB, or low BP? No Did it involve sudden or severe rash/hives, skin peeling, or any reaction on the inside of your mouth or nose? No Did you need to seek medical attention at a hospital or doctor's office? No When did it last happen?      35+ years If all above answers are NO, may proceed with cephalosporin use.    "

## 2024-11-02 ENCOUNTER — Ambulatory Visit: Payer: Self-pay | Admitting: Family Medicine

## 2024-11-04 LAB — OPHTHALMOLOGY REPORT-SCANNED

## 2024-11-08 NOTE — Progress Notes (Signed)
 Lab results have been discussed.   Verbalized understanding? Yes  Are there any questions? No

## 2024-12-10 ENCOUNTER — Ambulatory Visit: Admitting: Nurse Practitioner

## 2025-02-02 ENCOUNTER — Ambulatory Visit: Admitting: Family Medicine

## 2025-07-19 ENCOUNTER — Ambulatory Visit
